# Patient Record
Sex: Male | Born: 1960
Health system: Southern US, Community
[De-identification: ages and names within clinical notes are randomized; demographics above are authoritative.]

## PROBLEM LIST (undated history)

## (undated) DIAGNOSIS — K219 Gastro-esophageal reflux disease without esophagitis: Secondary | ICD-10-CM

## (undated) DIAGNOSIS — H6502 Acute serous otitis media, left ear: Secondary | ICD-10-CM

## (undated) DIAGNOSIS — K222 Esophageal obstruction: Secondary | ICD-10-CM

## (undated) DIAGNOSIS — L409 Psoriasis, unspecified: Secondary | ICD-10-CM

## (undated) DIAGNOSIS — L408 Other psoriasis: Secondary | ICD-10-CM

## (undated) DIAGNOSIS — H6990 Unspecified Eustachian tube disorder, unspecified ear: Secondary | ICD-10-CM

## (undated) DIAGNOSIS — I10 Essential (primary) hypertension: Secondary | ICD-10-CM

## (undated) HISTORY — DX: Essential (primary) hypertension: I10

## (undated) HISTORY — DX: Psoriasis, unspecified: L40.9

## (undated) HISTORY — DX: Unspecified eustachian tube disorder, unspecified ear: H69.90

## (undated) HISTORY — DX: Other psoriasis: L40.8

## (undated) HISTORY — DX: Acute serous otitis media, left ear: H65.02

## (undated) HISTORY — DX: Gastro-esophageal reflux disease without esophagitis: K21.9

## (undated) HISTORY — PX: OTHER SURGICAL HISTORY: SHX169

## (undated) HISTORY — DX: Esophageal obstruction: K22.2

---

## 1999-05-07 ENCOUNTER — Emergency Department (HOSPITAL_COMMUNITY): Admission: EM | Admit: 1999-05-07 | Discharge: 1999-05-07 | Payer: Self-pay | Admitting: Emergency Medicine

## 1999-05-07 ENCOUNTER — Encounter: Payer: Self-pay | Admitting: Emergency Medicine

## 2001-06-27 DIAGNOSIS — K222 Esophageal obstruction: Secondary | ICD-10-CM

## 2001-06-27 HISTORY — DX: Esophageal obstruction: K22.2

## 2001-07-04 ENCOUNTER — Encounter: Payer: Self-pay | Admitting: Gastroenterology

## 2005-12-14 ENCOUNTER — Ambulatory Visit: Payer: Self-pay | Admitting: Internal Medicine

## 2005-12-20 ENCOUNTER — Ambulatory Visit: Payer: Self-pay | Admitting: Internal Medicine

## 2006-01-19 ENCOUNTER — Ambulatory Visit: Payer: Self-pay | Admitting: Internal Medicine

## 2006-02-21 ENCOUNTER — Ambulatory Visit: Payer: Self-pay | Admitting: Internal Medicine

## 2006-03-20 ENCOUNTER — Ambulatory Visit: Payer: Self-pay | Admitting: Internal Medicine

## 2006-07-19 ENCOUNTER — Ambulatory Visit: Payer: Self-pay | Admitting: Internal Medicine

## 2006-10-16 DIAGNOSIS — L408 Other psoriasis: Secondary | ICD-10-CM

## 2006-10-16 DIAGNOSIS — I10 Essential (primary) hypertension: Secondary | ICD-10-CM | POA: Insufficient documentation

## 2006-10-16 DIAGNOSIS — M109 Gout, unspecified: Secondary | ICD-10-CM | POA: Insufficient documentation

## 2006-10-16 HISTORY — DX: Other psoriasis: L40.8

## 2006-10-18 ENCOUNTER — Encounter: Payer: Self-pay | Admitting: Internal Medicine

## 2006-10-18 ENCOUNTER — Ambulatory Visit: Payer: Self-pay | Admitting: Internal Medicine

## 2006-10-18 DIAGNOSIS — K219 Gastro-esophageal reflux disease without esophagitis: Secondary | ICD-10-CM | POA: Insufficient documentation

## 2006-10-18 LAB — CONVERTED CEMR LAB
ALT: 32 units/L (ref 0–40)
AST: 23 units/L (ref 0–37)
BUN: 14 mg/dL (ref 6–23)
CO2: 28 meq/L (ref 19–32)
Calcium: 9 mg/dL (ref 8.4–10.5)
Chloride: 104 meq/L (ref 96–112)
Creatinine, Ser: 0.9 mg/dL (ref 0.4–1.5)
GFR calc Af Amer: 118 mL/min
GFR calc non Af Amer: 97 mL/min
Glucose, Bld: 111 mg/dL — ABNORMAL HIGH (ref 70–99)
Potassium: 4.2 meq/L (ref 3.5–5.1)
Sodium: 140 meq/L (ref 135–145)
Uric Acid, Serum: 7 mg/dL (ref 2.4–7.0)

## 2007-03-07 ENCOUNTER — Ambulatory Visit: Payer: Self-pay | Admitting: Internal Medicine

## 2007-06-11 ENCOUNTER — Emergency Department (HOSPITAL_COMMUNITY): Admission: EM | Admit: 2007-06-11 | Discharge: 2007-06-11 | Payer: Self-pay | Admitting: Family Medicine

## 2007-07-26 ENCOUNTER — Encounter: Payer: Self-pay | Admitting: Internal Medicine

## 2007-08-23 ENCOUNTER — Telehealth: Payer: Self-pay | Admitting: Internal Medicine

## 2007-09-05 ENCOUNTER — Ambulatory Visit: Payer: Self-pay | Admitting: Internal Medicine

## 2007-09-19 ENCOUNTER — Ambulatory Visit: Payer: Self-pay | Admitting: Internal Medicine

## 2007-09-24 LAB — CONVERTED CEMR LAB
ALT: 36 units/L (ref 0–53)
AST: 22 units/L (ref 0–37)
Albumin: 4 g/dL (ref 3.5–5.2)
Alkaline Phosphatase: 41 units/L (ref 39–117)
BUN: 12 mg/dL (ref 6–23)
Bilirubin, Direct: 0.1 mg/dL (ref 0.0–0.3)
CO2: 29 meq/L (ref 19–32)
Calcium: 9.1 mg/dL (ref 8.4–10.5)
Chloride: 105 meq/L (ref 96–112)
Cholesterol: 215 mg/dL (ref 0–200)
Creatinine, Ser: 0.9 mg/dL (ref 0.4–1.5)
Direct LDL: 127.7 mg/dL
GFR calc Af Amer: 117 mL/min
GFR calc non Af Amer: 97 mL/min
Glucose, Bld: 112 mg/dL — ABNORMAL HIGH (ref 70–99)
HDL: 45.1 mg/dL (ref >39.0)
Potassium: 4.3 meq/L (ref 3.5–5.1)
Sodium: 138 meq/L (ref 135–145)
Total Bilirubin: 0.6 mg/dL (ref 0.3–1.2)
Total CHOL/HDL Ratio: 4.8
Total Protein: 6.6 g/dL (ref 6.0–8.3)
Triglycerides: 158 mg/dL — ABNORMAL HIGH (ref 0–149)
Uric Acid, Serum: 6.9 mg/dL (ref 2.4–7.0)
VLDL: 32 mg/dL (ref 0–40)

## 2008-01-02 ENCOUNTER — Ambulatory Visit: Payer: Self-pay | Admitting: Internal Medicine

## 2008-01-03 ENCOUNTER — Encounter (INDEPENDENT_AMBULATORY_CARE_PROVIDER_SITE_OTHER): Payer: Self-pay | Admitting: *Deleted

## 2008-01-22 ENCOUNTER — Telehealth (INDEPENDENT_AMBULATORY_CARE_PROVIDER_SITE_OTHER): Payer: Self-pay | Admitting: *Deleted

## 2008-01-22 ENCOUNTER — Encounter: Payer: Self-pay | Admitting: Internal Medicine

## 2008-01-25 DIAGNOSIS — K222 Esophageal obstruction: Secondary | ICD-10-CM | POA: Insufficient documentation

## 2008-01-28 ENCOUNTER — Ambulatory Visit: Payer: Self-pay | Admitting: Gastroenterology

## 2008-05-19 ENCOUNTER — Telehealth (INDEPENDENT_AMBULATORY_CARE_PROVIDER_SITE_OTHER): Payer: Self-pay | Admitting: *Deleted

## 2008-05-19 ENCOUNTER — Telehealth: Payer: Self-pay | Admitting: Internal Medicine

## 2008-10-17 ENCOUNTER — Telehealth (INDEPENDENT_AMBULATORY_CARE_PROVIDER_SITE_OTHER): Payer: Self-pay | Admitting: *Deleted

## 2008-10-24 ENCOUNTER — Encounter: Payer: Self-pay | Admitting: Internal Medicine

## 2008-10-24 ENCOUNTER — Inpatient Hospital Stay (HOSPITAL_COMMUNITY): Admission: EM | Admit: 2008-10-24 | Discharge: 2008-10-25 | Payer: Self-pay | Admitting: Emergency Medicine

## 2008-10-24 ENCOUNTER — Ambulatory Visit: Payer: Self-pay | Admitting: Internal Medicine

## 2008-10-24 DIAGNOSIS — R079 Chest pain, unspecified: Secondary | ICD-10-CM | POA: Insufficient documentation

## 2008-10-27 ENCOUNTER — Encounter: Payer: Self-pay | Admitting: Internal Medicine

## 2008-11-06 ENCOUNTER — Telehealth (INDEPENDENT_AMBULATORY_CARE_PROVIDER_SITE_OTHER): Payer: Self-pay | Admitting: *Deleted

## 2008-11-12 ENCOUNTER — Ambulatory Visit: Payer: Self-pay | Admitting: Gastroenterology

## 2009-01-19 ENCOUNTER — Telehealth (INDEPENDENT_AMBULATORY_CARE_PROVIDER_SITE_OTHER): Payer: Self-pay | Admitting: *Deleted

## 2009-01-28 ENCOUNTER — Telehealth (INDEPENDENT_AMBULATORY_CARE_PROVIDER_SITE_OTHER): Payer: Self-pay | Admitting: *Deleted

## 2009-02-04 ENCOUNTER — Telehealth: Payer: Self-pay | Admitting: Internal Medicine

## 2010-02-24 ENCOUNTER — Ambulatory Visit: Payer: Self-pay | Admitting: Internal Medicine

## 2010-03-04 ENCOUNTER — Telehealth: Payer: Self-pay | Admitting: Internal Medicine

## 2010-03-04 LAB — CONVERTED CEMR LAB
ALT: 29 units/L (ref 0–53)
AST: 21 units/L (ref 0–37)
Albumin: 4 g/dL (ref 3.5–5.2)
Alkaline Phosphatase: 51 units/L (ref 39–117)
BUN: 13 mg/dL (ref 6–23)
Basophils Absolute: 0.1 10*3/uL (ref 0.0–0.1)
Basophils Relative: 0.8 % (ref 0.0–3.0)
Bilirubin, Direct: 0.1 mg/dL (ref 0.0–0.3)
CO2: 28 meq/L (ref 19–32)
Calcium: 9.2 mg/dL (ref 8.4–10.5)
Chloride: 104 meq/L (ref 96–112)
Cholesterol: 233 mg/dL — ABNORMAL HIGH (ref 0–200)
Creatinine, Ser: 0.8 mg/dL (ref 0.4–1.5)
Direct LDL: 136 mg/dL
Eosinophils Absolute: 0.2 10*3/uL (ref 0.0–0.7)
Eosinophils Relative: 2.6 % (ref 0.0–5.0)
GFR calc non Af Amer: 114.71 mL/min (ref >60)
Glucose, Bld: 121 mg/dL — ABNORMAL HIGH (ref 70–99)
HCT: 38.4 % — ABNORMAL LOW (ref 39.0–52.0)
HDL: 50 mg/dL (ref >39.00)
Hemoglobin: 13.4 g/dL (ref 13.0–17.0)
Lymphocytes Relative: 26.4 % (ref 12.0–46.0)
Lymphs Abs: 1.7 10*3/uL (ref 0.7–4.0)
MCHC: 34.9 g/dL (ref 30.0–36.0)
MCV: 91.8 fL (ref 78.0–100.0)
Monocytes Absolute: 0.5 10*3/uL (ref 0.1–1.0)
Monocytes Relative: 7 % (ref 3.0–12.0)
Neutro Abs: 4.2 10*3/uL (ref 1.4–7.7)
Neutrophils Relative %: 63.2 % (ref 43.0–77.0)
Platelets: 238 10*3/uL (ref 150.0–400.0)
Potassium: 4.7 meq/L (ref 3.5–5.1)
RBC: 4.18 M/uL — ABNORMAL LOW (ref 4.22–5.81)
RDW: 12.5 % (ref 11.5–14.6)
Sodium: 141 meq/L (ref 135–145)
TSH: 1.29 microintl units/mL (ref 0.35–5.50)
Total Bilirubin: 0.3 mg/dL (ref 0.3–1.2)
Total CHOL/HDL Ratio: 5
Total Protein: 6.9 g/dL (ref 6.0–8.3)
Triglycerides: 271 mg/dL — ABNORMAL HIGH (ref 0.0–149.0)
Uric Acid, Serum: 8.2 mg/dL — ABNORMAL HIGH (ref 4.0–7.8)
VLDL: 54.2 mg/dL — ABNORMAL HIGH (ref 0.0–40.0)
WBC: 6.6 10*3/uL (ref 4.5–10.5)

## 2010-03-12 ENCOUNTER — Encounter (INDEPENDENT_AMBULATORY_CARE_PROVIDER_SITE_OTHER): Payer: Self-pay | Admitting: *Deleted

## 2010-04-27 ENCOUNTER — Telehealth (INDEPENDENT_AMBULATORY_CARE_PROVIDER_SITE_OTHER): Payer: Self-pay | Admitting: *Deleted

## 2010-05-05 IMAGING — CT CT ANGIO CHEST
2 of 6 series · 19 of 36 positions shown · IV contrast (APPLIED)
Comparison: None.

CLINICAL DATA: Chest pain.

CT ANGIOGRAPHY CHEST WITH CONTRAST
TECHNIQUE: Multidetector CT imaging of the chest was performed
using the standard protocol during bolus administration of
intravenous contrast. Multiplanar CT image reconstructions
including MIPs were obtained to evaluate the vascular anatomy.
Contrast: 100 ml Zmnipaque-KHH

[Series 8: pulm embolism 1.0 b25f thins · axial · 0.70mm/px · z∈[-306,-43]mm · 18 of 293 slices shown]
[im 15/293  lung]
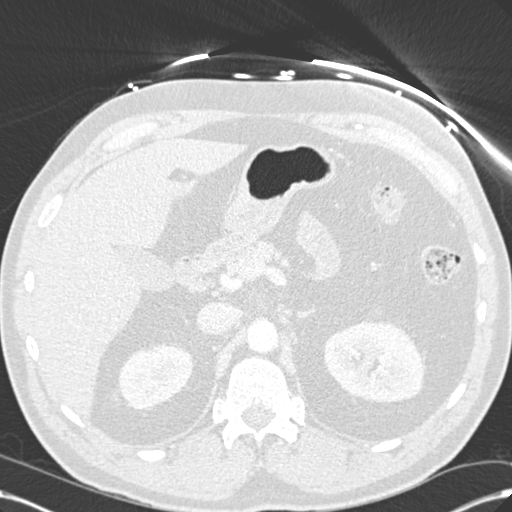
[im 30/293  mediastinal]
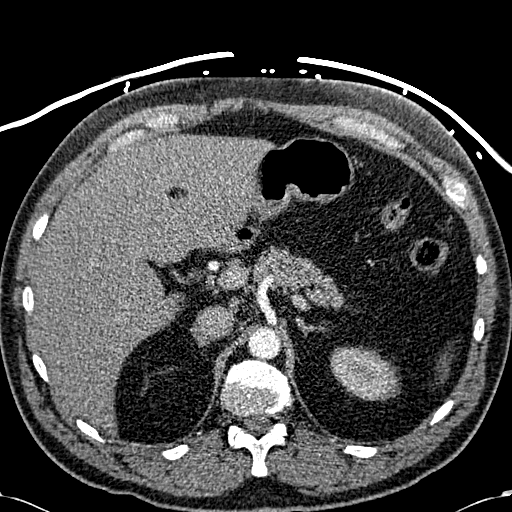
[im 44/293  lung]
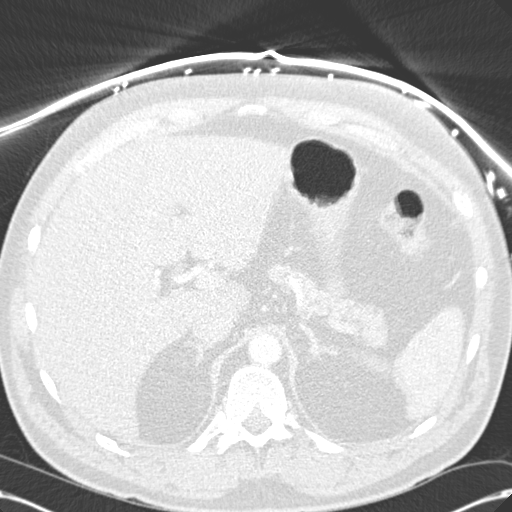
[im 59/293  mediastinal]
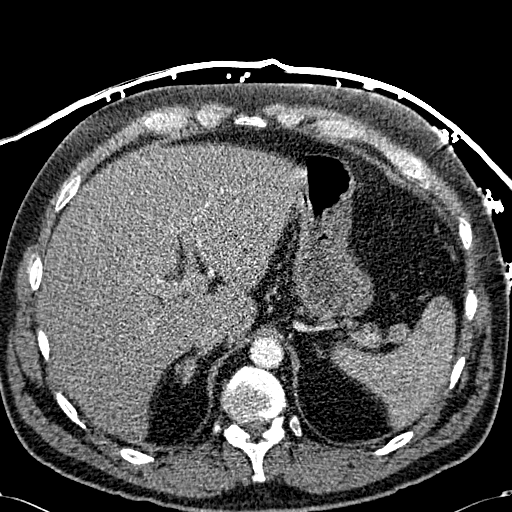
[im 74/293  lung]
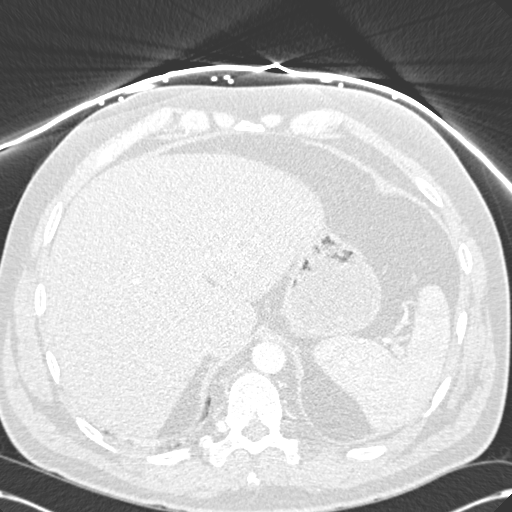
[im 88/293  mediastinal]
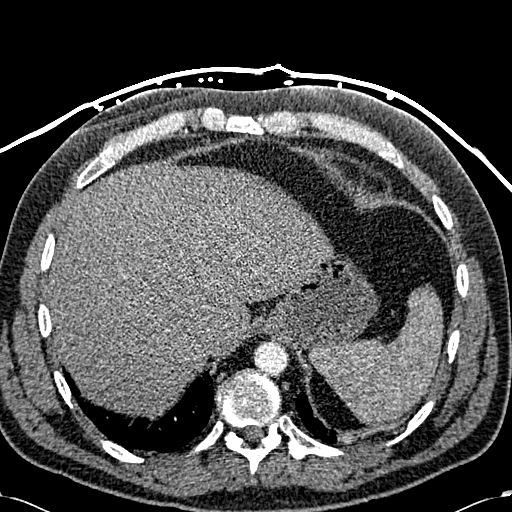
[im 103/293  lung]
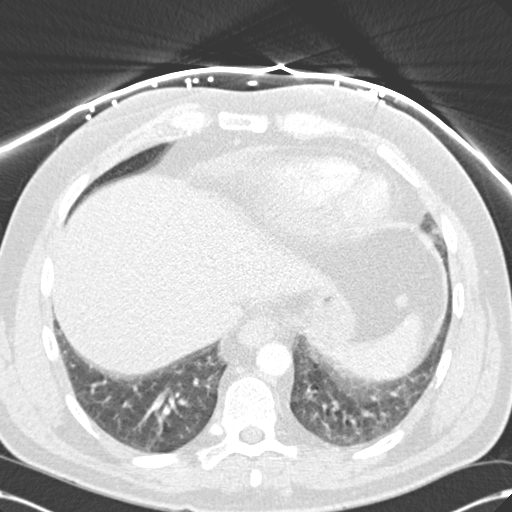
[im 117/293  mediastinal]
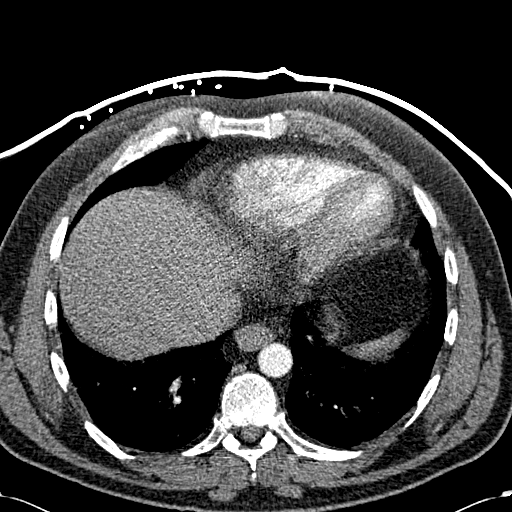
[im 132/293  lung]
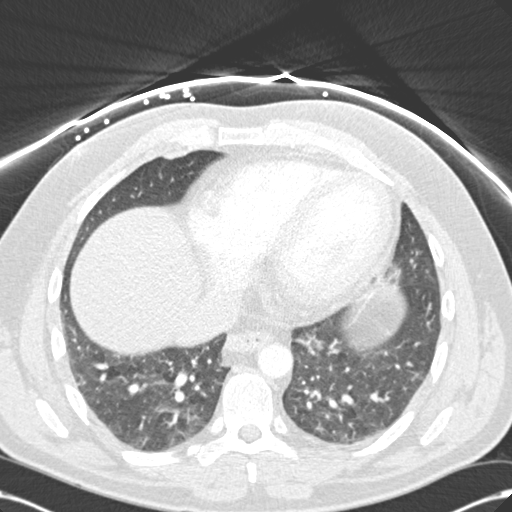
[im 161/293  mediastinal]
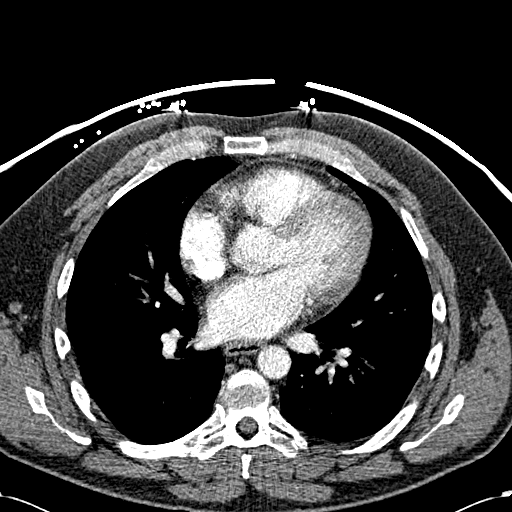
[im 176/293  lung]
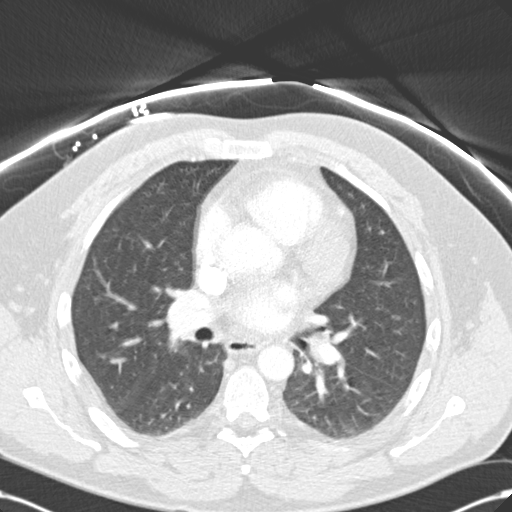
[im 190/293  mediastinal]
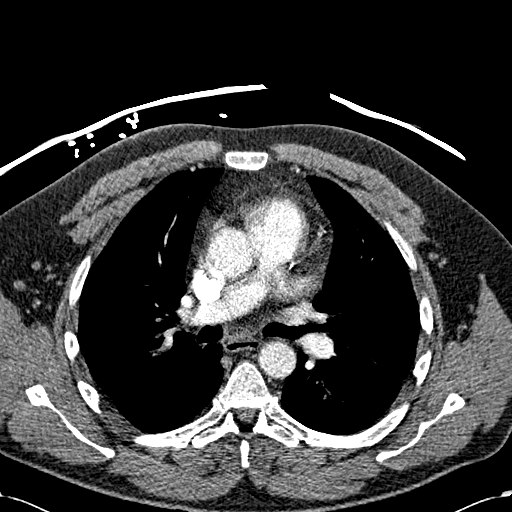
[im 205/293  lung]
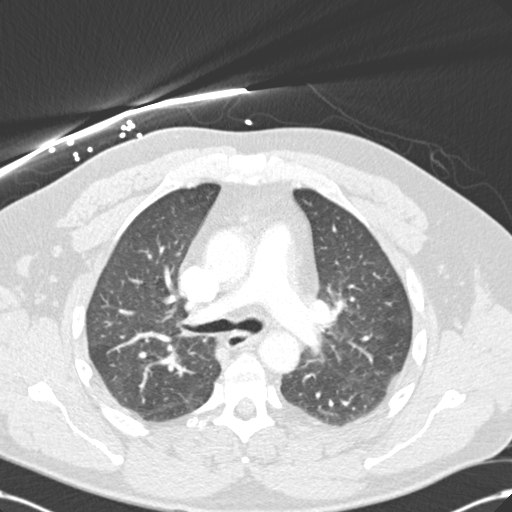
[im 220/293  mediastinal]
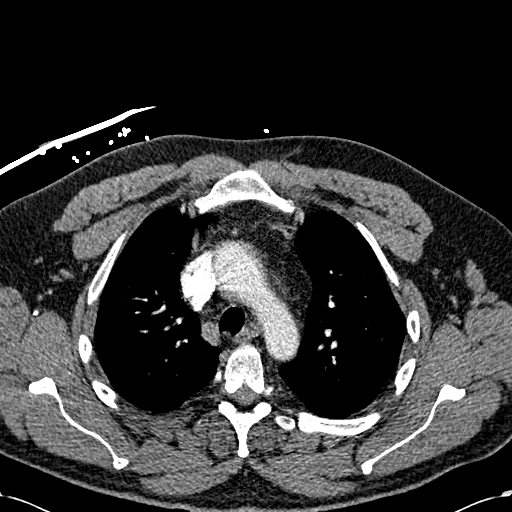
[im 234/293  lung]
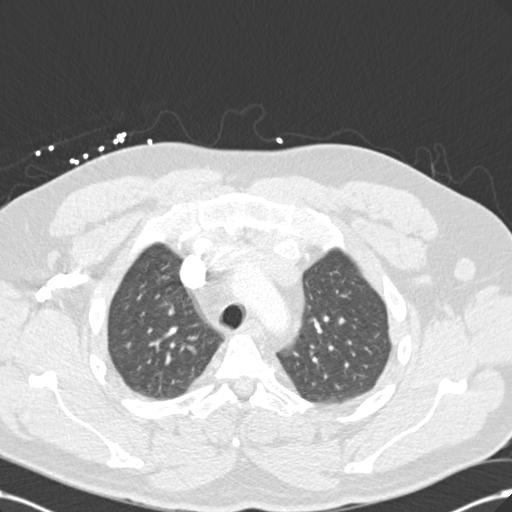
[im 249/293  mediastinal]
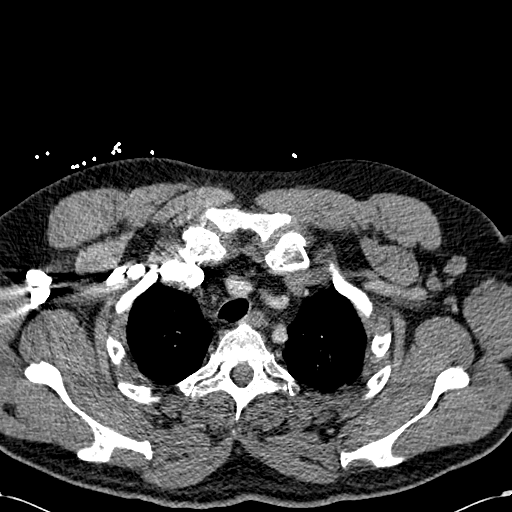
[im 263/293  lung]
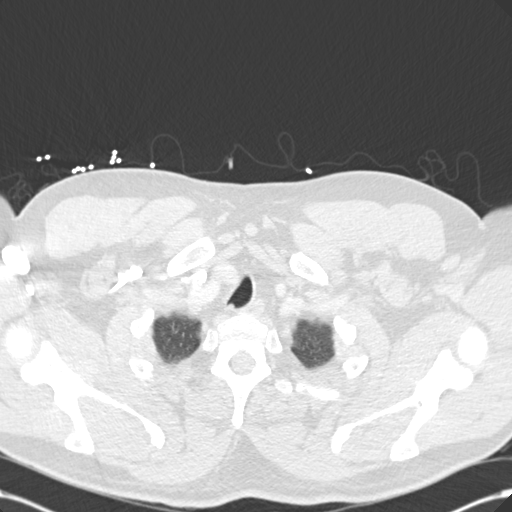
[im 278/293  mediastinal]
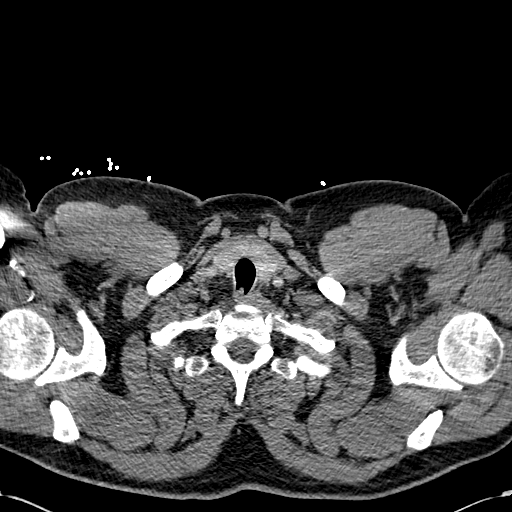

[Series 9: pulm embolism 3.0 spo cor thins · coronal · 0.70mm/px · 1 of 80 slices shown]
[im 40/80  mediastinal]
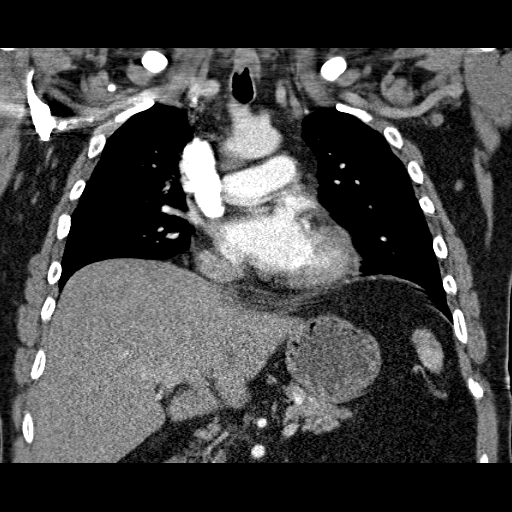

[19 of 36 positions shown; findings below may reference images not displayed]

FINDINGS: There is no filling defect within the opacified
pulmonary arteries to suggest the presence of an acute pulmonary
embolus.  There is no thoracic aortic aneurysm.  No dissection flap
is seen within the thoracic aorta.

No lymphadenopathy in the chest.  Heart is at upper limits of
normal for size.  There is no pericardial or pleural effusion.
Lung windows show no focal airspace consolidation.  There is no
parenchymal nodule or mass.

Bone windows reveal no worrisome lytic or sclerotic osseous
lesions.

Review of the MIP images confirms the above findings.
IMPRESSION: No CT evidence for acute pulmonary embolus.

No CT findings to explain this patient's history of chest pain.

## 2010-06-02 ENCOUNTER — Telehealth (INDEPENDENT_AMBULATORY_CARE_PROVIDER_SITE_OTHER): Payer: Self-pay | Admitting: *Deleted

## 2010-06-09 ENCOUNTER — Ambulatory Visit: Payer: Self-pay | Admitting: Internal Medicine

## 2010-06-11 LAB — CONVERTED CEMR LAB
Cholesterol: 226 mg/dL — ABNORMAL HIGH (ref 0–200)
Total CHOL/HDL Ratio: 5
Triglycerides: 194 mg/dL — ABNORMAL HIGH (ref 0.0–149.0)

## 2010-07-20 ENCOUNTER — Telehealth: Payer: Self-pay | Admitting: Internal Medicine

## 2010-07-27 NOTE — Progress Notes (Signed)
Summary: refill  Phone Note Refill Request Message from:  Fax from Pharmacy on April 27, 2010 9:05 AM  Refills Requested: Medication #1:  METOPROLOL TARTRATE 100 MG TABS one by mouth daily [facility] [address] - fax [phone]  Initial call taken by: Okey Regal Spring,  April 27, 2010 9:05 AM    Prescriptions: METOPROLOL TARTRATE 100 MG TABS (METOPROLOL TARTRATE) one by mouth daily  #90 x 1   Entered by:   Army Fossa CMA   Authorized by:   Nolon Rod. Paz MD   Signed by:   Army Fossa CMA on 04/27/2010   Method used:   [id number]       [address]       [place], [place]  [address]       Ph: [phone] or [phone]       Fax: [phone]   RxID:   [id number]

## 2010-07-27 NOTE — Progress Notes (Signed)
Summary: lab results (lmom 9/8, 9/9)  Phone Note Outgoing Call   Summary of Call: advised patient -uric acid ("gout test") slightly high but he has no problems, continue with the same dose of allopurinol -triglycerides and blood sugar are elevated: recommended a low fat, low carbohydrate diet. Send some information about diet. -needs to recheck  fasting labs in 4 weeks: FLP , blood sugar , A1c--- DX hypertriglyceridemia, hyperglycemia Allissa Albright E. Neil Errickson MD  March 04, 2010 8:24 AM   Follow-up for Phone Call        left message for pt to call back. Army Fossa CMA  March 04, 2010 11:14 AM  [email or web address] Barban  March 05, 2010 11:33 AM  If unable to Communicate next week, please send a letter with all the details Waller Marcussen E. Yevette Knust MD  March 05, 2010 1:41 PM     Additional Follow-up for Phone Call Additional follow up Details #1::        Will mail pt information and letter. Army Fossa CMA  March 12, 2010 10:14 AM

## 2010-07-27 NOTE — Letter (Signed)
Summary: Unable To Reach-Consult Scheduled  [facility]  [address] [place], [place] [address]   Phone: [phone]  Fax: [phone]    03/12/2010 MRN: [id number]    Dear Mr. PRATHER,   We have been unable to reach you by phone.  Please contact our office with an updated phone number.       Thank you,  Army Fossa CMA  March 12, 2010 10:15 AM

## 2010-07-27 NOTE — Assessment & Plan Note (Signed)
Summary: CPX/FASTING//KN/ rescd/cbs   Vital Signs:  Patient profile:   50 year old male Height:      73 inches Weight:      240.13 pounds BMI:     31.80 Pulse rate:   71 / minute Pulse rhythm:   regular BP sitting:   136 / 80  (left arm) Cuff size:   regular  Vitals Entered By: Army Fossa CMA (February 24, 2010 9:20 AM) CC: CPX: fasting   History of Present Illness: CPX last office visit 2 years ago Doing well Hypertension-one out of the hospital 2 months ago, taking metooprolol only once a day. ambulatory BPs within normal gout -taking only one allopurinol , no flareups GERD-discontinue Nexium, on Prilosec p.r.n. Symptoms well controlled, no dysphagia or odynophagia patient has changed his lifestyle, eating healthier, avoiding foods that cause GERD.  Preventive Screening-Counseling & Management  Alcohol-Tobacco     Alcohol drinks/day: <1  Caffeine-Diet-Exercise     Does Patient Exercise: yes  Allergies (verified): No Known Drug Allergies  Past History:  Past Medical History: Reviewed history from 10/24/2008 and no changes required. GERD Gout Hypertension Psoriasis Esophageal Stricture-2003 s/p dilation  Past Surgical History: s/p nasal surgury after facial trauma  Family History: Reviewed history from 09/05/2007 and no changes required. Diabetes--F M Hypertension--F M Lung cancer--GM Melanoma--GF colon ca--no prostate ca--no MI--no  Social History: Reviewed history from 11/12/2008 and no changes required. Married one child work - self employed - Dealer business Never Smoked Alcohol use-yes - 3 days a week  diet-- improved  exercise -- doing some walking  Daily Caffeine Use-2 Illicit Drug Use - no Does Patient Exercise:  yes  Review of Systems General:  Denies fatigue, fever, and weight loss. CV:  Denies chest pain or discomfort, palpitations, and swelling of feet. Resp:  Denies cough and shortness of breath. GI:  Denies bloody  stools, diarrhea, nausea, and vomiting. GU:  Denies dysuria and hematuria. Psych:  Denies anxiety and depression.  Physical Exam  General:  alert and well-developed.   Neck:  no masses, no thyromegaly, and normal carotid upstroke.   Lungs:  normal respiratory effort, no intercostal retractions, no accessory muscle use, and normal breath sounds.   Heart:  normal rate, regular rhythm, no murmur, and no gallop.   Abdomen:  soft, non-tender, normal bowel sounds, no distention, no masses, no guarding, and no rigidity.   Extremities:  no lower extremity edema Neurologic:  alert & oriented X3, strength normal in all extremities, and DTRs symmetrical and normal.   Psych:  Cognition and judgment appear intact. Alert and cooperative with normal attention span and concentration.  not anxious appearing and not depressed appearing.     Impression & Recommendations:  Problem # 1:  HEALTH SCREENING (ICD-V70.0) TD 07 doing great, diet has improved substantially, has decreased his alcohol intake encourage more exercise   Orders: Venipuncture (16109) TLB-BMP (Basic Metabolic Panel-BMET) (80048-METABOL) TLB-CBC Platelet - w/Differential (85025-CBCD) TLB-Lipid Panel (80061-LIPID) TLB-Hepatic/Liver Function Pnl (80076-HEPATIC) TLB-TSH (Thyroid Stimulating Hormone) (84443-TSH) Specimen Handling (60454)  Problem # 2:  GOUT (ICD-274.9) well control with a low dose of allopurinol, likely because he has improved his diet. No change  The following medications were removed from the medication list:    Colchicine 0.6 Mg Tabs (Colchicine) .[name]... 1 by mouth q 4 hours prn His updated medication list for this problem includes:    Allopurinol 100 Mg Tabs (Allopurinol) .[name]... 1 tablet daily    Orders: TLB-Uric Acid, Blood (84550-URIC)  Specimen Handling (19147)  Problem # 3:  HYPERTENSION (ICD-401.9) his BP is well controlled despite not taking benazepril  for 2 months he is only taking one metoprolol at  night with good ambulatory BPs, when she was taking it b.i.d. he felt sluggish We'll continue with present care, advised him to monitor his BP to be sure he is stable with single dose of metoprolol   The following medications were removed from the medication list:    Benazepril Hcl 20 Mg Tabs (Benazepril hcl) .[name]... Take one tablet once daily. His updated medication list for this problem includes:    Metoprolol Tartrate 100 Mg Tabs (Metoprolol tartrate) ..... One by mouth daily  BP today: 136/80 Prior BP: 132/88 (11/12/2008)  Labs Reviewed: K+: 4.3 (09/19/2007) Creat: : 0.9 (09/19/2007)   Chol: 215 (09/19/2007)   HDL: 45.1 (09/19/2007)   LDL: DEL (09/19/2007)   TG: 158 (09/19/2007)  Problem # 4:  GERD (ICD-530.81) Will control with as needed Prilosec His updated medication list for this problem includes:    Prilosec Otc 20 Mg Tbec (Omeprazole magnesium) .[name]... 1 as needed  Complete Medication List: 1)  Allopurinol 100 Mg Tabs (Allopurinol) .[name].. 1 tablet daily 2)  Metoprolol Tartrate 100 Mg Tabs (Metoprolol tartrate) .... One by mouth daily 3)  Prilosec Otc 20 Mg Tbec (Omeprazole magnesium) .[name].. 1 as needed  Other Orders: Admin 1st Vaccine (82956) Flu Vaccine 78yrs + (21308)  Patient Instructions: 1)  Check your blood pressure 2 or 3 times a week. If it is more than 140/85 consistently,please let us know  2)  Please schedule a follow-up appointment in 6 months .  Prescriptions: METOPROLOL TARTRATE 100 MG TABS (METOPROLOL TARTRATE) one by mouth daily  #90 x 2   Entered and Authorized by:   Nolon Rod. Avram Danielson MD   Signed by:   Nolon Rod. Hashem Goynes MD on 02/24/2010   Method used:   [facility]        [id number]       [address]       [place], [place]  [address]       Ph: [phone] or [phone]       Fax: [phone]   RxID:   [id number] ALLOPURINOL 100 MG TABS (ALLOPURINOL) 1 tablet daily  #90 x 2   Entered and Authorized by:   Nolon Rod. Graysen Woodyard MD   Signed by:   Nolon Rod. Mishayla Sliwinski MD on 02/24/2010   Method used:   [facility]        [facility]       [address]       [place], [place]  [address]       Ph: [phone] or [phone]       Fax: [phone]   RxID:   [id number] NEXIUM 40 MG  CPDR (ESOMEPRAZOLE MAGNESIUM) 1 by mouth once daily  #90 x 3   Entered by:   Army Fossa CMA   Authorized by:   Nolon Rod. Ryonna Cimini MD   Signed by:   Army Fossa CMA on 02/24/2010   Method used:   [facility]        [facility]       [address]       [place], [place]  [address]  Ph: [phone] or [phone]       Fax: [phone]   RxID:   [id number] METOPROLOL TARTRATE 100 MG TABS (METOPROLOL TARTRATE) bid  #60 x 1   Entered by:   Army Fossa CMA   Authorized by:   Nolon Rod. Kamica Florance MD   Signed by:   Army Fossa CMA on 02/24/2010   Method used:   [facility]        [facility]       [address]       [place], [place]  [address]       Ph: [phone] or [phone]       Fax: [phone]   RxID:   [id number] BENAZEPRIL HCL 20 MG TABS (BENAZEPRIL HCL) Take one tablet once daily.  #90 x 1   Entered by:   Army Fossa CMA   Authorized by:   Nolon Rod. Shiryl Ruddy MD   Signed by:   Army Fossa CMA on 02/24/2010   Method used:   [facility]        [facility]       [address]       [place], [place]  [address]       Ph: [phone] or [phone]       Fax: [phone]   RxID:   [id number] ALLOPURINOL 100 MG TABS (ALLOPURINOL) 1 tablet by mouth twice a day  #180 x 1   Entered by:   Army Fossa CMA   Authorized by:   Nolon Rod. Apphia Cropley MD   Signed by:   Army Fossa CMA on 02/24/2010   Method used:   [facility]        [facility]       [address]       [place], [place]  [address]       Ph: [phone] or [phone]       Fax: [phone]   RxID:   [id number]    Risk Factors:     Drinks per day:  <1 Exercise:  yes Flu Vaccine Consent Questions     Do you have a history of severe allergic reactions to this vaccine? no    Any prior history of allergic reactions to egg and/or gelatin? no    Do you have a sensitivity to the preservative Thimersol? no    Do you have a past history of Guillan-Barre Syndrome? no    Do you currently have an acute febrile illness? no    Have you ever had a severe reaction to latex? no    Vaccine information given and explained to patient? yes    Are you currently pregnant? no    Lot Number:[id number]   Exp Date:12/25/2010   Site Given  Left Deltoid IMks per day:  <1 Exercise:  yes    .lbflu

## 2010-07-29 NOTE — Progress Notes (Signed)
Summary: needs labs, no further refills--need to verify lab orders  Phone Note Outgoing Call   Summary of Call: was supposed to came back for labs please call one more time or send a letter. No further refills without labs or office visit Jose E. Paz MD  June 02, 2010 1:00 PM    Follow-up for Phone Call        I was having problems with EMR and accidently erased the 2 labs that Dr Drue Novel had entered for this patient--Is the info listed below the correct labs??  He is scheduled to come in on 12/14 for fasting labs.  FLP , blood sugar , A1c--- DX hypertriglyceridemia, hyperglycemia Follow-up by: Jerolyn Shin,  June 04, 2010 12:06 PM  Additional Follow-up for Phone Call Additional follow up Details #1::        I sent you a flag. Lucious Groves CMA  June 04, 2010 1:09 PM     Additional Follow-up for Phone Call Additional follow up Details #2::    As I recall, your flag asked me to combine the two open notes--which I did  but I still don't have a clarification whether labs that were accidentally erased have been entered correctly.  Thanks.Marland KitchenMarland KitchenJerolyn Shin  June 08, 2010 9:09 AM  Per Tresa Endo, referred me to 03/04/2010 labs phone note Follow-up by: Jerolyn Shin,  June 08, 2010 9:16 AM

## 2010-07-29 NOTE — Progress Notes (Signed)
Summary: Refill Request  Phone Note Refill Request Call back at [phone] Message from:  Pharmacy on July 20, 2010 10:26 AM  Refills Requested: Medication #1:  METFORMIN HCL 500 MG TABS one by mouth twice a day for 2 weeks   Dosage confirmed as above?Dosage Confirmed   Supply Requested: 120   Last Refilled: 06/11/2010  Medication #2:  Benazepril HCL 20mg  tab, take one tablet once daily   Supply Requested: 90   [facility] [address]   Next Appointment Scheduled: 3.1.12 Initial call taken by: Harold Barban,  July 20, 2010 10:27 AM  Follow-up for Phone Call        Left message for pt to call back-- need to know if pt is taking Benazepril? Follow-up by: Army Fossa CMA,  July 20, 2010 10:45 AM  Additional Follow-up for Phone Call Additional follow up Details #1::        I spoke with the patient and he confirmed that he is taking Benazepril. Additional Follow-up by: Lucious Groves CMA,  July 20, 2010 11:55 AM    New/Updated Medications: METOPROLOL TARTRATE 100 MG TABS (METOPROLOL TARTRATE) one by mouth daily. NEEDS OFFICE VISIT. BENAZEPRIL HCL 20 MG TABS (BENAZEPRIL HCL) 1 by mouth once daily Prescriptions: BENAZEPRIL HCL 20 MG TABS (BENAZEPRIL HCL) 1 by mouth once daily  #30 x 2   Entered by:   Lucious Groves CMA   Authorized by:   Nolon Rod. Adrain Nesbit MD   Signed by:   Lucious Groves CMA on 07/20/2010   Method used:   [facility] to        [facility]  [address]       [address]       [place], [place]  [address]       Ph: [phone] or [phone]       Fax: [phone]   RxID:   [id number] METFORMIN HCL 500 MG TABS (METFORMIN HCL) one by mouth twice a day for 2 weeks, then 2 by mouth twice a day.  #180 x 0   Entered by:   Army Fossa CMA   Authorized by:   Nolon Rod. Madelin Weseman MD   Signed by:   Army Fossa CMA on 07/20/2010   Method used:   [facility]ically to        [facility]  [address]  [address]       [place], [place]  [address]       Ph: [phone] or [phone]       Fax: [phone]   RxID:   [id number]

## 2010-08-26 ENCOUNTER — Encounter: Payer: Self-pay | Admitting: Internal Medicine

## 2010-08-26 ENCOUNTER — Ambulatory Visit (INDEPENDENT_AMBULATORY_CARE_PROVIDER_SITE_OTHER): Payer: BC Managed Care – PPO | Admitting: Internal Medicine

## 2010-08-26 ENCOUNTER — Other Ambulatory Visit: Payer: Self-pay | Admitting: Internal Medicine

## 2010-08-26 DIAGNOSIS — E119 Type 2 diabetes mellitus without complications: Secondary | ICD-10-CM | POA: Insufficient documentation

## 2010-08-26 LAB — AST: AST: 23 U/L (ref 0–37)

## 2010-08-26 LAB — LIPID PANEL
Cholesterol: 199 mg/dL (ref 0–200)
HDL: 47.4 mg/dL (ref >39.00)
Triglycerides: 167 mg/dL — ABNORMAL HIGH (ref 0.0–149.0)

## 2010-08-26 LAB — ALT: ALT: 34 U/L (ref 0–53)

## 2010-08-31 ENCOUNTER — Telehealth (INDEPENDENT_AMBULATORY_CARE_PROVIDER_SITE_OTHER): Payer: Self-pay | Admitting: *Deleted

## 2010-09-02 NOTE — Assessment & Plan Note (Signed)
Summary: 6 MONTH/CBS   Vital Signs:  Patient profile:   50 year old male Weight:      227.50 pounds Pulse rate:   79 / minute Pulse rhythm:   regular BP sitting:   130 / 84  (left arm) Cuff size:   regular  Vitals Entered By: Army Fossa CMA (August 26, 2010 8:37 AM) CC: 6 month f/u- fasting  Comments no complaints  [facility] [address]    History of Present Illness:  recently diagnosed with diabetes with an A1c of 8.0 He was started on metformin, currently taking 1000 mg twice a day. Has not check his CBGs except for one or 2 times, the last time was 2 weeks ago and it was 113   ROS  occasional diarrhea without nausea or vomiting Diet has improved some, he has done some self education  has not changed his exercise, he remains very active at work  Current Medications (verified): 1)  Allopurinol 100 Mg Tabs (Allopurinol) .[name].. 1 Tablet Daily 2)  Metoprolol Tartrate 100 Mg Tabs (Metoprolol Tartrate) .... One By Mouth Daily. Needs Office Visit. 3)  Prilosec Otc 20 Mg Tbec (Omeprazole Magnesium) .[name].. 1 As Needed 4)  Metformin Hcl 500 Mg Tabs (Metformin Hcl) .... One By Mouth Twice A Day For 2 Weeks, Then 2 By Mouth Twice A Day. 5)  Benazepril Hcl 20 Mg Tabs (Benazepril Hcl) .[name].. 1 By Mouth Once Daily  Allergies (verified): No Known Drug Allergies  Past History:  Past Medical History: Reviewed history from 10/24/2008 and no changes required. GERD Gout Hypertension Psoriasis Esophageal Stricture-2003 s/p dilation  Past Surgical History: Reviewed history from 02/24/2010 and no changes required. s/p nasal surgury after facial trauma  Social History: Reviewed history from 02/24/2010 and no changes required. Married one child work - self employed - Dealer business Never Smoked Alcohol use-yes - 3 days a week  diet-- improved  exercise -- doing some walking  Daily Caffeine Use-2 Illicit Drug Use - no  Physical Exam  General:  alert and well-developed.   has  lost 13 pounds Lungs:  normal respiratory effort, no intercostal retractions, no accessory muscle use, and normal breath sounds.   Heart:  normal rate, regular rhythm, no murmur, and no gallop.     Impression & Recommendations:  Problem # 1:  DM (ICD-250.00)  I discussed with  him this new diagnosis and the implications   we will do a nutritionist referal  recommend to read  DM for Dummies  diet-exercise discussed  adjust meds w/  reults, consider d/c or decrease dose of metformin  also recommend that ophthalmologist eval  His updated medication list for this problem includes:    Metformin Hcl 500 Mg Tabs (Metformin hcl) ..... One by mouth twice a day for 2 weeks, then 2 by mouth twice a day.    Benazepril Hcl 20 Mg Tabs (Benazepril hcl) .[name]... 1 by mouth once daily  Orders: Venipuncture (11914) TLB-A1C / Hgb A1C (Glycohemoglobin) (83036-A1C) TLB-ALT (SGPT) (84460-ALT) TLB-AST (SGOT) (84450-SGOT) TLB-Lipid Panel (80061-LIPID) Specimen Handling (78295) Nutrition Referral (Nutrition)  Complete Medication List: 1)  Allopurinol 100 Mg Tabs (Allopurinol) .[name].. 1 tablet daily 2)  Metoprolol Tartrate 100 Mg Tabs (Metoprolol tartrate) .... One by mouth daily. needs office visit. 3)  Prilosec Otc 20 Mg Tbec (Omeprazole magnesium) .[name].. 1 as needed 4)  Metformin Hcl 500 Mg Tabs (Metformin hcl) .... One by mouth twice a day for 2 weeks, then 2 by mouth twice a  day. 5)  Benazepril Hcl 20 Mg Tabs (Benazepril hcl) .[name].. 1 by mouth once daily  Patient Instructions: 1)  Please schedule a follow-up appointment in 3 months .    Orders Added: 1)  Venipuncture [36415] 2)  TLB-A1C / Hgb A1C (Glycohemoglobin) [83036-A1C] 3)  TLB-ALT (SGPT) [84460-ALT] 4)  TLB-AST (SGOT) [84450-SGOT] 5)  TLB-Lipid Panel [80061-LIPID] 6)  Specimen Handling [99000] 7)  Nutrition Referral [Nutrition] 8)  Est. Patient Level III [42595]

## 2010-09-07 NOTE — Progress Notes (Signed)
Summary: refill  Phone Note Refill Request   Refills Requested: Medication #1:  METFORMIN HCL 500 MG TABS one by mouth twice a day for 2 weeks [facility] [address] [facility] fax [phone]  Initial call taken by: Okey Regal Spring,  August 31, 2010 3:17 PM    Prescriptions: METFORMIN HCL 500 MG TABS (METFORMIN HCL) one by mouth twice a day for 2 weeks, then 2 by mouth twice a day.  #180 x 2   Entered by:   Army Fossa CMA   Authorized by:   Nolon Rod. Paz MD   Signed by:   Army Fossa CMA on 08/31/2010   Method used:   [facility] to        [facility]  [address] [facility]       [address]       [place], [place]  [address]       Ph: [phone] or [phone]       Fax: [phone]   RxID:   [id number]

## 2010-10-05 LAB — CBC
HCT: 37.1 % — ABNORMAL LOW (ref 39.0–52.0)
MCV: 90.7 fL (ref 78.0–100.0)
Platelets: 213 10*3/uL (ref 150–400)
RDW: 12.6 % (ref 11.5–15.5)
WBC: 9.8 10*3/uL (ref 4.0–10.5)

## 2010-10-05 LAB — CK TOTAL AND CKMB (NOT AT ARMC): CK, MB: 0.9 ng/mL (ref 0.3–4.0)

## 2010-10-05 LAB — BASIC METABOLIC PANEL
BUN: 9 mg/dL (ref 6–23)
CO2: 26 mEq/L (ref 19–32)
Chloride: 103 mEq/L (ref 96–112)
Glucose, Bld: 127 mg/dL — ABNORMAL HIGH (ref 70–99)
Potassium: 3.9 mEq/L (ref 3.5–5.1)

## 2010-10-06 LAB — TROPONIN I: Troponin I: 0.02 ng/mL (ref 0.00–0.06)

## 2010-10-06 LAB — CBC
MCHC: 35.6 g/dL (ref 30.0–36.0)
MCV: 90.7 fL (ref 78.0–100.0)
Platelets: 240 10*3/uL (ref 150–400)
RDW: 12.6 % (ref 11.5–15.5)
WBC: 13.6 10*3/uL — ABNORMAL HIGH (ref 4.0–10.5)

## 2010-10-06 LAB — URINALYSIS, ROUTINE W REFLEX MICROSCOPIC
Bilirubin Urine: NEGATIVE
Nitrite: NEGATIVE
Specific Gravity, Urine: 1.009 (ref 1.005–1.030)
pH: 6 (ref 5.0–8.0)

## 2010-10-06 LAB — BASIC METABOLIC PANEL
BUN: 12 mg/dL (ref 6–23)
CO2: 27 mEq/L (ref 19–32)
Chloride: 103 mEq/L (ref 96–112)
Creatinine, Ser: 0.82 mg/dL (ref 0.4–1.5)
Glucose, Bld: 108 mg/dL — ABNORMAL HIGH (ref 70–99)

## 2010-10-06 LAB — RAPID URINE DRUG SCREEN, HOSP PERFORMED
Cocaine: NOT DETECTED
Opiates: NOT DETECTED
Tetrahydrocannabinol: NOT DETECTED

## 2010-10-06 LAB — CK TOTAL AND CKMB (NOT AT ARMC)
CK, MB: 1.4 ng/mL (ref 0.3–4.0)
Relative Index: INVALID (ref 0.0–2.5)
Total CK: 125 U/L (ref 7–232)

## 2010-10-06 LAB — POCT CARDIAC MARKERS
CKMB, poc: <1 ng/mL — ABNORMAL LOW (ref 1.0–8.0)
Troponin i, poc: <0.05 ng/mL (ref 0.00–0.09)

## 2010-10-06 LAB — DIFFERENTIAL
Basophils Relative: 0 % (ref 0–1)
Eosinophils Absolute: 0.1 10*3/uL (ref 0.0–0.7)
Eosinophils Relative: 1 % (ref 0–5)
Monocytes Absolute: 0.8 10*3/uL (ref 0.1–1.0)
Monocytes Relative: 6 % (ref 3–12)

## 2010-10-19 ENCOUNTER — Other Ambulatory Visit: Payer: Self-pay | Admitting: *Deleted

## 2010-10-19 MED ORDER — BENAZEPRIL HCL 20 MG PO TABS: 20.0000 mg | ORAL_TABLET | Freq: Every day | ORAL | Status: DC

## 2010-11-09 NOTE — Discharge Summary (Signed)
NAMEMarland Bush  JAYTEN, GABBARD NO.:  [id number]   MEDICAL RECORD NO.:  [id number]          PATIENT TYPE:  INP   LOCATION:  3732                         FACILITY:  [facility]   PHYSICIAN:  Corwin Levins, MD      DATE OF BIRTH:  04-22-1961   DATE OF ADMISSION:  10/24/2008  DATE OF DISCHARGE:  10/25/2008                               DISCHARGE SUMMARY   DISCHARGE DIAGNOSES:  1. Chest pain myocardial infarction ruled out, question      musculoskeletal versus gastrointestinal.  2. Gastroesophageal reflux disease.  3. History of esophageal stricture.  4. Hypertension.  5. Gout.  6. Psoriasis.   PROCEDURES:  1. CT angiography of the chest without pulmonary embolus, otherwise no      acute disease.  2. Chest x-ray, no acute disease.  3. ECG, sinus rhythm, no acute changes.  4. Telemetry, sinus rhythm.   CONSULTATIONS:  None.   HISTORY AND PHYSICAL:  See documented on the chart by myself on date of  admission.   HOSPITAL COURSE:  Barry Bush is a very nice 50 year old white male  who had presented with unusual chest pain, pleuritic, and tachypneic,  although he had significant symptoms really to gastrointestinal as well.  He was admitted for rule out MI which proved negative with serial  cardiac enzymes.  His telemetry remained sinus rhythm.  ECG without  acute change.  Chest x-ray and CT angiography done as above without  acute change as well.  He was much improved overnight with p.r.n.  Toradol to which he seemed to respond quite nicely.  There were further  labs including glucose of 127 on the day of discharge.  He is  ambulatory, eating well, afebrile, no other acute problems noted.  He  felt again maximum benefit from this hospitalization and is to be  discharged home.  It is presumed that his chest discomfort is most  likely musculoskeletal plus or minus gastrointestinal or both, and he is  to be discharged home with p.r.n. pain medications, increase to Nexium  to  b.i.d. and add Carafate at least temporally as trial.   DISPOSITION:  Discharged to home in good condition.  No activity with  dietary restrictions.  He is to follow up with Dr. Drue Novel in 1 to 2 weeks.  Some consideration might be made for cardiac stress testing.  The  patient himself is also to call on Monday on May 3, to reschedule  Gastroenterology appointment, which he had cancelled previously when you  called he might not need it.   DISCHARGE MEDICATIONS:  1. Darvocet 1 q.6 h. p.r.n.  2. Nexium 100 mg p.o. b.i.d.  3. Carafate 1 g q.i.d.  4. Allopurinol p.o. b.i.d.  5. Colchicine 0.6 mg q.4 h. p.r.n.  6. Benazepril 20 mg 1 p.o. daily.  7. Toprol-XL 200 mg 1 p.o. daily.      Corwin Levins, MD  Electronically Signed     JWJ/MEDQ  D:  10/25/2008  T:  10/26/2008  Job:  [id number]

## 2010-11-25 ENCOUNTER — Encounter: Payer: Self-pay | Admitting: Internal Medicine

## 2010-11-26 ENCOUNTER — Encounter: Payer: Self-pay | Admitting: Internal Medicine

## 2010-11-26 ENCOUNTER — Ambulatory Visit (INDEPENDENT_AMBULATORY_CARE_PROVIDER_SITE_OTHER): Payer: BC Managed Care – PPO | Admitting: Internal Medicine

## 2010-11-26 DIAGNOSIS — E119 Type 2 diabetes mellitus without complications: Secondary | ICD-10-CM

## 2010-11-26 NOTE — Assessment & Plan Note (Signed)
Doing well Glucometer provided See instructions

## 2010-11-26 NOTE — Progress Notes (Signed)
  Subjective:    Patient ID: Barry Bush, male    DOB: Nov 14, 1960, 50 y.o.   MRN: [id number]  HPI Diabetes checkup, doing well.   Past Medical History  Diagnosis Date  . GERD (gastroesophageal reflux disease)   . Gout   . Hypertension   . Psoriasis   . Esophageal stricture 2003    s/p dilation  . Diabetes mellitus 08-2010   Past Surgical History  Procedure Date  . Nose surgery   . Facial trauma       Review of Systems Diet has improved, he has been more active. Does not have a glucometer. As learned more about diabetes and has no questions at this point.    Objective:   Physical Exam  Constitutional: He is oriented to person, place, and time. He appears well-developed and well-nourished. No distress.  HENT:  Head: Normocephalic and atraumatic.  Cardiovascular: Normal rate, regular rhythm and normal heart sounds.   No murmur heard. Pulmonary/Chest: Effort normal and breath sounds normal. No respiratory distress. He has no wheezes. He has no rales.  Musculoskeletal: He exhibits no edema.  Neurological: He is alert and oriented to person, place, and time.  Skin: He is not diaphoretic.  Psychiatric: He has a normal mood and affect. His behavior is normal. Judgment and thought content normal.          Assessment & Plan:

## 2010-11-26 NOTE — Patient Instructions (Signed)
Check your sugars once a day at different times

## 2010-11-29 ENCOUNTER — Telehealth: Payer: Self-pay | Admitting: *Deleted

## 2010-11-29 NOTE — Telephone Encounter (Signed)
Message left for patient to return my call.  

## 2010-11-29 NOTE — Telephone Encounter (Signed)
Message copied by Leanne Lovely on Mon Nov 29, 2010  1:44 PM ------      Message from: Willow Ora E      Created: Mon Nov 29, 2010  1:43 PM       Advise patient, hemoglobin A1c is stable at 6.6

## 2010-11-30 NOTE — Telephone Encounter (Signed)
Pt is aware.  

## 2010-12-07 ENCOUNTER — Other Ambulatory Visit: Payer: Self-pay | Admitting: Internal Medicine

## 2011-01-18 ENCOUNTER — Other Ambulatory Visit: Payer: Self-pay | Admitting: Internal Medicine

## 2011-01-18 MED ORDER — BENAZEPRIL HCL 20 MG PO TABS: 20.0000 mg | ORAL_TABLET | Freq: Every day | ORAL | Status: DC

## 2011-01-18 MED ORDER — METFORMIN HCL 500 MG PO TABS: 1000.0000 mg | ORAL_TABLET | Freq: Two times a day (BID) | ORAL | Status: DC

## 2011-01-18 NOTE — Telephone Encounter (Signed)
Sent in

## 2011-03-28 ENCOUNTER — Encounter: Payer: Self-pay | Admitting: Internal Medicine

## 2011-03-28 ENCOUNTER — Ambulatory Visit (INDEPENDENT_AMBULATORY_CARE_PROVIDER_SITE_OTHER): Payer: BC Managed Care – PPO | Admitting: Internal Medicine

## 2011-03-28 DIAGNOSIS — M109 Gout, unspecified: Secondary | ICD-10-CM

## 2011-03-28 DIAGNOSIS — E119 Type 2 diabetes mellitus without complications: Secondary | ICD-10-CM

## 2011-03-28 DIAGNOSIS — Z23 Encounter for immunization: Secondary | ICD-10-CM

## 2011-03-28 DIAGNOSIS — I10 Essential (primary) hypertension: Secondary | ICD-10-CM

## 2011-03-28 LAB — BASIC METABOLIC PANEL
CO2: 27 mEq/L (ref 19–32)
Calcium: 9.2 mg/dL (ref 8.4–10.5)
Chloride: 101 mEq/L (ref 96–112)
Creatinine, Ser: 0.8 mg/dL (ref 0.4–1.5)
Glucose, Bld: 114 mg/dL — ABNORMAL HIGH (ref 70–99)

## 2011-03-28 LAB — LIPID PANEL
Cholesterol: 218 mg/dL — ABNORMAL HIGH (ref 0–200)
HDL: 51.7 mg/dL (ref >39.00)
Total CHOL/HDL Ratio: 4
VLDL: 33.4 mg/dL (ref 0.0–40.0)

## 2011-03-28 NOTE — Progress Notes (Signed)
  Subjective:    Patient ID: Barry Bush, male    DOB: 1960/12/05, 50 y.o.   MRN: [id number]  HPI ROV Diabetes--  appropriate questions asked about this dx,  meds -labs reviewed , see documentation in the assessment an plan  Gout-- appropriate questions asked ,  meds -labs reviewed , see documentation in the assessment an plan  Hypertension--  appropriate questions asked about this dx,  meds -labs reviewed , see documentation in the assessment an plan   Past Medical History  Diagnosis Date  . GERD (gastroesophageal reflux disease)   . Gout   . Hypertension   . Psoriasis   . Esophageal stricture 2003    s/p dilation  . Diabetes mellitus 08-2010    Past Surgical History  Procedure Date  . Nose surgery   . Facial trauma      Review of Systems Not nausea or vomiting, rarely has diarrhea without blood in the stools. No myalgias No chest pain or shortness of breath.    Objective:   Physical Exam  Constitutional: He is oriented to person, place, and time. He appears well-developed and well-nourished. No distress.  HENT:  Head: Normocephalic and atraumatic.  Cardiovascular: Normal rate and regular rhythm.   No murmur heard. Pulmonary/Chest: Effort normal and breath sounds normal. No respiratory distress. He has no wheezes. He has no rales.  Musculoskeletal:       DIABETIC FEET EXAM: No lower extremity edema Normal pedal pulses bilaterally Skin and nails are normal without calluses Pinprick examination of the feet normal.   Neurological: He is alert and oriented to person, place, and time.  Skin: He is not diaphoretic.  Psychiatric: He has a normal mood and affect. His behavior is normal. Judgment and thought content normal.          Assessment & Plan:

## 2011-03-28 NOTE — Assessment & Plan Note (Signed)
Dx 3-12 Eye checked ~ 4-12 Feet  exam normal Good medication compliance, ambulatory blood sugars are 113 when checked. Labs

## 2011-03-28 NOTE — Assessment & Plan Note (Signed)
Good medication compliance, no recent attacks. Labs

## 2011-03-28 NOTE — Assessment & Plan Note (Signed)
Good medication compliance and no recent ambulatory blood pressures. BP today normal. Labs, no change

## 2011-03-31 MED ORDER — ATORVASTATIN CALCIUM 10 MG PO TABS: 10.0000 mg | ORAL_TABLET | Freq: Every day | ORAL | Status: DC

## 2011-03-31 NOTE — Progress Notes (Signed)
Addended by: Court Joy on: 03/31/2011 11:05 AM   Modules accepted: Orders

## 2011-04-01 LAB — CBC
MCHC: 34.8
Platelets: 323
RBC: 4.39

## 2011-04-01 LAB — DIFFERENTIAL
Basophils Relative: 1
Eosinophils Absolute: 0.2
Eosinophils Relative: 1
Monocytes Absolute: 1.5 — ABNORMAL HIGH
Monocytes Relative: 13 — ABNORMAL HIGH
Neutro Abs: 8.2 — ABNORMAL HIGH

## 2011-04-12 ENCOUNTER — Other Ambulatory Visit: Payer: Self-pay | Admitting: Internal Medicine

## 2011-04-12 MED ORDER — BENAZEPRIL HCL 20 MG PO TABS: 20.0000 mg | ORAL_TABLET | Freq: Every day | ORAL | Status: DC

## 2011-04-12 NOTE — Telephone Encounter (Signed)
Done

## 2011-04-18 ENCOUNTER — Other Ambulatory Visit: Payer: Self-pay | Admitting: Internal Medicine

## 2011-04-18 MED ORDER — METFORMIN HCL 500 MG PO TABS: 1000.0000 mg | ORAL_TABLET | Freq: Two times a day (BID) | ORAL | Status: DC

## 2011-04-18 NOTE — Telephone Encounter (Signed)
Done

## 2011-04-20 ENCOUNTER — Other Ambulatory Visit: Payer: Self-pay | Admitting: Internal Medicine

## 2011-04-22 MED ORDER — BENAZEPRIL HCL 20 MG PO TABS: 20.0000 mg | ORAL_TABLET | Freq: Every day | ORAL | Status: DC

## 2011-04-22 NOTE — Telephone Encounter (Signed)
done

## 2011-04-26 ENCOUNTER — Other Ambulatory Visit: Payer: Self-pay | Admitting: Internal Medicine

## 2011-04-26 MED ORDER — METFORMIN HCL 500 MG PO TABS: 1000.0000 mg | ORAL_TABLET | Freq: Two times a day (BID) | ORAL | Status: DC

## 2011-04-26 NOTE — Telephone Encounter (Signed)
Done

## 2011-05-26 ENCOUNTER — Other Ambulatory Visit: Payer: Self-pay

## 2011-05-26 MED ORDER — METFORMIN HCL 500 MG PO TABS: 1000.0000 mg | ORAL_TABLET | Freq: Two times a day (BID) | ORAL | Status: DC

## 2011-05-26 NOTE — Telephone Encounter (Signed)
Pt's wife called. Wrong quantity sent to pharmacy for metformin.   Rx corrected and sent to pharmacy

## 2011-06-23 ENCOUNTER — Other Ambulatory Visit: Payer: Self-pay | Admitting: Internal Medicine

## 2011-07-25 ENCOUNTER — Other Ambulatory Visit: Payer: Self-pay | Admitting: Internal Medicine

## 2011-07-26 MED ORDER — BENAZEPRIL HCL 20 MG PO TABS: 20.0000 mg | ORAL_TABLET | Freq: Every day | ORAL | Status: DC

## 2011-07-26 NOTE — Telephone Encounter (Signed)
Refill done.  

## 2011-07-29 ENCOUNTER — Encounter: Payer: BC Managed Care – PPO | Admitting: Internal Medicine

## 2011-08-03 ENCOUNTER — Encounter: Payer: Self-pay | Admitting: Internal Medicine

## 2011-08-03 ENCOUNTER — Ambulatory Visit (INDEPENDENT_AMBULATORY_CARE_PROVIDER_SITE_OTHER): Payer: BC Managed Care – PPO | Admitting: Internal Medicine

## 2011-08-03 DIAGNOSIS — I10 Essential (primary) hypertension: Secondary | ICD-10-CM

## 2011-08-03 DIAGNOSIS — E119 Type 2 diabetes mellitus without complications: Secondary | ICD-10-CM

## 2011-08-03 DIAGNOSIS — E785 Hyperlipidemia, unspecified: Secondary | ICD-10-CM

## 2011-08-03 DIAGNOSIS — Z0000 Encounter for general adult medical examination without abnormal findings: Secondary | ICD-10-CM

## 2011-08-03 LAB — BASIC METABOLIC PANEL
BUN: 18 mg/dL (ref 6–23)
Chloride: 104 mEq/L (ref 96–112)
Potassium: 4.3 mEq/L (ref 3.5–5.1)

## 2011-08-03 LAB — TSH: TSH: 1.26 u[IU]/mL (ref 0.35–5.50)

## 2011-08-03 LAB — CBC WITH DIFFERENTIAL/PLATELET
Basophils Relative: 0.5 % (ref 0.0–3.0)
Eosinophils Relative: 3.6 % (ref 0.0–5.0)
HCT: 38.3 % — ABNORMAL LOW (ref 39.0–52.0)
Lymphs Abs: 1.7 10*3/uL (ref 0.7–4.0)
MCV: 91.9 fl (ref 78.0–100.0)
Monocytes Absolute: 0.6 10*3/uL (ref 0.1–1.0)
Platelets: 235 10*3/uL (ref 150.0–400.0)
RBC: 4.17 Mil/uL — ABNORMAL LOW (ref 4.22–5.81)
WBC: 7.1 10*3/uL (ref 4.5–10.5)

## 2011-08-03 LAB — LIPID PANEL
LDL Cholesterol: 85 mg/dL (ref 0–99)
Total CHOL/HDL Ratio: 3

## 2011-08-03 LAB — ALT: ALT: 37 U/L (ref 0–53)

## 2011-08-03 NOTE — Assessment & Plan Note (Signed)
Started  lipitor base on last FLP, labs

## 2011-08-03 NOTE — Progress Notes (Signed)
  Subjective:    Patient ID: Barry Bush, male    DOB: 1960-09-21, 51 y.o.   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 GERD symptoms well controlled with medicines. He checks his blood sugars rarely, last time it was 144. He had a flu shot already Denies chest pain, shortness of breath. No nausea, vomiting, diarrhea. No blood in the stools. Occasionally has a loose bowel movement but no constipation. Denies any anxiety or depression. No difficulty urinating or gross hematuria.     Objective:   Physical Exam  Constitutional: He is oriented to person, place, and time. He appears well-developed and well-nourished. No distress.  HENT:  Head: Normocephalic and atraumatic.  Neck: No thyromegaly present.  Cardiovascular: Normal rate and regular rhythm.   No murmur heard. Pulmonary/Chest: Effort normal and breath sounds normal. No respiratory distress. He has no wheezes. He has no rales.  Abdominal: Soft. Bowel sounds are normal. He exhibits no distension. There is no tenderness. There is no rebound and no guarding.  Musculoskeletal: He exhibits no edema.  Neurological: He is alert and oriented to person, place, and time.  Skin: Skin is warm and dry. He is not diaphoretic.  Psychiatric: He has a normal mood and affect. His behavior is normal. Judgment and thought content  normal.      Assessment & Plan:

## 2011-08-03 NOTE — Assessment & Plan Note (Signed)
No change, see instructions 

## 2011-08-03 NOTE — Patient Instructions (Signed)
Check the  blood pressure 2 or 3 times a week, be sure it is less than 140/85. If it is consistently higher, let me know  

## 2011-08-03 NOTE — Assessment & Plan Note (Signed)
Td 07 Never had a cscope, no FH Labs and EKG diet-exercise discussed

## 2011-08-08 ENCOUNTER — Encounter: Payer: Self-pay | Admitting: *Deleted

## 2011-08-21 ENCOUNTER — Other Ambulatory Visit: Payer: Self-pay | Admitting: Internal Medicine

## 2011-08-22 NOTE — Telephone Encounter (Signed)
Refill done.  

## 2011-09-02 ENCOUNTER — Other Ambulatory Visit: Payer: Self-pay | Admitting: Internal Medicine

## 2011-09-02 NOTE — Telephone Encounter (Signed)
Prescription sent to pharmacy.

## 2011-11-14 ENCOUNTER — Other Ambulatory Visit: Payer: Self-pay | Admitting: Internal Medicine

## 2011-11-14 NOTE — Telephone Encounter (Signed)
Refill done.  

## 2011-11-17 ENCOUNTER — Telehealth: Payer: Self-pay | Admitting: Internal Medicine

## 2011-11-17 MED ORDER — METFORMIN HCL 500 MG PO TABS: 1000.0000 mg | ORAL_TABLET | Freq: Two times a day (BID) | ORAL | Status: DC

## 2011-11-17 NOTE — Telephone Encounter (Signed)
Refill: Metformin HCL 500mg  tablet. 90 day supply

## 2011-11-17 NOTE — Telephone Encounter (Signed)
Refilled metformin.  Atorvastatin does not need refills at this time. Confirmed with pharmacy.

## 2011-11-17 NOTE — Telephone Encounter (Signed)
Atorvastatin 10mg  tablet. 90 day supply.

## 2011-12-28 ENCOUNTER — Telehealth: Payer: Self-pay | Admitting: Internal Medicine

## 2011-12-28 MED ORDER — BENAZEPRIL HCL 20 MG PO TABS: 20.0000 mg | ORAL_TABLET | Freq: Every day | ORAL | Status: DC

## 2011-12-28 NOTE — Telephone Encounter (Signed)
Refill: Benazepril hcl 20 mg tablet. Take 1 tablet by mouth daily. Qty 90. *Pt switched to [facility]*

## 2011-12-28 NOTE — Telephone Encounter (Signed)
Refill done.  

## 2012-01-02 ENCOUNTER — Ambulatory Visit: Payer: BC Managed Care – PPO | Admitting: Internal Medicine

## 2012-01-07 ENCOUNTER — Other Ambulatory Visit: Payer: Self-pay | Admitting: Internal Medicine

## 2012-01-19 ENCOUNTER — Telehealth: Payer: Self-pay | Admitting: Internal Medicine

## 2012-01-19 NOTE — Telephone Encounter (Signed)
Refill: Benazepril hcl 20mg  tablet. Take 1 tablet by mouth daily. Qty 90. Last fill 10-20-11

## 2012-01-19 NOTE — Telephone Encounter (Signed)
Spoke with pharmacy & pt still has refills left on file.  

## 2012-02-23 ENCOUNTER — Encounter: Payer: Self-pay | Admitting: Internal Medicine

## 2012-02-23 ENCOUNTER — Ambulatory Visit (INDEPENDENT_AMBULATORY_CARE_PROVIDER_SITE_OTHER): Payer: BC Managed Care – PPO | Admitting: Internal Medicine

## 2012-02-23 VITALS — BP 126/84 | HR 68 | Temp 98.0°F | Wt 224.0 lb

## 2012-02-23 DIAGNOSIS — I10 Essential (primary) hypertension: Secondary | ICD-10-CM

## 2012-02-23 DIAGNOSIS — E785 Hyperlipidemia, unspecified: Secondary | ICD-10-CM

## 2012-02-23 DIAGNOSIS — M109 Gout, unspecified: Secondary | ICD-10-CM

## 2012-02-23 DIAGNOSIS — E119 Type 2 diabetes mellitus without complications: Secondary | ICD-10-CM

## 2012-02-23 LAB — BASIC METABOLIC PANEL
Calcium: 9 mg/dL (ref 8.4–10.5)
Creatinine, Ser: 0.9 mg/dL (ref 0.4–1.5)

## 2012-02-23 LAB — URIC ACID: Uric Acid, Serum: 7.3 mg/dL (ref 4.0–7.8)

## 2012-02-23 LAB — AST: AST: 22 U/L (ref 0–37)

## 2012-02-23 LAB — HEMOGLOBIN A1C: Hgb A1c MFr Bld: 6.2 % (ref 4.6–6.5)

## 2012-02-23 LAB — ALT: ALT: 29 U/L (ref 0–53)

## 2012-02-23 NOTE — Assessment & Plan Note (Signed)
Good medication compliance with metformin and ACE inhibitors Reports a negative eye check few weeks ago Feet exam today normal labs

## 2012-02-23 NOTE — Patient Instructions (Addendum)
Next visit by February 2014 , fasting for a physical exam

## 2012-02-23 NOTE — Progress Notes (Signed)
  Subjective:    Patient ID: Barry Bush, male    DOB: 1961/04/27, 51 y.o.   MRN: [id number]  HPI Routine office visit Good medication compliance with diabetes and BP medications. CBGs around 118. Normal amb BPs. Self decreased allopurinol from 2 tablets a day to one tablet daily. 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 Reports a normal eye exam few weeks ago.    Objective:   Physical Exam  Alert oriented x3, no apparent distress, well-nourished. DIABETIC FEET EXAM: No lower extremity edema Normal pedal pulses bilaterally Skin and nails are normal without calluses Pinprick examination of the feet normal.    Assessment & Plan:

## 2012-02-23 NOTE — Assessment & Plan Note (Signed)
Well-controlled, no change, check a BMP 

## 2012-02-23 NOTE — Assessment & Plan Note (Signed)
Excellent response to cholesterol medication, continue with Lipitor. No change

## 2012-02-23 NOTE — Assessment & Plan Note (Signed)
No recent episodes, has decrease allopurinol to 1 tablet daily. Check a uric acid

## 2012-02-28 ENCOUNTER — Encounter: Payer: Self-pay | Admitting: *Deleted

## 2012-04-09 ENCOUNTER — Other Ambulatory Visit: Payer: Self-pay | Admitting: Internal Medicine

## 2012-04-09 MED ORDER — METOPROLOL TARTRATE 100 MG PO TABS: 100.0000 mg | ORAL_TABLET | Freq: Two times a day (BID) | ORAL | Status: DC

## 2012-04-09 NOTE — Telephone Encounter (Signed)
refill Metoprolol TART 100MG  Tab #90 last fill 7.18.13  Take one tablet by mouth daily last ov 8.29.13 75-month f/u

## 2012-04-09 NOTE — Telephone Encounter (Signed)
Refill done.  

## 2012-04-16 ENCOUNTER — Other Ambulatory Visit: Payer: Self-pay

## 2012-04-16 MED ORDER — METOPROLOL TARTRATE 100 MG PO TABS: 100.0000 mg | ORAL_TABLET | Freq: Every day | ORAL | Status: DC

## 2012-04-23 ENCOUNTER — Telehealth: Payer: Self-pay | Admitting: Internal Medicine

## 2012-04-23 MED ORDER — METFORMIN HCL 500 MG PO TABS: 1000.0000 mg | ORAL_TABLET | Freq: Two times a day (BID) | ORAL | Status: DC

## 2012-04-23 NOTE — Telephone Encounter (Signed)
Refill: Metformin hcl 500 mg tablet. Take 2 tablets by mouth twice a day with meals. Qty 120. Last fill 03-26-12

## 2012-04-23 NOTE — Telephone Encounter (Signed)
Refill done.  

## 2012-05-15 ENCOUNTER — Other Ambulatory Visit: Payer: Self-pay | Admitting: Internal Medicine

## 2012-05-15 NOTE — Telephone Encounter (Signed)
Refill done.  

## 2012-07-16 ENCOUNTER — Other Ambulatory Visit: Payer: Self-pay | Admitting: Internal Medicine

## 2012-07-16 MED ORDER — BENAZEPRIL HCL 20 MG PO TABS: 20.0000 mg | ORAL_TABLET | Freq: Every day | ORAL | Status: DC

## 2012-07-16 NOTE — Telephone Encounter (Signed)
Refill done.  

## 2012-07-16 NOTE — Telephone Encounter (Signed)
refill Benazepril HCl (Tab) 20 MG Take 1 tablet (20 mg total) by mouth daily. #90 wt/1-refill last fill 10.24.13

## 2012-08-07 ENCOUNTER — Encounter: Payer: Self-pay | Admitting: Internal Medicine

## 2012-08-07 ENCOUNTER — Other Ambulatory Visit: Payer: Self-pay | Admitting: Internal Medicine

## 2012-08-07 ENCOUNTER — Ambulatory Visit (INDEPENDENT_AMBULATORY_CARE_PROVIDER_SITE_OTHER): Payer: BC Managed Care – PPO | Admitting: Internal Medicine

## 2012-08-07 VITALS — BP 122/84 | HR 75 | Temp 98.1°F | Ht 73.0 in | Wt 234.0 lb

## 2012-08-07 DIAGNOSIS — Z0000 Encounter for general adult medical examination without abnormal findings: Secondary | ICD-10-CM

## 2012-08-07 DIAGNOSIS — E119 Type 2 diabetes mellitus without complications: Secondary | ICD-10-CM

## 2012-08-07 DIAGNOSIS — M109 Gout, unspecified: Secondary | ICD-10-CM

## 2012-08-07 LAB — CBC WITH DIFFERENTIAL/PLATELET
Basophils Absolute: 0 10*3/uL (ref 0.0–0.1)
Eosinophils Absolute: 0.2 10*3/uL (ref 0.0–0.7)
Hemoglobin: 13 g/dL (ref 13.0–17.0)
Lymphocytes Relative: 23.4 % (ref 12.0–46.0)
MCHC: 34.2 g/dL (ref 30.0–36.0)
MCV: 90 fl (ref 78.0–100.0)
Monocytes Absolute: 0.5 10*3/uL (ref 0.1–1.0)
Neutro Abs: 4.6 10*3/uL (ref 1.4–7.7)
RDW: 12.4 % (ref 11.5–14.6)

## 2012-08-07 LAB — COMPREHENSIVE METABOLIC PANEL
ALT: 30 U/L (ref 0–53)
AST: 22 U/L (ref 0–37)
Creatinine, Ser: 0.8 mg/dL (ref 0.4–1.5)
GFR: 113.55 mL/min (ref >60.00)
Total Bilirubin: 0.5 mg/dL (ref 0.3–1.2)

## 2012-08-07 LAB — LIPID PANEL
Cholesterol: 147 mg/dL (ref 0–200)
HDL: 42.7 mg/dL (ref >39.00)
LDL Cholesterol: 87 mg/dL (ref 0–99)
Total CHOL/HDL Ratio: 3
Triglycerides: 89 mg/dL (ref 0.0–149.0)

## 2012-08-07 NOTE — Telephone Encounter (Signed)
Refill done.  

## 2012-08-07 NOTE — Assessment & Plan Note (Addendum)
Td 07 ---Never had a cscope, no FH, discussed  colonoscopy versus I FOB --->  Provided iFOB ---Multiple cardiovascular risk factors,+ FH; he is asymptomatic, EKGs before  have been normal. Risk discussed with patient, in addition to keep his chronic medical problem is well-controlled I recommend gradual return to exercise and a healthy diet. --- start ASA ---Labs

## 2012-08-07 NOTE — Progress Notes (Signed)
  Subjective:    Patient ID: Barry Bush, male    DOB: 1960-09-05, 52 y.o.   MRN: [id number]  HPI Physical exam  Past Medical History  Diagnosis Date  . GERD (gastroesophageal reflux disease)   . Gout   . Hypertension   . Psoriasis   . Esophageal stricture 2003    s/p dilation  . Diabetes mellitus 08-2010   Past Surgical History  Procedure Laterality Date  . Facial trauma     History   Social History  . Marital Status: Married    Spouse Name: N/A    Number of Children: 1  . Years of Education: N/A   Occupational History  . 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     Social History Main Topics  . Smoking status: Never Smoker   . Smokeless tobacco: Never Used  . Alcohol Use: Yes     Comment: 3 days a week  . Drug Use: No  . Sexually Active: Not on file   Other Topics Concern  . Not on file   Social History Narrative   Diet: fair----   Exercise:  Active at work   Family History  Problem Relation Age of Onset  . Diabetes Father   . Diabetes Mother   . Lung cancer      GM  . Melanoma      GF  . Colon cancer Neg Hx   . Prostate cancer Neg Hx   . Heart attack Father     2 stents 2012, onset at age 43    Review of Systems Medication list reviewed, good medication compliance, ambulatory BPs and sugars checked infrequently but usually within normal, blood sugars  less than 118. Denies chest pain, shortness of breath No nausea, vomiting, diarrhea or blood in the stools. No dysuria gross hematuria. Some decrease in sex drive her last few months, no erectile dysfunction    Objective:   Physical Exam  General -- alert, well-developed, BMI 30.   Neck --no thyromegaly , normal carotid pulse Lungs -- normal respiratory effort, no intercostal retractions, no accessory muscle use, and normal breath sounds.   Heart-- normal rate, regular rhythm, no murmur, and no gallop.   Abdomen--soft, non-tender, no distention, no masses, no HSM, no guarding, and no  rigidity.   Extremities-- no pretibial edema bilaterally Rectal-- No external abnormalities noted. Normal sphincter tone. No rectal masses or tenderness. Brown stool, Hemoccult negative Prostate:  Prostate gland firm and smooth, no enlargement, nodularity, tenderness, mass, asymmetry or induration. Neurologic-- alert & oriented X3 and strength normal in all extremities. Psych-- Cognition and judgment appear intact. Alert and cooperative with normal attention span and concentration.  not anxious appearing and not depressed appearing.       Assessment & Plan:

## 2012-08-07 NOTE — Patient Instructions (Addendum)
Exercise goal: 30 minutes every day, start a routine exercise gradually. Please come back in 4 months for a routine checkup. See the eye doctor yearly for a diabetes eye check

## 2012-08-09 ENCOUNTER — Other Ambulatory Visit: Payer: Self-pay | Admitting: Internal Medicine

## 2012-08-13 ENCOUNTER — Encounter: Payer: Self-pay | Admitting: *Deleted

## 2012-08-14 ENCOUNTER — Telehealth: Payer: Self-pay | Admitting: *Deleted

## 2012-08-14 MED ORDER — METFORMIN HCL 500 MG PO TABS: 1000.0000 mg | ORAL_TABLET | Freq: Two times a day (BID) | ORAL | Status: DC

## 2012-08-14 NOTE — Telephone Encounter (Signed)
Refill done.  

## 2012-09-04 ENCOUNTER — Other Ambulatory Visit: Payer: Self-pay | Admitting: Internal Medicine

## 2012-10-09 ENCOUNTER — Other Ambulatory Visit: Payer: Self-pay | Admitting: Internal Medicine

## 2012-10-09 NOTE — Telephone Encounter (Signed)
Refill done.  

## 2012-10-12 ENCOUNTER — Other Ambulatory Visit: Payer: Self-pay | Admitting: Internal Medicine

## 2012-10-12 NOTE — Telephone Encounter (Signed)
Refill done.  

## 2012-11-30 ENCOUNTER — Telehealth: Payer: Self-pay | Admitting: *Deleted

## 2012-11-30 NOTE — Telephone Encounter (Signed)
Last OV 08-07-12, last filled 09-04-12 #180

## 2012-12-03 MED ORDER — ALLOPURINOL 100 MG PO TABS: ORAL_TABLET | ORAL | Status: DC

## 2012-12-03 NOTE — Telephone Encounter (Signed)
Rx sent 

## 2012-12-03 NOTE — Telephone Encounter (Signed)
Ok x 6 months 

## 2013-02-05 ENCOUNTER — Ambulatory Visit (INDEPENDENT_AMBULATORY_CARE_PROVIDER_SITE_OTHER): Payer: BC Managed Care – PPO | Admitting: Internal Medicine

## 2013-02-05 ENCOUNTER — Encounter: Payer: Self-pay | Admitting: Internal Medicine

## 2013-02-05 ENCOUNTER — Telehealth: Payer: Self-pay | Admitting: Internal Medicine

## 2013-02-05 VITALS — BP 130/88 | HR 69 | Temp 98.0°F | Wt 229.8 lb

## 2013-02-05 DIAGNOSIS — E119 Type 2 diabetes mellitus without complications: Secondary | ICD-10-CM

## 2013-02-05 DIAGNOSIS — E785 Hyperlipidemia, unspecified: Secondary | ICD-10-CM

## 2013-02-05 DIAGNOSIS — I10 Essential (primary) hypertension: Secondary | ICD-10-CM

## 2013-02-05 DIAGNOSIS — K222 Esophageal obstruction: Secondary | ICD-10-CM

## 2013-02-05 DIAGNOSIS — M109 Gout, unspecified: Secondary | ICD-10-CM

## 2013-02-05 LAB — BASIC METABOLIC PANEL
BUN: 18 mg/dL (ref 6–23)
Calcium: 9.4 mg/dL (ref 8.4–10.5)
Creatinine, Ser: 0.9 mg/dL (ref 0.4–1.5)
GFR: 98.43 mL/min (ref >60.00)
Glucose, Bld: 115 mg/dL — ABNORMAL HIGH (ref 70–99)
Potassium: 4.9 mEq/L (ref 3.5–5.1)

## 2013-02-05 LAB — HEMOGLOBIN A1C: Hgb A1c MFr Bld: 6.6 % — ABNORMAL HIGH (ref 4.6–6.5)

## 2013-02-05 NOTE — Assessment & Plan Note (Signed)
Check LFTs 

## 2013-02-05 NOTE — Assessment & Plan Note (Addendum)
Feet exam normal today Last eye exam 6 months ago per patient Diet, exercise, feet discussed Labs He does have some paresthesias in the left arm, recommend observation for now, Radiculopathy? Cubital tunnel syndrome?

## 2013-02-05 NOTE — Telephone Encounter (Signed)
Patient would like a colonoscopy and would like to go to Dr. Loreta Ave. He would prefer a Wednesday.

## 2013-02-05 NOTE — Assessment & Plan Note (Signed)
No change for now, check BMP

## 2013-02-05 NOTE — Patient Instructions (Addendum)
Next visit in 4 months  Check the  blood pressure 2 or 3 times a week, be sure it is between 110/60 and 140/85. If it is consistently higher or lower, let me know Check your sugar 4-5 times a week Call if the tingling in the arm gets worse    Diabetes and Foot Care Diabetes may cause you to have a poor blood supply (circulation) to your legs and feet. Because of this, the skin may be thinner, break easier, and heal more slowly. You also may have nerve damage in your legs and feet causing decreased feeling. You may not notice minor injuries to your feet that could lead to serious problems or infections. Taking care of your feet is one of the most important things you can do for yourself.  HOME CARE INSTRUCTIONS  Do not go barefoot. Bare feet are easily injured.  Check your feet daily for blisters, cuts, and redness.  Wash your feet with warm water (not hot) and mild soap. Pat your feet and between your toes until completely dry.  Apply a moisturizing lotion that does not contain alcohol or petroleum jelly to the dry skin on your feet and to dry brittle toenails. Do not put it between your toes.  Trim your toenails straight across. Do not dig under them or around the cuticle.  Do not cut corns or calluses, or try to remove them with medicine.  Wear clean cotton socks or stockings every day. Make sure they are not too tight. Do not wear knee high stockings since they may decrease blood flow to your legs.  Wear leather shoes that fit properly and have enough cushioning. To break in new shoes, wear them just a few hours a day to avoid injuring your feet.  Wear shoes at all times, even in the house.  Do not cross your legs. This may decrease the blood flow to your feet.  If you find a minor scrape, cut, or break in the skin on your feet, keep it and the skin around it clean and dry. These areas may be cleansed with mild soap and water. Do not use peroxide, alcohol, iodine or  Merthiolate.  When you remove an adhesive bandage, be sure not to harm the skin around it.  If you have a wound, look at it several times a day to make sure it is healing.  Do not use heating pads or hot water bottles. Burns can occur. If you have lost feeling in your feet or legs, you may not know it is happening until it is too late.  Report any cuts, sores or bruises to your caregiver. Do not wait! SEEK MEDICAL CARE IF:   You have an injury that is not healing or you notice redness, numbness, burning, or tingling.  Your feet always feel cold.  You have pain or cramps in your legs and feet. SEEK IMMEDIATE MEDICAL CARE IF:   There is increasing redness, swelling, or increasing pain in the wound.  There is a red line that goes up your leg.  Pus is coming from a wound.  You develop an unexplained oral temperature above 102 F (38.9 C), or as your caregiver suggests.  You notice a bad smell coming from an ulcer or wound. MAKE SURE YOU:   Understand these instructions.  Will watch your condition.  Will get help right away if you are not doing well or get worse. Document Released: 06/10/2000 Document Revised: 09/05/2011 Document Reviewed: 12/17/2008 Exit[employer] Patient Information  2014 [employer].  

## 2013-02-05 NOTE — Progress Notes (Signed)
  Subjective:    Patient ID: Barry Bush, male    DOB: [birth year], 52 y.o.   MRN: [id number]  HPI Routine office visit Diabetes, not checking CBGs lately, good medication compliance. High cholesterol, good compliance with Lipitor Hypertension, good compliance of medication, not ambulatory BPs, BP today 130/88 Gout, on allopurinol, asymptomatic. Reports a whole left arm numbness from time to time, usually immediately after he wakes up, lasts a few minutes, not associated with chest pain, neck pain. Some tingling in the left 2, 3, ,4 and 5 digits.  Past Medical History  Diagnosis Date  . GERD (gastroesophageal reflux disease)   . Gout   . Hypertension   . Psoriasis   . Esophageal stricture 2003    s/p dilation  . Diabetes mellitus 08-2010   Past Surgical History  Procedure Laterality Date  . Facial trauma     History   Social History  . Marital Status: Married    Spouse Name: N/A    Number of Children: 1  . Years of Education: N/A   Occupational History  . 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     Social History Main Topics  . Smoking status: Never Smoker   . Smokeless tobacco: Never Used  . Alcohol Use: Yes     Comment: 3 days a week  . Drug Use: No  . Sexually Active: Not on file   Other Topics Concern  . Not on file   Social History Narrative  . No narrative on file    Review of Systems Diet--about the same as before Exercise-active at work, no routine exercise No cough, shortness or breath or lower extremity edema No nausea, vomiting, diarrhea.    Objective:   Physical Exam BP 130/88  Pulse 69  Temp(Src) 98 F (36.7 C) (Oral)  Wt 229 lb 12.8 oz (104.237 kg)  BMI 30.33 kg/m2  SpO2 98% General -- alert, well-developed, NAD   Neck --FROM, no ytender at the Cspine Lungs -- normal respiratory effort, no intercostal retractions, no accessory muscle use, and normal breath sounds.   Heart-- normal rate, regular rhythm, no murmur, and no gallop.    DIABETIC FEET EXAM: No lower extremity edema Normal pedal pulses bilaterally Skin and nails are normal without calluses Pinprick examination of the feet normal. Neurologic-- alert & oriented X3 ; DTRs symmetric, motor normal in all extremities. Psych-- Cognition and judgment appear intact. Alert and cooperative with normal attention span and concentration.  not anxious appearing and not depressed appearing.         Assessment & Plan:

## 2013-02-05 NOTE — Assessment & Plan Note (Signed)
Asymptomatic, check uric acid

## 2013-02-06 NOTE — Telephone Encounter (Signed)
Referral entered  

## 2013-02-06 NOTE — Addendum Note (Signed)
Addended by: Wende Mott on: 02/06/2013 05:07 PM   Modules accepted: Orders

## 2013-02-06 NOTE — Telephone Encounter (Signed)
Ok to order 

## 2013-02-06 NOTE — Telephone Encounter (Signed)
Please arrange referral to Dr Loreta Ave

## 2013-02-07 ENCOUNTER — Encounter: Payer: Self-pay | Admitting: *Deleted

## 2013-02-18 ENCOUNTER — Other Ambulatory Visit: Payer: Self-pay | Admitting: Internal Medicine

## 2013-02-19 NOTE — Telephone Encounter (Signed)
Med filled.  

## 2013-03-25 ENCOUNTER — Other Ambulatory Visit: Payer: Self-pay | Admitting: Internal Medicine

## 2013-03-25 DIAGNOSIS — E119 Type 2 diabetes mellitus without complications: Secondary | ICD-10-CM

## 2013-03-25 NOTE — Telephone Encounter (Signed)
Metformin refill sent to [facility]

## 2013-04-08 ENCOUNTER — Other Ambulatory Visit: Payer: Self-pay | Admitting: Internal Medicine

## 2013-04-08 MED ORDER — METOPROLOL TARTRATE 100 MG PO TABS: ORAL_TABLET | ORAL | Status: DC

## 2013-04-08 NOTE — Telephone Encounter (Signed)
Metoprolol and benazepril refills sent to pharmacy

## 2013-05-01 LAB — HM COLONOSCOPY

## 2013-06-07 ENCOUNTER — Ambulatory Visit (INDEPENDENT_AMBULATORY_CARE_PROVIDER_SITE_OTHER): Payer: BC Managed Care – PPO | Admitting: Internal Medicine

## 2013-06-07 ENCOUNTER — Encounter: Payer: Self-pay | Admitting: Internal Medicine

## 2013-06-07 VITALS — BP 145/87 | HR 90 | Temp 97.9°F | Wt 235.0 lb

## 2013-06-07 DIAGNOSIS — N529 Male erectile dysfunction, unspecified: Secondary | ICD-10-CM

## 2013-06-07 DIAGNOSIS — E785 Hyperlipidemia, unspecified: Secondary | ICD-10-CM

## 2013-06-07 DIAGNOSIS — I10 Essential (primary) hypertension: Secondary | ICD-10-CM

## 2013-06-07 DIAGNOSIS — Z23 Encounter for immunization: Secondary | ICD-10-CM

## 2013-06-07 DIAGNOSIS — E119 Type 2 diabetes mellitus without complications: Secondary | ICD-10-CM

## 2013-06-07 LAB — LIPID PANEL
LDL Cholesterol: 88 mg/dL (ref 0–99)
Total CHOL/HDL Ratio: 3
Triglycerides: 96 mg/dL (ref 0.0–149.0)

## 2013-06-07 MED ORDER — SILDENAFIL CITRATE 100 MG PO TABS: 50.0000 mg | ORAL_TABLET | Freq: Every day | ORAL | Status: DC | PRN

## 2013-06-07 NOTE — Progress Notes (Signed)
Pre visit review using our clinic review tool, if applicable. No additional management support is needed unless otherwise documented below in the visit note. 

## 2013-06-07 NOTE — Progress Notes (Signed)
   Subjective:    Patient ID: Barry Bush, male    DOB: 28-May-1961, 52 y.o.   MRN: [id number]  HPI ROV For the management of hypertension, diabetes and high cholesterol. Labs were reviewed, appropriate labs will be ordered. Medications reviewed, good compliance. Check blood sugars regularly, the last time was 118 after eating. No recent ambulatory BPs. She also has a new problem, ED: Difficulty with erections for a while, wonders if he could try a medication.    Past Medical History  Diagnosis Date  . GERD (gastroesophageal reflux disease)   . Gout   . Hypertension   . Psoriasis   . Esophageal stricture 2003    s/p dilation  . Diabetes mellitus 08-2010   Past Surgical History  Procedure Laterality Date  . Facial trauma      History   Social History  . Marital Status: Married    Spouse Name: N/A    Number of Children: 1  . Years of Education: N/A   Occupational History  . 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     Social History Main Topics  . Smoking status: Never Smoker   . Smokeless tobacco: Never Used  . Alcohol Use: Yes     Comment: 3 days a week  . Drug Use: No  . Sexual Activity: Not on file   Other Topics Concern  . Not on file   Social History Narrative  . No narrative on file     Review of Systems Denies chest pain or short of breath No nausea, vomiting, diarrhea. Occasionally feels burning at the plantar area bilaterally mostly at night    Objective:   Physical Exam  BP 145/87  Pulse 90  Temp(Src) 97.9 F (36.6 C)  Wt 235 lb (106.595 kg)  SpO2 98% General -- alert, well-developed, NAD.  Lungs -- normal respiratory effort, no intercostal retractions, no accessory muscle use, and normal breath sounds.  Heart-- normal rate, regular rhythm, no murmur.  DIABETIC FEET EXAM: No lower extremity edema Normal cap refill, warm feet Skin normal, nails normal, no calluses Pinprick examination of the feet normal. Neurologic--  alert &  oriented X3. Speech normal, gait normal, strength normal in all extremities.  DTRs symmetric  EOMI, PERLA   Psych-- Cognition and judgment appear intact. Cooperative with normal attention span and concentration. No anxious appearing , no depressed appearing.      Assessment & Plan:

## 2013-06-07 NOTE — Patient Instructions (Signed)
Get your blood work before you leave  Next visit for a physical exam  , no fasting, in 4 months  Please make an appointment     Diabetes and Foot Care Diabetes may cause you to have problems because of poor blood supply (circulation) to your feet and legs. This may cause the skin on your feet to become thinner, break easier, and heal more slowly. Your skin may become dry, and the skin may peel and crack. You may also have nerve damage in your legs and feet causing decreased feeling in them. You may not notice minor injuries to your feet that could lead to infections or more serious problems. Taking care of your feet is one of the most important things you can do for yourself.  HOME CARE INSTRUCTIONS  Wear shoes at all times, even in the house. Do not go barefoot. Bare feet are easily injured.  Check your feet daily for blisters, cuts, and redness. If you cannot see the bottom of your feet, use a mirror or ask someone for help.  Wash your feet with warm water (do not use hot water) and mild soap. Then pat your feet and the areas between your toes until they are completely dry. Do not soak your feet as this can dry your skin.  Apply a moisturizing lotion or petroleum jelly (that does not contain alcohol and is unscented) to the skin on your feet and to dry, brittle toenails. Do not apply lotion between your toes.  Trim your toenails straight across. Do not dig under them or around the cuticle. File the edges of your nails with an emery board or nail file.  Do not cut corns or calluses or try to remove them with medicine.  Wear clean socks or stockings every day. Make sure they are not too tight. Do not wear knee-high stockings since they may decrease blood flow to your legs.  Wear shoes that fit properly and have enough cushioning. To break in new shoes, wear them for just a few hours a day. This prevents you from injuring your feet. Always look in your shoes before you put them on to be sure  there are no objects inside.  Do not cross your legs. This may decrease the blood flow to your feet.  If you find a minor scrape, cut, or break in the skin on your feet, keep it and the skin around it clean and dry. These areas may be cleansed with mild soap and water. Do not cleanse the area with peroxide, alcohol, or iodine.  When you remove an adhesive bandage, be sure not to damage the skin around it.  If you have a wound, look at it several times a day to make sure it is healing.  Do not use heating pads or hot water bottles. They may burn your skin. If you have lost feeling in your feet or legs, you may not know it is happening until it is too late.  Make sure your health care provider performs a complete foot exam at least annually or more often if you have foot problems. Report any cuts, sores, or bruises to your health care provider immediately. SEEK MEDICAL CARE IF:   You have an injury that is not healing.  You have cuts or breaks in the skin.  You have an ingrown nail.  You notice redness on your legs or feet.  You feel burning or tingling in your legs or feet.  You have pain or  cramps in your legs and feet.  Your legs or feet are numb.  Your feet always feel cold. SEEK IMMEDIATE MEDICAL CARE IF:   There is increasing redness, swelling, or pain in or around a wound.  There is a red line that goes up your leg.  Pus is coming from a wound.  You develop a fever or as directed by your health care provider.  You notice a bad smell coming from an ulcer or wound. Document Released: 06/10/2000 Document Revised: 02/13/2013 Document Reviewed: 11/20/2012 [facility] Patient Information 2014 [place].

## 2013-06-08 ENCOUNTER — Encounter: Payer: Self-pay | Admitting: Internal Medicine

## 2013-06-08 DIAGNOSIS — N529 Male erectile dysfunction, unspecified: Secondary | ICD-10-CM | POA: Insufficient documentation

## 2013-06-08 NOTE — Assessment & Plan Note (Signed)
ED, new issue, prescription and instructions for Viagra provided, side effects and how to use it discussed.

## 2013-06-08 NOTE — Assessment & Plan Note (Signed)
Hypertension-- recommend ambulatory BPs once or twice a week,   BP goal less than 140 / 85

## 2013-06-08 NOTE — Assessment & Plan Note (Signed)
Diabetes--no change, labs.

## 2013-06-08 NOTE — Assessment & Plan Note (Signed)
high cholesterol--recent LFTs normal, check FLP

## 2013-06-10 ENCOUNTER — Encounter: Payer: Self-pay | Admitting: *Deleted

## 2013-08-06 ENCOUNTER — Other Ambulatory Visit: Payer: Self-pay | Admitting: *Deleted

## 2013-08-06 MED ORDER — SILDENAFIL CITRATE 20 MG PO TABS: 20.0000 mg | ORAL_TABLET | Freq: Three times a day (TID) | ORAL | Status: DC

## 2013-08-07 ENCOUNTER — Ambulatory Visit (INDEPENDENT_AMBULATORY_CARE_PROVIDER_SITE_OTHER): Payer: BC Managed Care – PPO | Admitting: Family Medicine

## 2013-08-07 ENCOUNTER — Encounter: Payer: Self-pay | Admitting: Family Medicine

## 2013-08-07 VITALS — BP 134/86 | HR 70 | Temp 98.6°F | Wt 234.0 lb

## 2013-08-07 DIAGNOSIS — J40 Bronchitis, not specified as acute or chronic: Secondary | ICD-10-CM

## 2013-08-07 MED ORDER — ALBUTEROL SULFATE (2.5 MG/3ML) 0.083% IN NEBU
2.5000 mg | INHALATION_SOLUTION | Freq: Once | RESPIRATORY_TRACT | Status: AC
  Administered 2013-08-07: 2.5 mg via RESPIRATORY_TRACT

## 2013-08-07 MED ORDER — METHYLPREDNISOLONE ACETATE 80 MG/ML IJ SUSP
80.0000 mg | Freq: Once | INTRAMUSCULAR | Status: AC
  Administered 2013-08-07: 80 mg via INTRAMUSCULAR

## 2013-08-07 NOTE — Progress Notes (Signed)
  Subjective:     Barry Bush is a 53 y.o. male here for evaluation of a cough. Onset of symptoms was 5 days ago. Symptoms have been gradually worsening since that time. The cough is dry and productive and is aggravated by infection and reclining position. Associated symptoms include: shortness of breath, sputum production and wheezing. Patient does not have a history of asthma. Patient does not have a history of environmental allergens. Patient has not traveled recently. Patient does not have a history of smoking. Patient has not had a previous chest x-ray. Patient has not had a PPD done.  The following portions of the patient's history were reviewed and updated as appropriate: allergies, current medications, past family history, past medical history, past social history, past surgical history and problem list.  Review of Systems Pertinent items are noted in HPI.    Objective:    Oxygen saturation 97% on room air BP 134/86  Pulse 70  Temp(Src) 98.6 F (37 C) (Oral)  Wt 234 lb (106.142 kg)  SpO2 97% General appearance: alert, cooperative, appears stated age and no distress Nose: Nares normal. Septum midline. Mucosa normal. No drainage or sinus tenderness. Throat: lips, mucosa, and tongue normal; teeth and gums normal Neck: no adenopathy, supple, symmetrical, trachea midline and thyroid not enlarged, symmetric, no tenderness/mass/nodules Lungs: wheezes bilaterally Heart: S1, S2 normal    Assessment:    Acute Bronchitis    Plan:    Antibiotics per medication orders. Antitussives per medication orders. Avoid exposure to tobacco smoke and fumes. Call if shortness of breath worsens, blood in sputum, change in character of cough, development of fever or chills, inability to maintain nutrition and hydration. Avoid exposure to tobacco smoke and fumes. steroid taper

## 2013-08-07 NOTE — Progress Notes (Signed)
Pre visit review using our clinic review tool, if applicable. No additional management support is needed unless otherwise documented below in the visit note. 

## 2013-10-07 ENCOUNTER — Telehealth: Payer: Self-pay

## 2013-10-07 NOTE — Telephone Encounter (Signed)
Medication List and allergies:  Reviewed and updated  90 day supply/mail order: na Local prescriptions: [facility] [place] [facility] Immunizations due:   A/P:   No changes to FH, PSH or Personal Hx Flu vaccine--05/2013 Tdap--11/2005 CCS--04/2013--Dr Mann--q 10 per patient PSA--07/2012--1.41  To Discuss with Provider: Not at this time

## 2013-10-08 ENCOUNTER — Ambulatory Visit (INDEPENDENT_AMBULATORY_CARE_PROVIDER_SITE_OTHER): Payer: BC Managed Care – PPO | Admitting: Internal Medicine

## 2013-10-08 ENCOUNTER — Encounter: Payer: Self-pay | Admitting: Internal Medicine

## 2013-10-08 VITALS — BP 137/84 | HR 67 | Temp 98.0°F | Ht 73.0 in | Wt 229.0 lb

## 2013-10-08 DIAGNOSIS — Z0000 Encounter for general adult medical examination without abnormal findings: Secondary | ICD-10-CM

## 2013-10-08 DIAGNOSIS — K222 Esophageal obstruction: Secondary | ICD-10-CM

## 2013-10-08 DIAGNOSIS — E119 Type 2 diabetes mellitus without complications: Secondary | ICD-10-CM

## 2013-10-08 DIAGNOSIS — Z23 Encounter for immunization: Secondary | ICD-10-CM

## 2013-10-08 LAB — CBC WITH DIFFERENTIAL/PLATELET
Basophils Absolute: 0 10*3/uL (ref 0.0–0.1)
Basophils Relative: 0.6 % (ref 0.0–3.0)
Eosinophils Absolute: 0.2 10*3/uL (ref 0.0–0.7)
Eosinophils Relative: 3.5 % (ref 0.0–5.0)
HEMATOCRIT: 38.5 % — AB (ref 39.0–52.0)
HEMOGLOBIN: 13.3 g/dL (ref 13.0–17.0)
Lymphocytes Relative: 26 % (ref 12.0–46.0)
Lymphs Abs: 1.3 10*3/uL (ref 0.7–4.0)
MCHC: 34.4 g/dL (ref 30.0–36.0)
MCV: 92.5 fl (ref 78.0–100.0)
MONOS PCT: 8.3 % (ref 3.0–12.0)
Monocytes Absolute: 0.4 10*3/uL (ref 0.1–1.0)
NEUTROS ABS: 3.2 10*3/uL (ref 1.4–7.7)
Neutrophils Relative %: 61.6 % (ref 43.0–77.0)
PLATELETS: 253 10*3/uL (ref 150.0–400.0)
RBC: 4.16 Mil/uL — AB (ref 4.22–5.81)
RDW: 13.2 % (ref 11.5–14.6)
WBC: 5.2 10*3/uL (ref 4.5–10.5)

## 2013-10-08 LAB — BASIC METABOLIC PANEL
BUN: 12 mg/dL (ref 6–23)
CALCIUM: 9 mg/dL (ref 8.4–10.5)
CO2: 27 mEq/L (ref 19–32)
CREATININE: 0.7 mg/dL (ref 0.4–1.5)
Chloride: 102 mEq/L (ref 96–112)
GFR: 122.13 mL/min (ref >60.00)
Glucose, Bld: 113 mg/dL — ABNORMAL HIGH (ref 70–99)
Potassium: 4.3 mEq/L (ref 3.5–5.1)
Sodium: 137 mEq/L (ref 135–145)

## 2013-10-08 LAB — ALT: ALT: 32 U/L (ref 0–53)

## 2013-10-08 LAB — LIPID PANEL
Cholesterol: 155 mg/dL (ref 0–200)
HDL: 48.1 mg/dL (ref >39.00)
LDL Cholesterol: 90 mg/dL (ref 0–99)
Total CHOL/HDL Ratio: 3
Triglycerides: 86 mg/dL (ref 0.0–149.0)
VLDL: 17.2 mg/dL (ref 0.0–40.0)

## 2013-10-08 LAB — URIC ACID: Uric Acid, Serum: 7.5 mg/dL (ref 4.0–7.8)

## 2013-10-08 LAB — HEMOGLOBIN A1C: Hgb A1c MFr Bld: 7 % — ABNORMAL HIGH (ref 4.6–6.5)

## 2013-10-08 LAB — PSA: PSA: 1.07 ng/mL (ref 0.10–4.00)

## 2013-10-08 LAB — AST: AST: 22 U/L (ref 0–37)

## 2013-10-08 NOTE — Assessment & Plan Note (Addendum)
Td 07 PNM today cscope 04-2013, pt told 10 years Cont ASA , exercise and a healthy diet. Labs  + stress, some anxiety -depression---> counseled , rec to keep me posted if more help is needed Chronic med problem: seems well controlled, labs including uric acid (no recent gout episodes )

## 2013-10-08 NOTE — Assessment & Plan Note (Signed)
asx

## 2013-10-08 NOTE — Assessment & Plan Note (Signed)
CBGs ~ 119 Plans to see the eye doctor (due for a visit)

## 2013-10-08 NOTE — Progress Notes (Signed)
Pre visit review using our clinic review tool, if applicable. No additional management support is needed unless otherwise documented below in the visit note. 

## 2013-10-08 NOTE — Patient Instructions (Signed)
Get your blood work before you leave     Next visit is for routine check up regards your blood sugar    in 6 months. No need to come back fasting Please make an appointment    

## 2013-10-08 NOTE — Progress Notes (Signed)
Subjective:    Patient ID: Barry Bush, male    DOB: 11/02/1960, 53 y.o.   MRN: [id number]  DOS:  10/08/2013 Type of  visit: CPX   ROS Diet-- improved Exercise-- active at work, yard work DM---  (-) LE paresthesias , (-) visual disturbances  No  CP, SOB No palpitations, no lower extremity edema Denies  nausea, vomiting diarrhea Denies  blood in the stools (-) cough, sputum production (-) wheezing, chest congestion No dysuria, gross hematuria, difficulty urinating  + + stress, some anxiety, mild depression Denies suicidal ideas  No headaches  Denies dizziness     Past Medical History  Diagnosis Date  . GERD (gastroesophageal reflux disease)   . Gout   . Hypertension   . Psoriasis   . Esophageal stricture 2003    s/p dilation  . Diabetes mellitus 08-2010    Past Surgical History  Procedure Laterality Date  . Facial trauma      History   Social History  . Marital Status: Married    Spouse Name: N/A    Number of Children: 1  . Years of Education: N/A   Occupational History  . 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     Social History Main Topics  . Smoking status: Never Smoker   . Smokeless tobacco: Never Used  . Alcohol Use: Yes     Comment: 3 days a week  . Drug Use: No  . Sexual Activity: Not on file   Other Topics Concern  . Not on file   Social History Narrative  . No narrative on file     Family History  Problem Relation Age of Onset  . Diabetes Father   . Diabetes Mother   . Lung cancer      GM  . Melanoma      GF  . Colon cancer Neg Hx   . Prostate cancer Neg Hx   . Heart attack Father     2 stents 2012, onset at age 52       Medication List       This list is accurate as of: 10/08/13  4:59 PM.  Always use your most recent med list.               allopurinol 100 MG tablet  Commonly known as:  ZYLOPRIM  daily. TAKE 1 TABLET BY MOUTH ONCE  A DAY.     aspirin 81 MG tablet  Take 81 mg by mouth daily.     atorvastatin 10 MG tablet  Commonly known as:  LIPITOR  TAKE 1 TABLET BY MOUTH ONCE DAILY.     benazepril 20 MG tablet  Commonly known as:  LOTENSIN  TAKE 1 TABLET BY MOUTH DAILY.     metFORMIN 500 MG tablet  Commonly known as:  GLUCOPHAGE  TAKE 2 TABLETS BY MOUTH 2 TIMES DAILY WITH A MEAL.     metoprolol 100 MG tablet  Commonly known as:  LOPRESSOR  TAKE 1 TABLET BY MOUTH ONCE DAILY.     omeprazole 20 MG capsule  Commonly known as:  PRILOSEC  Take 20 mg by mouth daily.     ranitidine 150 MG capsule  Commonly known as:  ZANTAC  Take 150 mg by mouth as needed. OTC.     sildenafil 100 MG tablet  Commonly known as:  VIAGRA  Take 100 mg by mouth daily as needed for erectile dysfunction.  Objective:   Physical Exam BP 137/84  Pulse 67  Temp(Src) 98 F (36.7 C)  Ht 6\' 1"  (1.854 m)  Wt 229 lb (103.874 kg)  BMI 30.22 kg/m2  SpO2 100% General -- alert, well-developed, NAD.  Neck --no thyromegaly  HEENT-- Not pale.  Lungs -- normal respiratory effort, no intercostal retractions, no accessory muscle use, and normal breath sounds.  Heart-- normal rate, regular rhythm, no murmur.  Abdomen-- Not distended, good bowel sounds,soft, non-tender. Rectal-- No external abnormalities noted. Normal sphincter tone. No rectal masses or tenderness. Brown stool  Prostate--Prostate gland firm and smooth, no enlargement, nodularity, tenderness, mass, asymmetry or induration. Extremities-- no pretibial edema bilaterally  Neurologic--  alert & oriented X3. Speech normal, gait normal, strength normal in all extremities.  Psych-- Cognition and judgment appear intact. Cooperative with normal attention span and concentration. No anxious or depressed appearing.      Assessment & Plan:

## 2013-10-10 MED ORDER — METFORMIN HCL 850 MG PO TABS: 850.0000 mg | ORAL_TABLET | Freq: Two times a day (BID) | ORAL | Status: DC

## 2013-10-10 NOTE — Addendum Note (Signed)
Addended by: Eustace QuailEABOLD, Niara Bunker J on: 10/10/2013 01:34 PM   Modules accepted: Orders

## 2013-10-22 ENCOUNTER — Telehealth: Payer: Self-pay

## 2013-10-22 NOTE — Telephone Encounter (Signed)
Relevant patient education mailed to patient.  

## 2013-11-06 ENCOUNTER — Other Ambulatory Visit: Payer: Self-pay | Admitting: Internal Medicine

## 2013-11-07 NOTE — Telephone Encounter (Signed)
Rx's sent to the pharmacy by e-script.//AB/CMA 

## 2013-11-11 ENCOUNTER — Encounter: Payer: Self-pay | Admitting: Internal Medicine

## 2013-11-11 ENCOUNTER — Ambulatory Visit (INDEPENDENT_AMBULATORY_CARE_PROVIDER_SITE_OTHER): Payer: BC Managed Care – PPO | Admitting: Internal Medicine

## 2013-11-11 VITALS — BP 139/75 | HR 72 | Temp 99.1°F | Wt 230.0 lb

## 2013-11-11 DIAGNOSIS — L02519 Cutaneous abscess of unspecified hand: Secondary | ICD-10-CM

## 2013-11-11 DIAGNOSIS — Z23 Encounter for immunization: Secondary | ICD-10-CM

## 2013-11-11 DIAGNOSIS — L03119 Cellulitis of unspecified part of limb: Secondary | ICD-10-CM

## 2013-11-11 MED ORDER — DOXYCYCLINE HYCLATE 100 MG PO TABS: 100.0000 mg | ORAL_TABLET | Freq: Two times a day (BID) | ORAL | Status: DC

## 2013-11-11 NOTE — Patient Instructions (Signed)
Take the antibiotic as prescribed Keep the hand elevated, put an ice pack 3 times a day Tylenol or Motrin as needed for pain Come back for a recheck Friday morning ER if the redness started spreading, you have fever, chills or increased swelling.

## 2013-11-11 NOTE — Progress Notes (Signed)
Subjective:    Patient ID: Barry BrasilRonald Bush, male    DOB: Mar 15, 1961, 53 y.o.   MRN: [id number]  DOS:  11/11/2013 Type of  visit: Acute visit Scratch his right hand a week ago cleaning his mother's gutters. Shortly afterward developed pain and minimal swelling in the right hand, 2 days ago the swelling definitely got worse along with more pain. Taking ibuprofen.   ROS No fever or chills at home, temperature here 99.1. No lip or tongue swelling. On same meds including ACEi  for a while  Past Medical History  Diagnosis Date  . GERD (gastroesophageal reflux disease)   . Gout   . Hypertension   . Psoriasis   . Esophageal stricture 2003    s/p dilation  . Diabetes mellitus 08-2010    Past Surgical History  Procedure Laterality Date  . Facial trauma      History   Social History  . Marital Status: Married    Spouse Name: N/A    Number of Children: 1  . Years of Education: N/A   Occupational History  . 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     Social History Main Topics  . Smoking status: Never Smoker   . Smokeless tobacco: Never Used  . Alcohol Use: Yes     Comment: 3 days a week  . Drug Use: No  . Sexual Activity: Not on file   Other Topics Concern  . Not on file   Social History Narrative  . No narrative on file        Medication List       This list is accurate as of: 11/11/13 11:59 PM.  Always use your most recent med list.               allopurinol 100 MG tablet  Commonly known as:  ZYLOPRIM  daily. TAKE 1 TABLET BY MOUTH ONCE  A DAY.     aspirin 81 MG tablet  Take 81 mg by mouth daily.     atorvastatin 10 MG tablet  Commonly known as:  LIPITOR  TAKE 1 TABLET BY MOUTH ONCE DAILY.     benazepril 20 MG tablet  Commonly known as:  LOTENSIN  TAKE 1 TABLET BY MOUTH DAILY.     doxycycline 100 MG tablet  Commonly known as:  VIBRA-TABS  Take 1 tablet (100 mg total) by mouth 2 (two) times daily.     metFORMIN 850 MG tablet  Commonly  known as:  GLUCOPHAGE  Take 1 tablet (850 mg total) by mouth 2 (two) times daily with a meal.     metoprolol 100 MG tablet  Commonly known as:  LOPRESSOR  TAKE 1 TABLET BY MOUTH ONCE DAILY.     omeprazole 20 MG capsule  Commonly known as:  PRILOSEC  Take 20 mg by mouth daily.     ranitidine 150 MG capsule  Commonly known as:  ZANTAC  Take 150 mg by mouth as needed. OTC.     sildenafil 100 MG tablet  Commonly known as:  VIAGRA  Take 100 mg by mouth daily as needed for erectile dysfunction.           Objective:   Physical Exam  Musculoskeletal:       Arms:  BP 139/75  Pulse 72  Temp(Src) 99.1 F (37.3 C) (Oral)  Wt 230 lb (104.327 kg)  SpO2 96% General -- alert, well-developed, NAD.  Psych-- Cognition and judgment appear intact. Cooperative with  normal attention span and concentration. No anxious or depressed appearing.      Assessment & Plan:   Cellulitis, Findings consistent with cellulitis likely due to a minor scratch at the dorsum of the hand. Wrist range of motion is decreased I believe mostly due to to swelling. Plan: Tetanus shot Start doxycycline as soon as possible Recheck in 4 days Knows to call me sooner if the redness spreads or swelling increases

## 2013-11-11 NOTE — Progress Notes (Signed)
Pre-visit discussion using our clinic review tool. No additional management support is needed unless otherwise documented below in the visit note.  

## 2013-11-15 ENCOUNTER — Ambulatory Visit (INDEPENDENT_AMBULATORY_CARE_PROVIDER_SITE_OTHER): Payer: BC Managed Care – PPO | Admitting: Internal Medicine

## 2013-11-15 ENCOUNTER — Other Ambulatory Visit: Payer: Self-pay | Admitting: Internal Medicine

## 2013-11-15 ENCOUNTER — Ambulatory Visit (HOSPITAL_BASED_OUTPATIENT_CLINIC_OR_DEPARTMENT_OTHER)
Admission: RE | Admit: 2013-11-15 | Discharge: 2013-11-15 | Disposition: A | Discharge status: Home / Self Care | Payer: BC Managed Care – PPO | Source: Ambulatory Visit | Attending: Internal Medicine | Admitting: Internal Medicine

## 2013-11-15 ENCOUNTER — Encounter: Payer: Self-pay | Admitting: Internal Medicine

## 2013-11-15 VITALS — BP 166/99 | HR 66 | Temp 98.6°F | Wt 232.0 lb

## 2013-11-15 DIAGNOSIS — L03119 Cellulitis of unspecified part of limb: Secondary | ICD-10-CM

## 2013-11-15 DIAGNOSIS — M259 Joint disorder, unspecified: Secondary | ICD-10-CM | POA: Insufficient documentation

## 2013-11-15 DIAGNOSIS — L02519 Cutaneous abscess of unspecified hand: Secondary | ICD-10-CM

## 2013-11-15 NOTE — Progress Notes (Signed)
Pre-visit discussion using our clinic review tool. No additional management support is needed unless otherwise documented below in the visit note.  

## 2013-11-15 NOTE — Patient Instructions (Signed)
Get the XR at [facility], corner of [address] (10 minutes form here); they are open 24/7 [address]  [place], [place] [address] [phone]  Finish the antibiotic Call if not back to normal within 2 weeks  Next visit by 03-2014

## 2013-11-15 NOTE — Progress Notes (Signed)
Subjective:    Patient ID: Barry Bush, male    DOB: [birth year], 53 y.o.   MRN: [id number]  DOS:  11/15/2013 Type of  visit: followup from previous visit Hand cellulitis improving, less swelling, redness pain. Good compliance with antibiotics, denies nausea, vomiting, diarrhea. No fever or chills  ROS See HPI  Past Medical History  Diagnosis Date  . GERD (gastroesophageal reflux disease)   . Gout   . Hypertension   . Psoriasis   . Esophageal stricture 2003    s/p dilation  . Diabetes mellitus 08-2010    Past Surgical History  Procedure Laterality Date  . Facial trauma      History   Social History  . Marital Status: Married    Spouse Name: N/A    Number of Children: 1  . Years of Education: N/A   Occupational History  . 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     Social History Main Topics  . Smoking status: Never Smoker   . Smokeless tobacco: Never Used  . Alcohol Use: Yes     Comment: 3 days a week  . Drug Use: No  . Sexual Activity: Not on file   Other Topics Concern  . Not on file   Social History Narrative  . No narrative on file        Medication List       This list is accurate as of: 11/15/13 11:59 PM.  Always use your most recent med list.               allopurinol 100 MG tablet  Commonly known as:  ZYLOPRIM  daily. TAKE 1 TABLET BY MOUTH ONCE  A DAY.     aspirin 81 MG tablet  Take 81 mg by mouth daily.     atorvastatin 10 MG tablet  Commonly known as:  LIPITOR  TAKE 1 TABLET BY MOUTH ONCE DAILY.     benazepril 20 MG tablet  Commonly known as:  LOTENSIN  TAKE 1 TABLET BY MOUTH DAILY.     doxycycline 100 MG tablet  Commonly known as:  VIBRA-TABS  Take 1 tablet (100 mg total) by mouth 2 (two) times daily.     metFORMIN 850 MG tablet  Commonly known as:  GLUCOPHAGE  Take 1 tablet (850 mg total) by mouth 2 (two) times daily with a meal.     metoprolol 100 MG tablet  Commonly known as:  LOPRESSOR  TAKE 1 TABLET BY  MOUTH ONCE DAILY.     omeprazole 20 MG capsule  Commonly known as:  PRILOSEC  Take 20 mg by mouth daily.     ranitidine 150 MG capsule  Commonly known as:  ZANTAC  Take 150 mg by mouth as needed. OTC.     sildenafil 100 MG tablet  Commonly known as:  VIAGRA  Take 100 mg by mouth daily as needed for erectile dysfunction.           Objective:   Physical Exam BP 166/99  Pulse 66  Temp(Src) 98.6 F (37 C) (Oral)  Wt 232 lb (105.235 kg)  SpO2 96% General -- alert, well-developed, NAD.  Extremities--  Right hand and wrist: Redness resolved, swelling improving. wrist  is still slightly tender to palpation and range of motion is not completely back to normal. Neurologic--  alert & oriented X3. Speech normal, gait normal, strength normal in all extremities.  Psych-- Cognition and judgment appear intact. Cooperative with normal attention span  and concentration. No anxious or depressed appearing.        Assessment & Plan:   Hand cellulitis Improving after 4 days of doxycycline, recommend to finish the full 10 days. I'm somehow concerned about the wrist,  recommend x-ray to rule out a deep-seated infections as such as osteomyelitis. if not completely back to normal within 2 weeks the patient knows to call me.

## 2013-11-18 ENCOUNTER — Emergency Department (HOSPITAL_BASED_OUTPATIENT_CLINIC_OR_DEPARTMENT_OTHER)
Admission: EM | Admit: 2013-11-18 | Discharge: 2013-11-18 | Disposition: A | Discharge status: Home / Self Care | Payer: BC Managed Care – PPO | Attending: Emergency Medicine | Admitting: Emergency Medicine

## 2013-11-18 ENCOUNTER — Emergency Department (HOSPITAL_BASED_OUTPATIENT_CLINIC_OR_DEPARTMENT_OTHER): Payer: BC Managed Care – PPO

## 2013-11-18 ENCOUNTER — Encounter (HOSPITAL_BASED_OUTPATIENT_CLINIC_OR_DEPARTMENT_OTHER): Payer: Self-pay | Admitting: Emergency Medicine

## 2013-11-18 DIAGNOSIS — Z792 Long term (current) use of antibiotics: Secondary | ICD-10-CM | POA: Insufficient documentation

## 2013-11-18 DIAGNOSIS — Z79899 Other long term (current) drug therapy: Secondary | ICD-10-CM | POA: Insufficient documentation

## 2013-11-18 DIAGNOSIS — R51 Headache: Secondary | ICD-10-CM | POA: Insufficient documentation

## 2013-11-18 DIAGNOSIS — E119 Type 2 diabetes mellitus without complications: Secondary | ICD-10-CM | POA: Insufficient documentation

## 2013-11-18 DIAGNOSIS — K219 Gastro-esophageal reflux disease without esophagitis: Secondary | ICD-10-CM | POA: Insufficient documentation

## 2013-11-18 DIAGNOSIS — Z7982 Long term (current) use of aspirin: Secondary | ICD-10-CM | POA: Insufficient documentation

## 2013-11-18 DIAGNOSIS — Z872 Personal history of diseases of the skin and subcutaneous tissue: Secondary | ICD-10-CM | POA: Insufficient documentation

## 2013-11-18 DIAGNOSIS — M109 Gout, unspecified: Secondary | ICD-10-CM | POA: Insufficient documentation

## 2013-11-18 DIAGNOSIS — R519 Headache, unspecified: Secondary | ICD-10-CM

## 2013-11-18 DIAGNOSIS — I10 Essential (primary) hypertension: Secondary | ICD-10-CM | POA: Insufficient documentation

## 2013-11-18 LAB — COMPREHENSIVE METABOLIC PANEL
ALK PHOS: 54 U/L (ref 39–117)
ALT: 53 U/L (ref 0–53)
AST: 30 U/L (ref 0–37)
Albumin: 4 g/dL (ref 3.5–5.2)
BILIRUBIN TOTAL: 0.2 mg/dL — AB (ref 0.3–1.2)
BUN: 9 mg/dL (ref 6–23)
CHLORIDE: 97 meq/L (ref 96–112)
CO2: 25 mEq/L (ref 19–32)
Calcium: 9 mg/dL (ref 8.4–10.5)
Creatinine, Ser: 0.7 mg/dL (ref 0.50–1.35)
GFR calc Af Amer: >90 mL/min (ref >90)
Glucose, Bld: 117 mg/dL — ABNORMAL HIGH (ref 70–99)
POTASSIUM: 3.7 meq/L (ref 3.7–5.3)
Sodium: 138 mEq/L (ref 137–147)
Total Protein: 7.1 g/dL (ref 6.0–8.3)

## 2013-11-18 LAB — CBC WITH DIFFERENTIAL/PLATELET
BASOS PCT: 0 % (ref 0–1)
Band Neutrophils: 9 % (ref 0–10)
Basophils Absolute: 0 10*3/uL (ref 0.0–0.1)
EOS PCT: 12 % — AB (ref 0–5)
Eosinophils Absolute: 0.6 10*3/uL (ref 0.0–0.7)
HEMATOCRIT: 38.7 % — AB (ref 39.0–52.0)
HEMOGLOBIN: 13.3 g/dL (ref 13.0–17.0)
LYMPHS PCT: 14 % (ref 12–46)
Lymphs Abs: 0.7 10*3/uL (ref 0.7–4.0)
MCH: 31.9 pg (ref 26.0–34.0)
MCHC: 34.4 g/dL (ref 30.0–36.0)
MCV: 92.8 fL (ref 78.0–100.0)
MONO ABS: 0.3 10*3/uL (ref 0.1–1.0)
Monocytes Relative: 5 % (ref 3–12)
Neutro Abs: 3.6 10*3/uL (ref 1.7–7.7)
Neutrophils Relative %: 60 % (ref 43–77)
Platelets: 244 10*3/uL (ref 150–400)
RBC: 4.17 MIL/uL — ABNORMAL LOW (ref 4.22–5.81)
RDW: 12.1 % (ref 11.5–15.5)
WBC: 5.2 10*3/uL (ref 4.0–10.5)

## 2013-11-18 MED ORDER — DIPHENHYDRAMINE HCL 50 MG/ML IJ SOLN
25.0000 mg | Freq: Once | INTRAMUSCULAR | Status: AC
  Administered 2013-11-18: 25 mg via INTRAVENOUS
  Filled 2013-11-18: qty 1

## 2013-11-18 MED ORDER — METOCLOPRAMIDE HCL 5 MG/ML IJ SOLN
10.0000 mg | Freq: Once | INTRAMUSCULAR | Status: AC
  Administered 2013-11-18: 10 mg via INTRAVENOUS
  Filled 2013-11-18: qty 2

## 2013-11-18 NOTE — ED Notes (Signed)
Headache since yesterday. Chills. Unknown fever.

## 2013-11-18 NOTE — ED Provider Notes (Signed)
CSN: [id number]     Arrival date & time 11/18/13  1405 History   First MD Initiated Contact with Patient 11/18/13 1535     Chief Complaint  Patient presents with  . Headache   Pt reports he was treated for a hand infection 2 weeks ago.  Pt reports he developed a headache yesterday.  Pt reports he normally does not have headaches,  Improved with aleve,   Pt reports continues today.   Pt also had numbness to his right lower lip for more than 2 days.  Pt feels like he has a fever  (Consider location/radiation/quality/duration/timing/severity/associated sxs/prior Treatment) Patient is a 53 y.o. male presenting with headaches. The history is provided by the patient. No language interpreter was used.  Headache Radiates to:  Does not radiate Onset quality:  Gradual Duration:  2 days Timing:  Constant Progression:  Worsening Chronicity:  New Similar to prior headaches: no   Context: not activity   Relieved by:  Nothing Worsened by:  Nothing tried Risk factors: no anger     Past Medical History  Diagnosis Date  . GERD (gastroesophageal reflux disease)   . Gout   . Hypertension   . Psoriasis   . Esophageal stricture 2003    s/p dilation  . Diabetes mellitus 08-2010   Past Surgical History  Procedure Laterality Date  . Facial trauma     Family History  Problem Relation Age of Onset  . Diabetes Father   . Diabetes Mother   . Lung cancer      GM  . Melanoma      GF  . Colon cancer Neg Hx   . Prostate cancer Neg Hx   . Heart attack Father     2 stents 2012, onset at age 52   History  Substance Use Topics  . Smoking status: Never Smoker   . Smokeless tobacco: Never Used  . Alcohol Use: Yes     Comment: 3 days a week    Review of Systems  Neurological: Positive for headaches.  All other systems reviewed and are negative.     Allergies  Review of patient's allergies indicates no known allergies.  Home Medications   Prior to Admission medications   Medication Sig  Start Date End Date Taking? Authorizing Provider  allopurinol (ZYLOPRIM) 100 MG tablet daily. TAKE 1 TABLET BY MOUTH ONCE  A DAY. 12/03/12   Wanda PlumpJose E Paz, MD  aspirin 81 MG tablet Take 81 mg by mouth daily.    Historical Provider, MD  atorvastatin (LIPITOR) 10 MG tablet TAKE 1 TABLET BY MOUTH ONCE DAILY. 02/18/13   Wanda PlumpJose E Paz, MD  benazepril (LOTENSIN) 20 MG tablet TAKE 1 TABLET BY MOUTH DAILY.    Wanda PlumpJose E Paz, MD  doxycycline (VIBRA-TABS) 100 MG tablet Take 1 tablet (100 mg total) by mouth 2 (two) times daily. 11/11/13   Wanda PlumpJose E Paz, MD  metFORMIN (GLUCOPHAGE) 850 MG tablet Take 1 tablet (850 mg total) by mouth 2 (two) times daily with a meal. 10/10/13   Wanda PlumpJose E Paz, MD  metoprolol (LOPRESSOR) 100 MG tablet TAKE 1 TABLET BY MOUTH ONCE DAILY.    Wanda PlumpJose E Paz, MD  omeprazole (PRILOSEC) 20 MG capsule Take 20 mg by mouth daily.      Historical Provider, MD  ranitidine (ZANTAC) 150 MG capsule Take 150 mg by mouth as needed. OTC.     Historical Provider, MD  sildenafil (VIAGRA) 100 MG tablet Take 100 mg by mouth daily as  needed for erectile dysfunction.    Historical Provider, MD   BP 140/89  Pulse 84  Temp(Src) 97.7 F (36.5 C) (Oral)  Resp 20  Ht 6\' 1"  (1.854 m)  Wt 232 lb (105.235 kg)  BMI 30.62 kg/m2  SpO2 100% Physical Exam  Nursing note and vitals reviewed. Constitutional: He is oriented to person, place, and time. He appears well-developed and well-nourished.  HENT:  Head: Normocephalic.  Right Ear: External ear normal.  Left Ear: External ear normal.  Nose: Nose normal.  Mouth/Throat: Oropharynx is clear and moist.  Eyes: EOM are normal. Pupils are equal, round, and reactive to light.  Neck: Normal range of motion.  Cardiovascular: Normal rate and normal heart sounds.   Pulmonary/Chest: Effort normal and breath sounds normal.  Abdominal: He exhibits no distension.  Musculoskeletal: Normal range of motion.  Neurological: He is alert and oriented to person, place, and time. He has normal  reflexes. He displays normal reflexes. No cranial nerve deficit. He exhibits normal muscle tone. Coordination normal.  Psychiatric: He has a normal mood and affect.    ED Course  Procedures (including critical care time) Labs Review Labs Reviewed - No data to display  Imaging Review No results found.   EKG Interpretation None      MDM   Final diagnoses:  None   Pt's care turned over to Dr. Criss Alvine.    Lonia Skinner [place], PA-C 11/18/13 1609  Lonia Skinner [place], [place] 11/18/13 [phone]

## 2013-11-18 NOTE — ED Provider Notes (Signed)
Medical screening examination/treatment/procedure(s) were conducted as a shared visit with non-physician practitioner(s) and myself.  I personally evaluated the patient during the encounter.   EKG Interpretation None       Patient with headache each morning since yesterday. Most severe in the AM after he wakes up, progressively improves throughout day. Normal exam, including no meningismus or focal neuro abnormalities. This would be atypical for [facility], I have low suspicion for this. Talked about this with patient, he prefers to follow up with PCP and take tylenol/ibuprofen as needed for pain (aleve has helped at home). Has subjective lower lip tingling, but doubt stroke given such a small area affected and normal CT after about 24-48 hours of symptoms. He is well appearing here, will discharge with strict return precautions.  Audree Camel, MD 11/18/13 [phone]

## 2013-11-18 NOTE — Discharge Instructions (Signed)
You are having a headache. No specific cause was found today for your headache. It may have been a migraine or other cause of headache. Stress, anxiety, fatigue, and depression are common triggers for headaches. Your headache today does not appear to be life-threatening or require hospitalization, but often the exact cause of headaches is not determined in the emergency department. Therefore, follow-up with your doctor is very important to find out what may have caused your headache, and whether or not you need any further diagnostic testing or treatment. Sometimes headaches can appear benign (not harmful), but then more serious symptoms can develop which should prompt an immediate re-evaluation by your doctor or the emergency department. SEEK MEDICAL ATTENTION IF: You develop possible problems with medications prescribed.  The medications don't resolve your headache, if it recurs , or if you have multiple episodes of vomiting or can't take fluids. You have a change from the usual headache. RETURN IMMEDIATELY IF you develop a sudden, severe headache or confusion, become poorly responsive or faint, develop a fever above 100.78F or problem breathing, have a change in speech, vision, swallowing, or understanding, or develop new weakness, numbness, tingling, incoordination, or have a seizure.    Headaches, Frequently Asked Questions MIGRAINE HEADACHES Q: What is migraine? What causes it? How can I treat it? A: Generally, migraine headaches begin as a dull ache. Then they develop into a constant, throbbing, and pulsating pain. You may experience pain at the temples. You may experience pain at the front or back of one or both sides of the head. The pain is usually accompanied by a combination of:  Nausea.  Vomiting.  Sensitivity to light and noise. Some people (about 15%) experience an aura (see below) before an attack. The cause of migraine is believed to be chemical reactions in the brain. Treatment for  migraine may include over-the-counter or prescription medications. It may also include self-help techniques. These include relaxation training and biofeedback.  Q: What is an aura? A: About 15% of people with migraine get an "aura". This is a sign of neurological symptoms that occur before a migraine headache. You may see wavy or jagged lines, dots, or flashing lights. You might experience tunnel vision or blind spots in one or both eyes. The aura can include visual or auditory hallucinations (something imagined). It may include disruptions in smell (such as strange odors), taste or touch. Other symptoms include:  Numbness.  A "pins and needles" sensation.  Difficulty in recalling or speaking the correct word. These neurological events may last as long as 60 minutes. These symptoms will fade as the headache begins. Q: What is a trigger? A: Certain physical or environmental factors can lead to or "trigger" a migraine. These include:  Foods.  Hormonal changes.  Weather.  Stress. It is important to remember that triggers are different for everyone. To help prevent migraine attacks, you need to figure out which triggers affect you. Keep a headache diary. This is a good way to track triggers. The diary will help you talk to your healthcare professional about your condition. Q: Does weather affect migraines? A: Bright sunshine, hot, humid conditions, and drastic changes in barometric pressure may lead to, or "trigger," a migraine attack in some people. But studies have shown that weather does not act as a trigger for everyone with migraines. Q: What is the link between migraine and hormones? A: Hormones start and regulate many of your body's functions. Hormones keep your body in balance within a constantly changing environment.  The levels of hormones in your body are unbalanced at times. Examples are during menstruation, pregnancy, or menopause. That can lead to a migraine attack. In fact, about  three quarters of all women with migraine report that their attacks are related to the menstrual cycle.  Q: Is there an increased risk of stroke for migraine sufferers? A: The likelihood of a migraine attack causing a stroke is very remote. That is not to say that migraine sufferers cannot have a stroke associated with their migraines. In persons under age 36, the most common associated factor for stroke is migraine headache. But over the course of a person's normal life span, the occurrence of migraine headache may actually be associated with a reduced risk of dying from cerebrovascular disease due to stroke.  Q: What are acute medications for migraine? A: Acute medications are used to treat the pain of the headache after it has started. Examples over-the-counter medications, NSAIDs, ergots, and triptans.  Q: What are the triptans? A: Triptans are the newest class of abortive medications. They are specifically targeted to treat migraine. Triptans are vasoconstrictors. They moderate some chemical reactions in the brain. The triptans work on receptors in your brain. Triptans help to restore the balance of a neurotransmitter called serotonin. Fluctuations in levels of serotonin are thought to be a main cause of migraine.  Q: Are over-the-counter medications for migraine effective? A: Over-the-counter, or "OTC," medications may be effective in relieving mild to moderate pain and associated symptoms of migraine. But you should see your caregiver before beginning any treatment regimen for migraine.  Q: What are preventive medications for migraine? A: Preventive medications for migraine are sometimes referred to as "prophylactic" treatments. They are used to reduce the frequency, severity, and length of migraine attacks. Examples of preventive medications include antiepileptic medications, antidepressants, beta-blockers, calcium channel blockers, and NSAIDs (nonsteroidal anti-inflammatory drugs). Q: Why are  anticonvulsants used to treat migraine? A: During the past few years, there has been an increased interest in antiepileptic drugs for the prevention of migraine. They are sometimes referred to as "anticonvulsants". Both epilepsy and migraine may be caused by similar reactions in the brain.  Q: Why are antidepressants used to treat migraine? A: Antidepressants are typically used to treat people with depression. They may reduce migraine frequency by regulating chemical levels, such as serotonin, in the brain.  Q: What alternative therapies are used to treat migraine? A: The term "alternative therapies" is often used to describe treatments considered outside the scope of conventional Western medicine. Examples of alternative therapy include acupuncture, acupressure, and yoga. Another common alternative treatment is herbal therapy. Some herbs are believed to relieve headache pain. Always discuss alternative therapies with your caregiver before proceeding. Some herbal products contain arsenic and other toxins. TENSION HEADACHES Q: What is a tension-type headache? What causes it? How can I treat it? A: Tension-type headaches occur randomly. They are often the result of temporary stress, anxiety, fatigue, or anger. Symptoms include soreness in your temples, a tightening band-like sensation around your head (a "vice-like" ache). Symptoms can also include a pulling feeling, pressure sensations, and contracting head and neck muscles. The headache begins in your forehead, temples, or the back of your head and neck. Treatment for tension-type headache may include over-the-counter or prescription medications. Treatment may also include self-help techniques such as relaxation training and biofeedback. CLUSTER HEADACHES Q: What is a cluster headache? What causes it? How can I treat it? A: Cluster headache gets its name because the attacks come  in groups. The pain arrives with little, if any, warning. It is usually on  one side of the head. A tearing or bloodshot eye and a runny nose on the same side of the headache may also accompany the pain. Cluster headaches are believed to be caused by chemical reactions in the brain. They have been described as the most severe and intense of any headache type. Treatment for cluster headache includes prescription medication and oxygen. SINUS HEADACHES Q: What is a sinus headache? What causes it? How can I treat it? A: When a cavity in the bones of the face and skull (a sinus) becomes inflamed, the inflammation will cause localized pain. This condition is usually the result of an allergic reaction, a tumor, or an infection. If your headache is caused by a sinus blockage, such as an infection, you will probably have a fever. An x-ray will confirm a sinus blockage. Your caregiver's treatment might include antibiotics for the infection, as well as antihistamines or decongestants.  REBOUND HEADACHES Q: What is a rebound headache? What causes it? How can I treat it? A: A pattern of taking acute headache medications too often can lead to a condition known as "rebound headache." A pattern of taking too much headache medication includes taking it more than 2 days per week or in excessive amounts. That means more than the label or a caregiver advises. With rebound headaches, your medications not only stop relieving pain, they actually begin to cause headaches. Doctors treat rebound headache by tapering the medication that is being overused. Sometimes your caregiver will gradually substitute a different type of treatment or medication. Stopping may be a challenge. Regularly overusing a medication increases the potential for serious side effects. Consult a caregiver if you regularly use headache medications more than 2 days per week or more than the label advises. ADDITIONAL QUESTIONS AND ANSWERS Q: What is biofeedback? A: Biofeedback is a self-help treatment. Biofeedback uses special equipment  to monitor your body's involuntary physical responses. Biofeedback monitors:  Breathing.  Pulse.  Heart rate.  Temperature.  Muscle tension.  Brain activity. Biofeedback helps you refine and perfect your relaxation exercises. You learn to control the physical responses that are related to stress. Once the technique has been mastered, you do not need the equipment any more. Q: Are headaches hereditary? A: Four out of five (80%) of people that suffer report a family history of migraine. Scientists are not sure if this is genetic or a family predisposition. Despite the uncertainty, a child has a 50% chance of having migraine if one parent suffers. The child has a 75% chance if both parents suffer.  Q: Can children get headaches? A: By the time they reach high school, most young people have experienced some type of headache. Many safe and effective approaches or medications can prevent a headache from occurring or stop it after it has begun.  Q: What type of doctor should I see to diagnose and treat my headache? A: Start with your primary caregiver. Discuss his or her experience and approach to headaches. Discuss methods of classification, diagnosis, and treatment. Your caregiver may decide to recommend you to a headache specialist, depending upon your symptoms or other physical conditions. Having diabetes, allergies, etc., may require a more comprehensive and inclusive approach to your headache. The National Head[employer] Foundation will provide, upon request, a list of [facility] physician members in your state. Document Released: 09/03/2003 Document Revised: 09/05/2011 Document Reviewed: 02/11/2008 [facility] Patient Information 2014 [place], [place].

## 2013-11-19 ENCOUNTER — Encounter: Payer: Self-pay | Admitting: *Deleted

## 2013-12-03 ENCOUNTER — Other Ambulatory Visit: Payer: Self-pay | Admitting: Internal Medicine

## 2014-03-13 ENCOUNTER — Other Ambulatory Visit: Payer: Self-pay | Admitting: Internal Medicine

## 2014-04-15 ENCOUNTER — Encounter: Payer: Self-pay | Admitting: Internal Medicine

## 2014-04-15 ENCOUNTER — Ambulatory Visit (INDEPENDENT_AMBULATORY_CARE_PROVIDER_SITE_OTHER): Payer: BC Managed Care – PPO | Admitting: Internal Medicine

## 2014-04-15 VITALS — BP 130/74 | HR 58 | Temp 97.9°F | Wt 231.2 lb

## 2014-04-15 DIAGNOSIS — S99929A Unspecified injury of unspecified foot, initial encounter: Secondary | ICD-10-CM

## 2014-04-15 DIAGNOSIS — Z23 Encounter for immunization: Secondary | ICD-10-CM

## 2014-04-15 DIAGNOSIS — E119 Type 2 diabetes mellitus without complications: Secondary | ICD-10-CM

## 2014-04-15 DIAGNOSIS — R51 Headache: Secondary | ICD-10-CM

## 2014-04-15 DIAGNOSIS — I10 Essential (primary) hypertension: Secondary | ICD-10-CM

## 2014-04-15 DIAGNOSIS — R519 Headache, unspecified: Secondary | ICD-10-CM

## 2014-04-15 LAB — HEMOGLOBIN A1C: Hgb A1c MFr Bld: 6.9 % — ABNORMAL HIGH (ref 4.6–6.5)

## 2014-04-15 NOTE — Assessment & Plan Note (Addendum)
Chart review, recent BMP normal, no recent ambulatory BPs, BP today is satisfactory. Plan: Continue benazepril, beta blockers , rec amb BPs

## 2014-04-15 NOTE — Patient Instructions (Signed)
Get your blood work before you leave   Check the  blood pressure  weekly  Be sure your blood pressure is between  145/85 and 110/65.  if it is consistently higher or lower, let me know  Diabetics: Check your blood sugar weekly (or more often)  GOALS: Fasting before a meal 70- 130 2 hours after a meal less than 180 At bedtime 90-150 Call if consistently not at goal     Please come back to the office by 09-2014  for a physical exam. Come back fasting

## 2014-04-15 NOTE — Assessment & Plan Note (Addendum)
Based on the last A1c, metformin dose was increased. No recent ambulatory CBGs. He discussed  the importance of checking blood sugars from time to time, in his case I recommend at least weekly. Also recommend yearly eye checkup which he does Feet exam negative today Plan: A1c Continue metformin 850 mg twice a day

## 2014-04-15 NOTE — Progress Notes (Signed)
Pre visit review using our clinic review tool, if applicable. No additional management support is needed unless otherwise documented below in the visit note. 

## 2014-04-15 NOTE — Progress Notes (Signed)
Subjective:    Patient ID: Barry Bush, male    DOB: 1960-07-13, 53 y.o.   MRN: [id number]  DOS:  04/15/2014 Type of visit - description : ROV Interval history: Diabetes, good medication compliance, no ambulatory CBGs High cholesterol, good medication compliance History of gout, no recent symptoms Hypertension, good medication compliance, no ambulatory BPs Headache 3 months ago, went to the ER, note reviewed, CT head negative, no further symptoms. Last week He injured his left foot working in the yard, the dorsum looked red and swollen, there was no bleeding; Much improved now. Available labs reviewed, due for a A1c  ROS Denies chest pain or difficulty breathing No nausea, vomiting, diarrhea  Past Medical History  Diagnosis Date  . GERD (gastroesophageal reflux disease)   . Gout   . Hypertension   . Psoriasis   . Esophageal stricture 2003    s/p dilation  . Diabetes mellitus 08-2010    Past Surgical History  Procedure Laterality Date  . Facial trauma      History   Social History  . Marital Status: Married    Spouse Name: N/A    Number of Children: 1  . Years of Education: N/A   Occupational History  . 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     Social History Main Topics  . Smoking status: Never Smoker   . Smokeless tobacco: Never Used  . Alcohol Use: Yes     Comment: 3 days a week  . Drug Use: No  . Sexual Activity: Not on file   Other Topics Concern  . Not on file   Social History Narrative  . No narrative on file        Medication List       This list is accurate as of: 04/15/14  6:14 PM.  Always use your most recent med list.               allopurinol 100 MG tablet  Commonly known as:  ZYLOPRIM  TAKE 1 TABLET BY MOUTH  once a DAY.     aspirin 81 MG tablet  Take 81 mg by mouth daily.     atorvastatin 10 MG tablet  Commonly known as:  LIPITOR  TAKE 1 TABLET BY MOUTH ONCE DAILY.     benazepril 20 MG tablet  Commonly known  as:  LOTENSIN  TAKE 1 TABLET BY MOUTH DAILY.     metFORMIN 850 MG tablet  Commonly known as:  GLUCOPHAGE  Take 1 tablet (850 mg total) by mouth 2 (two) times daily with a meal.     metoprolol 100 MG tablet  Commonly known as:  LOPRESSOR  TAKE 1 TABLET BY MOUTH ONCE DAILY.     omeprazole 20 MG capsule  Commonly known as:  PRILOSEC  Take 20 mg by mouth daily.     ranitidine 150 MG capsule  Commonly known as:  ZANTAC  Take 150 mg by mouth as needed. OTC.     sildenafil 100 MG tablet  Commonly known as:  VIAGRA  Take 100 mg by mouth daily as needed for erectile dysfunction.           Objective:   Physical Exam BP 130/74  Pulse 58  Temp(Src) 97.9 F (36.6 C) (Oral)  Wt 231 lb 4 oz (104.894 kg)  SpO2 98% General -- alert, well-developed, NAD.   Lungs -- normal respiratory effort, no intercostal retractions, no accessory muscle use, and normal breath sounds.  Heart-- normal rate, regular rhythm, no murmur.  DIABETIC FEET EXAM: No lower extremity edema Normal pedal pulses bilaterally Skin normal, nails normal, no calluses Pinprick examination of the feet normal. Dorsum of the left it is slightly tender but no deformity, redness or swelling. Neurologic--  alert & oriented X3. Speech normal, gait appropriate for age, strength symmetric and appropriate for age.  Psych-- Cognition and judgment appear intact. Cooperative with normal attention span and concentration. No anxious or depressed appearing.        Assessment & Plan:   Headache 11-2013, seen at the ER, CT negative, symptoms resolve  Foot injury,  Resulting, I don't believe he has a fracture, recommend observation

## 2014-04-17 MED ORDER — METFORMIN HCL 850 MG PO TABS: ORAL_TABLET | ORAL | Status: DC

## 2014-04-17 NOTE — Addendum Note (Signed)
Addended by: Dorette GrateFAULKNER, Carlethia Mesquita C on: 04/17/2014 01:18 PM   Modules accepted: Orders

## 2014-05-05 ENCOUNTER — Other Ambulatory Visit: Payer: Self-pay | Admitting: Internal Medicine

## 2014-05-14 ENCOUNTER — Other Ambulatory Visit: Payer: Self-pay

## 2014-05-14 ENCOUNTER — Telehealth: Payer: Self-pay | Admitting: Internal Medicine

## 2014-05-14 MED ORDER — METFORMIN HCL 850 MG PO TABS: ORAL_TABLET | ORAL | Status: DC

## 2014-05-14 NOTE — Telephone Encounter (Signed)
Pt is needing rx metFORMIN (GLUCOPHAGE) 850, send to [facility].

## 2014-05-14 NOTE — Telephone Encounter (Signed)
[facility].

## 2014-05-29 ENCOUNTER — Other Ambulatory Visit: Payer: Self-pay | Admitting: Internal Medicine

## 2014-09-26 ENCOUNTER — Telehealth: Payer: Self-pay | Admitting: Internal Medicine

## 2014-09-26 NOTE — Telephone Encounter (Signed)
Pre visit letter sent  °

## 2014-10-01 ENCOUNTER — Other Ambulatory Visit: Payer: Self-pay

## 2014-10-15 ENCOUNTER — Telehealth: Payer: Self-pay

## 2014-10-15 NOTE — Telephone Encounter (Signed)
Pre visit questionnaire completed 

## 2014-10-16 ENCOUNTER — Encounter: Payer: Self-pay | Admitting: Medical

## 2014-10-16 ENCOUNTER — Ambulatory Visit (INDEPENDENT_AMBULATORY_CARE_PROVIDER_SITE_OTHER): Payer: BLUE CROSS/BLUE SHIELD | Admitting: Medical

## 2014-10-16 ENCOUNTER — Other Ambulatory Visit: Payer: Self-pay | Admitting: Internal Medicine

## 2014-10-16 VITALS — BP 145/87 | HR 91 | Temp 98.3°F | Ht 73.0 in | Wt 227.6 lb

## 2014-10-16 DIAGNOSIS — H6502 Acute serous otitis media, left ear: Secondary | ICD-10-CM | POA: Diagnosis not present

## 2014-10-16 DIAGNOSIS — J301 Allergic rhinitis due to pollen: Secondary | ICD-10-CM | POA: Diagnosis not present

## 2014-10-16 DIAGNOSIS — H6690 Otitis media, unspecified, unspecified ear: Secondary | ICD-10-CM | POA: Insufficient documentation

## 2014-10-16 DIAGNOSIS — J309 Allergic rhinitis, unspecified: Secondary | ICD-10-CM | POA: Insufficient documentation

## 2014-10-16 MED ORDER — AMOXICILLIN-POT CLAVULANATE 875-125 MG PO TABS: 1.0000 | ORAL_TABLET | Freq: Two times a day (BID) | ORAL | Status: DC

## 2014-10-16 MED ORDER — MOMETASONE FUROATE 50 MCG/ACT NA SUSP: NASAL | Status: DC

## 2014-10-16 MED ORDER — LORATADINE 10 MG PO TABS: 10.0000 mg | ORAL_TABLET | Freq: Every day | ORAL | Status: DC

## 2014-10-16 NOTE — Assessment & Plan Note (Signed)
rx claritin. Nasonex nasal spray. If spray not covered then flonase otc.

## 2014-10-16 NOTE — Progress Notes (Signed)
Pre visit review using our clinic review tool, if applicable. No additional management support is needed unless otherwise documented below in the visit note. 

## 2014-10-16 NOTE — Progress Notes (Signed)
Subjective:    Patient ID: Barry BrasilRonald Bush, male    DOB: [birth year], 54 y.o.   MRN: [id number]  HPI  Pt in with some left ear pain last night. A lot of throbbing. If hic ups or yawns ear hurts worse. Pt had some nasal congestion, runny nose and sneezing for 1 wk. Some itching eyes. Pt does have some spring allergies on occasion but not every year.  Pt bs 107 on Saturday. Faint mild light headed on and off. But no other associated neuro signs or symptoms.  Review of Systems  Constitutional: Negative for fever, chills, diaphoresis, activity change and fatigue.  HENT: Positive for congestion, ear pain, postnasal drip and rhinorrhea. Negative for sinus pressure and sore throat.   Eyes: Positive for itching.  Respiratory: Negative for cough, chest tightness and shortness of breath.   Cardiovascular: Negative for chest pain, palpitations and leg swelling.  Gastrointestinal: Negative for nausea, vomiting and abdominal pain.  Musculoskeletal: Negative for neck pain and neck stiffness.  Neurological: Positive for light-headedness. Negative for dizziness, tremors, weakness and headaches.       Tnansient mile with lt om.  Psychiatric/Behavioral: Negative for behavioral problems, confusion and agitation. The patient is not nervous/anxious.    Past Medical History  Diagnosis Date  . GERD (gastroesophageal reflux disease)   . Gout   . Hypertension   . Psoriasis   . Esophageal stricture 2003    s/p dilation  . Diabetes mellitus 08-2010    History   Social History  . Marital Status: Married    Spouse Name: N/A  . Number of Children: 1  . Years of Education: N/A   Occupational History  . 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     Social History Main Topics  . Smoking status: Never Smoker   . Smokeless tobacco: Never Used  . Alcohol Use: Yes     Comment: 3 days a week  . Drug Use: No  . Sexual Activity: Not on file   Other Topics Concern  . Not on file   Social History  Narrative    Past Surgical History  Procedure Laterality Date  . Facial trauma      Family History  Problem Relation Age of Onset  . Diabetes Father   . Diabetes Mother   . Lung cancer      GM  . Melanoma      GF  . Colon cancer Neg Hx   . Prostate cancer Neg Hx   . Heart attack Father     2 stents 2012, onset at age 369    No Known Allergies  Current Outpatient Prescriptions on File Prior to Visit  Medication Sig Dispense Refill  . allopurinol (ZYLOPRIM) 100 MG tablet TAKE 1 TABLET BY MOUTH TWICE A DAY. 180 tablet 1  . aspirin 81 MG tablet Take 81 mg by mouth daily.    . benazepril (LOTENSIN) 20 MG tablet TAKE 1 TABLET BY MOUTH DAILY. 90 tablet 1  . metFORMIN (GLUCOPHAGE) 850 MG tablet Take 2 tablets with breakfast and 1 tablet with dinner. 90 tablet 6  . metoprolol (LOPRESSOR) 100 MG tablet TAKE 1 TABLET BY MOUTH ONCE DAILY. 90 tablet 1  . omeprazole (PRILOSEC) 20 MG capsule Take 20 mg by mouth daily.      . ranitidine (ZANTAC) 150 MG capsule Take 150 mg by mouth as needed. OTC.     . sildenafil (VIAGRA) 100 MG tablet Take 100 mg by mouth daily  as needed for erectile dysfunction.    [name] atorvastatin (LIPITOR) 10 MG tablet TAKE 1 TABLET BY MOUTH ONCE DAILY. 30 tablet 4   No current facility-administered medications on file prior to visit.    BP 145/87 mmHg  Pulse 91  Temp(Src) 98.3 F (36.8 C) (Oral)  Ht  (1.854 m)  Wt 227 lb 9.6 oz (103.239 kg)  BMI 30.03 kg/m2  SpO2 97%      Objective:   Physical Exam  General  Mental Status - Alert. General Appearance - Well groomed. Not in acute distress.  Skin Rashes- No Rashes.  HEENT Head- Normal. Ear Auditory Canal - Left- Normal. Right - Normal.Tympanic Membrane- Left- Very bright red. membrane intact. Right- Normal. Eye Sclera/Conjunctiva- Left- Normal. Right- Normal. Nose & Sinuses Nasal Mucosa- Left-  Boggy and Congested. Right-  Boggy and  Congested. No Bilateral maxillary or  frontal sinus  pressure. Mouth & Throat Lips: Upper Lip- Normal: no dryness, cracking, pallor, cyanosis, or vesicular eruption. Lower Lip-Normal: no dryness, cracking, pallor, cyanosis or vesicular eruption. Buccal Mucosa- Bilateral- No Aphthous ulcers. Oropharynx- No Discharge or Erythema. +pnd Tonsils: Characteristics- Bilateral- No Erythema or Congestion. Size/Enlargement- Bilateral- No enlargement. Discharge- bilateral-None.  Neck Neck- Supple. No Masses.   Chest and Lung Exam Auscultation: Breath Sounds:-Clear even and unlabored.  Cardiovascular Auscultation:Rythm- Regular, rate and rhythm. Murmurs & Other Heart Sounds:Ausculatation of the heart reveal- No Murmurs.  Lymphatic Head & Neck General Head & Neck Lymphatics: Bilateral: Description- No Localized lymphadenopathy.       Assessment & Plan:

## 2014-10-16 NOTE — Patient Instructions (Signed)
Allergic rhinitis rx claritin. Nasonex nasal spray. If spray not covered then flonase otc.   Otitis media Rx augmentin. Take 1 tab today when get rx filled and another before you go to sleep. Some chance that IM antibiotic may be beneficial if feeling worse instead of better.      Follow up in 7 days any persisting signs or symptoms. As needed as well. Give us update tomorrow when you are in for wellness exam.

## 2014-10-16 NOTE — Assessment & Plan Note (Signed)
Rx augmentin. Take 1 tab today when get rx filled and another before you go to sleep. Some chance that IM antibiotic may be beneficial if feeling worse instead of better.

## 2014-10-17 ENCOUNTER — Ambulatory Visit (INDEPENDENT_AMBULATORY_CARE_PROVIDER_SITE_OTHER): Payer: BLUE CROSS/BLUE SHIELD | Admitting: Internal Medicine

## 2014-10-17 ENCOUNTER — Encounter: Payer: Self-pay | Admitting: Internal Medicine

## 2014-10-17 VITALS — BP 132/86 | HR 74 | Temp 98.0°F | Ht 73.0 in | Wt 226.0 lb

## 2014-10-17 DIAGNOSIS — Z0000 Encounter for general adult medical examination without abnormal findings: Secondary | ICD-10-CM | POA: Diagnosis not present

## 2014-10-17 DIAGNOSIS — E119 Type 2 diabetes mellitus without complications: Secondary | ICD-10-CM | POA: Diagnosis not present

## 2014-10-17 LAB — LIPID PANEL
CHOL/HDL RATIO: 3
CHOLESTEROL: 132 mg/dL (ref 0–200)
HDL: 49.2 mg/dL (ref >39.00)
LDL Cholesterol: 69 mg/dL (ref 0–99)
NonHDL: 82.8
Triglycerides: 71 mg/dL (ref 0.0–149.0)
VLDL: 14.2 mg/dL (ref 0.0–40.0)

## 2014-10-17 LAB — CBC WITH DIFFERENTIAL/PLATELET
BASOS PCT: 0.5 % (ref 0.0–3.0)
Basophils Absolute: 0 10*3/uL (ref 0.0–0.1)
EOS ABS: 0.2 10*3/uL (ref 0.0–0.7)
EOS PCT: 2.6 % (ref 0.0–5.0)
HCT: 38.2 % — ABNORMAL LOW (ref 39.0–52.0)
Hemoglobin: 13.1 g/dL (ref 13.0–17.0)
LYMPHS PCT: 15.3 % (ref 12.0–46.0)
Lymphs Abs: 1.4 10*3/uL (ref 0.7–4.0)
MCHC: 34.2 g/dL (ref 30.0–36.0)
MCV: 90.8 fl (ref 78.0–100.0)
Monocytes Absolute: 0.9 10*3/uL (ref 0.1–1.0)
Monocytes Relative: 10.1 % (ref 3.0–12.0)
NEUTROS ABS: 6.3 10*3/uL (ref 1.4–7.7)
Neutrophils Relative %: 71.5 % (ref 43.0–77.0)
Platelets: 269 10*3/uL (ref 150.0–400.0)
RBC: 4.21 Mil/uL — ABNORMAL LOW (ref 4.22–5.81)
RDW: 12.5 % (ref 11.5–15.5)
WBC: 8.8 10*3/uL (ref 4.0–10.5)

## 2014-10-17 LAB — HEMOGLOBIN A1C: Hgb A1c MFr Bld: 6.4 % (ref 4.6–6.5)

## 2014-10-17 LAB — COMPREHENSIVE METABOLIC PANEL
ALK PHOS: 53 U/L (ref 39–117)
ALT: 18 U/L (ref 0–53)
AST: 13 U/L (ref 0–37)
Albumin: 4.2 g/dL (ref 3.5–5.2)
BUN: 10 mg/dL (ref 6–23)
CALCIUM: 9.6 mg/dL (ref 8.4–10.5)
CO2: 31 mEq/L (ref 19–32)
Chloride: 100 mEq/L (ref 96–112)
Creatinine, Ser: 0.74 mg/dL (ref 0.40–1.50)
GFR: 117.85 mL/min (ref >60.00)
GLUCOSE: 130 mg/dL — AB (ref 70–99)
POTASSIUM: 4.4 meq/L (ref 3.5–5.1)
Sodium: 138 mEq/L (ref 135–145)
TOTAL PROTEIN: 7.3 g/dL (ref 6.0–8.3)
Total Bilirubin: 0.4 mg/dL (ref 0.2–1.2)

## 2014-10-17 LAB — HIV ANTIBODY (ROUTINE TESTING W REFLEX): HIV 1&2 Ab, 4th Generation: NONREACTIVE

## 2014-10-17 LAB — TSH: TSH: 1.8 u[IU]/mL (ref 0.35–4.50)

## 2014-10-17 MED ORDER — METFORMIN HCL 850 MG PO TABS: ORAL_TABLET | ORAL | Status: DC

## 2014-10-17 MED ORDER — ATORVASTATIN CALCIUM 10 MG PO TABS: 10.0000 mg | ORAL_TABLET | Freq: Every day | ORAL | Status: DC

## 2014-10-17 MED ORDER — ALLOPURINOL 100 MG PO TABS: 100.0000 mg | ORAL_TABLET | Freq: Two times a day (BID) | ORAL | Status: DC

## 2014-10-17 NOTE — Progress Notes (Signed)
Subjective:    Patient ID: Barry Bush, male    DOB: [birth year], 54 y.o.   MRN: [id number]  DOS:  10/17/2014 Type of visit - description : cpx Interval history: Medication list reviewed, labs reviewed. In general doing well except for recent ear infection   Review of Systems Constitutional: No fever, chills. No unexplained wt changes. No unusual sweats HEENT: No dental problems, was seen yesterday with left ear pain, on antibiotics. Pain is slightly decrease; facial swelling, voice changes. No eye discharge, redness or intolerance to light Respiratory: No wheezing or difficulty breathing. Still + cough , no mucus production Cardiovascular: No CP, leg swelling or palpitations GI: no nausea, vomiting,  occasional diarrhea, once or twice a week, watery, nonbloody, no associated abdominal pain or recent weight loss. Patient thinks related to metformin possibly.    Endocrine: No polyphagia, polyuria or polydipsia GU: No dysuria, gross hematuria, difficulty urinating. No urinary urgency or frequency. Musculoskeletal: No joint swellings or unusual aches or pains Skin: No change in the color of the skin, palor ; history of psoriasis, sees dermatology Allergic, immunologic: No environmental allergies or food allergies Neurological: No dizziness or syncope. No headaches. No diplopia, slurred speech, motor deficits, facial numbness Hematological: No enlarged lymph nodes, easy bruising or bleeding Psychiatry: No suicidal ideas, hallucinations, behavior problems or confusion. No unusual/severe anxiety or depression.     Past Medical History  Diagnosis Date  . GERD (gastroesophageal reflux disease)   . Gout   . Hypertension   . Psoriasis     sees derm  . Esophageal stricture 2003    s/p dilation  . Diabetes mellitus 08-2010    Past Surgical History  Procedure Laterality Date  . Facial trauma      History   Social History  . Marital Status: Married    Spouse Name: N/A  .  Number of Children: 1  . Years of Education: N/A   Occupational History  . 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     Social History Main Topics  . Smoking status: Never Smoker   . Smokeless tobacco: Never Used  . Alcohol Use: Yes     Comment: 3 days a week  . Drug Use: No  . Sexual Activity: Not on file   Other Topics Concern  . Not on file   Social History Narrative     Family History  Problem Relation Age of Onset  . Diabetes Father   . Diabetes Mother   . Lung cancer      GM  . Melanoma      GF  . Colon cancer Neg Hx   . Prostate cancer Neg Hx   . Heart attack Father     2 stents 2012, onset at age 53       Medication List       This list is accurate as of: 10/17/14 11:59 PM.  Always use your most recent med list.               allopurinol 100 MG tablet  Commonly known as:  ZYLOPRIM  Take 1 tablet (100 mg total) by mouth 2 (two) times daily.     amoxicillin-clavulanate 875-125 MG per tablet  Commonly known as:  AUGMENTIN  Take 1 tablet by mouth 2 (two) times daily.     aspirin 81 MG tablet  Take 81 mg by mouth daily.     atorvastatin 10 MG tablet  Commonly known as:  LIPITOR  Take 1 tablet (10 mg total) by mouth daily.     benazepril 20 MG tablet  Commonly known as:  LOTENSIN  TAKE 1 TABLET BY MOUTH DAILY.     loratadine 10 MG tablet  Commonly known as:  CLARITIN  Take 1 tablet (10 mg total) by mouth daily.     metFORMIN 850 MG tablet  Commonly known as:  GLUCOPHAGE  Take 2 tablets with breakfast and 1 tablet with dinner.     metoprolol 100 MG tablet  Commonly known as:  LOPRESSOR  Take 1 tablet (100 mg total) by mouth daily.     mometasone 50 MCG/ACT nasal spray  Commonly known as:  NASONEX  2 sprays each nares q day     omeprazole 20 MG capsule  Commonly known as:  PRILOSEC  Take 20 mg by mouth daily.     ranitidine 150 MG capsule  Commonly known as:  ZANTAC  Take 150 mg by mouth as needed. OTC.     sildenafil 100 MG  tablet  Commonly known as:  VIAGRA  Take 100 mg by mouth daily as needed for erectile dysfunction.           Objective:   Physical Exam BP 132/86 mmHg  Pulse 74  Temp(Src) 98 F (36.7 C) (Oral)  Ht  (1.854 m)  Wt 226 lb (102.513 kg)  BMI 29.82 kg/m2  SpO2 98%  General:   Well developed, well nourished . NAD.  Neck:  Full range of motion. Supple. No  thyromegaly , normal carotid pulse HEENT:  Normocephalic . Face symmetric, atraumatic; nose is slightly congested. Right TM normal Left TM red, slightly bulge. Canal is not swollen, no discharge or blood. Lungs:  CTA B Normal respiratory effort, no intercostal retractions, no accessory muscle use. Heart: RRR,  no murmur.  Abdomen:  Not distended, soft, non-tender. No rebound or rigidity. No mass,organomegaly Muscle skeletal: no pretibial edema bilaterally  Skin: Exposed areas without rash. Not pale. Not jaundice Neurologic:  alert & oriented X3.  Speech normal, gait appropriate for age and unassisted Strength symmetric and appropriate for age.  Psych: Cognition and judgment appear intact.  Cooperative with normal attention span and concentration.  Behavior appropriate. No anxious or depressed appearing.       Assessment & Plan:

## 2014-10-17 NOTE — Progress Notes (Signed)
Pre visit review using our clinic review tool, if applicable. No additional management support is needed unless otherwise documented below in the visit note. 

## 2014-10-17 NOTE — Assessment & Plan Note (Addendum)
Td 07 PNM 2015 Prevnar--will wait until the next opportunity, he is sick today  cscope 04-2013, pt told 10 years  Prostate cancer screening: Last 2 PSAs normal. Recheck next year Cont ASA Discussed diet and exercise Labs: CMP, cholesterol, CBC, A1c, TSH, HIV.  Chronic medical issues: Diabetes, check labs Gout, no recent events High cholesterol, good compliance with medications GERD, on PPIs daily and occasional Zantac, symptoms well-controlled Year infection, on antibiotics, improved compared to yesterday, recommend to let us know if the improvement doesn't continue. Psoriasis, dermatology is recommending [facility], will fax all lab results to derm Follow-up 4 months

## 2014-10-17 NOTE — Patient Instructions (Signed)
Get your blood work before you leave   Come back to the office in 4 months   for a routine check up   

## 2014-10-26 DIAGNOSIS — H6990 Unspecified Eustachian tube disorder, unspecified ear: Secondary | ICD-10-CM

## 2014-10-26 DIAGNOSIS — H6502 Acute serous otitis media, left ear: Secondary | ICD-10-CM | POA: Insufficient documentation

## 2014-10-26 HISTORY — DX: Unspecified eustachian tube disorder, unspecified ear: H69.90

## 2014-10-26 HISTORY — DX: Acute serous otitis media, left ear: H65.02

## 2014-10-27 ENCOUNTER — Telehealth: Payer: Self-pay | Admitting: Internal Medicine

## 2014-10-27 DIAGNOSIS — H9319 Tinnitus, unspecified ear: Secondary | ICD-10-CM

## 2014-10-27 NOTE — Telephone Encounter (Signed)
Relation to pt: self Call back number: [phone]   Reason for call:  Spouse requesting a referral to ENT, pt ears are still ringing.

## 2014-10-27 NOTE — Telephone Encounter (Signed)
Okay, dx tinnitus

## 2014-10-27 NOTE — Telephone Encounter (Signed)
Referral placed.

## 2014-10-27 NOTE — Telephone Encounter (Signed)
FYI. Okay to place referral? 

## 2014-10-29 ENCOUNTER — Ambulatory Visit (INDEPENDENT_AMBULATORY_CARE_PROVIDER_SITE_OTHER): Payer: BLUE CROSS/BLUE SHIELD | Admitting: Internal Medicine

## 2014-10-29 ENCOUNTER — Encounter: Payer: Self-pay | Admitting: Internal Medicine

## 2014-10-29 VITALS — BP 128/84 | HR 72 | Temp 97.7°F | Ht 73.0 in | Wt 226.5 lb

## 2014-10-29 DIAGNOSIS — H6500 Acute serous otitis media, unspecified ear: Secondary | ICD-10-CM | POA: Diagnosis not present

## 2014-10-29 MED ORDER — BENAZEPRIL HCL 20 MG PO TABS: 20.0000 mg | ORAL_TABLET | Freq: Every day | ORAL | Status: DC

## 2014-10-29 MED ORDER — AZELASTINE HCL 0.1 % NA SOLN: 2.0000 | Freq: Every evening | NASAL | Status: DC | PRN

## 2014-10-29 NOTE — Progress Notes (Signed)
Pre visit review using our clinic review tool, if applicable. No additional management support is needed unless otherwise documented below in the visit note. 

## 2014-10-29 NOTE — Progress Notes (Signed)
Subjective:    Patient ID: Barry BrasilRonald Tallo, male    DOB: 1960-11-23, 54 y.o.   MRN: [id number]  DOS:  10/29/2014 Type of visit - description : acute Interval history: Was recently seen with left ear infection, s/p Augmentin, in fact he is still taking antibiotics, a leftover from his wife. Overall improving but the hearing from the left ear is decreased and he has some mild ringing in the ear. Taking Claritin every day but not taking Nasonex due to cost  Review of Systems Denies fever chills No postnasal dripping No itchy eyes, nose. No sneezing No cough or sputum production  Past Medical History  Diagnosis Date  . GERD (gastroesophageal reflux disease)   . Gout   . Hypertension   . Psoriasis     sees derm  . Esophageal stricture 2003    s/p dilation  . Diabetes mellitus 08-2010    Past Surgical History  Procedure Laterality Date  . Facial trauma      History   Social History  . Marital Status: Married    Spouse Name: N/A  . Number of Children: 1  . Years of Education: N/A   Occupational History  . 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     Social History Main Topics  . Smoking status: Never Smoker   . Smokeless tobacco: Never Used  . Alcohol Use: Yes     Comment: 3 days a week  . Drug Use: No  . Sexual Activity: Not on file   Other Topics Concern  . Not on file   Social History Narrative        Medication List       This list is accurate as of: 10/29/14  6:01 PM.  Always use your most recent med list.               allopurinol 100 MG tablet  Commonly known as:  ZYLOPRIM  Take 1 tablet (100 mg total) by mouth 2 (two) times daily.     aspirin 81 MG tablet  Take 81 mg by mouth daily.     atorvastatin 10 MG tablet  Commonly known as:  LIPITOR  Take 1 tablet (10 mg total) by mouth daily.     azelastine 0.1 % nasal spray  Commonly known as:  ASTELIN  Place 2 sprays into both nostrils at bedtime as needed for rhinitis. Use in each  nostril as directed     benazepril 20 MG tablet  Commonly known as:  LOTENSIN  Take 1 tablet (20 mg total) by mouth daily.     loratadine 10 MG tablet  Commonly known as:  CLARITIN  Take 1 tablet (10 mg total) by mouth daily.     metFORMIN 850 MG tablet  Commonly known as:  GLUCOPHAGE  Take 2 tablets with breakfast and 1 tablet with dinner.     metoprolol 100 MG tablet  Commonly known as:  LOPRESSOR  Take 1 tablet (100 mg total) by mouth daily.     omeprazole 20 MG capsule  Commonly known as:  PRILOSEC  Take 20 mg by mouth daily.     ranitidine 150 MG capsule  Commonly known as:  ZANTAC  Take 150 mg by mouth as needed. OTC.     sildenafil 100 MG tablet  Commonly known as:  VIAGRA  Take 100 mg by mouth daily as needed for erectile dysfunction.           Objective:  Physical Exam BP 128/84 mmHg  Pulse 72  Temp(Src) 97.7 F (36.5 C) (Oral)  Ht 6\' 1"  (1.854 m)  Wt 226 lb 8 oz (102.74 kg)  BMI 29.89 kg/m2  SpO2 98% General:   Well developed, well nourished . NAD.  HEENT:  Normocephalic . Face symmetric, atraumatic. Right TM normal Left TM retracted, canal normal without swelling or discharge Throat symmetric, normal Nose minimal congestion Sinuses no TTP Neurologic:  alert & oriented X3.  Speech normal, gait appropriate for age and unassisted Psych--  Cognition and judgment appear intact.  Cooperative with normal attention span and concentration.  Behavior appropriate. No anxious or depressed appearing.        Assessment & Plan:    Serous otitis, Patient most likely have serous otitis following ear infection. Recommend a trial with Flonase, Astelin consistently Discontinue antibiotics Call if not improving, ENT referral?

## 2014-10-29 NOTE — Patient Instructions (Signed)
Continue Claritin Get over-the-counter Flonase: 2 sprays in each side of the nose every morning Get Astelin, a prescription nasal spray, 2 sprays on each side of the nose every night If you are now gradually back to normal please let me know.  If not back to normal in 2 weeks, call for a referral

## 2014-10-31 ENCOUNTER — Other Ambulatory Visit: Payer: Self-pay | Admitting: Internal Medicine

## 2014-11-13 ENCOUNTER — Other Ambulatory Visit: Payer: Self-pay | Admitting: Medical

## 2014-11-14 ENCOUNTER — Other Ambulatory Visit: Payer: Self-pay | Admitting: Medical

## 2014-12-13 ENCOUNTER — Other Ambulatory Visit: Payer: Self-pay | Admitting: Medical

## 2015-01-08 ENCOUNTER — Other Ambulatory Visit: Payer: Self-pay

## 2015-02-16 LAB — HM DIABETES EYE EXAM

## 2015-02-25 ENCOUNTER — Encounter: Payer: Self-pay | Admitting: Internal Medicine

## 2015-02-25 ENCOUNTER — Ambulatory Visit (INDEPENDENT_AMBULATORY_CARE_PROVIDER_SITE_OTHER): Payer: BLUE CROSS/BLUE SHIELD | Admitting: Internal Medicine

## 2015-02-25 VITALS — BP 128/78 | HR 62 | Temp 97.9°F | Ht 73.0 in | Wt 226.5 lb

## 2015-02-25 DIAGNOSIS — I10 Essential (primary) hypertension: Secondary | ICD-10-CM

## 2015-02-25 DIAGNOSIS — L409 Psoriasis, unspecified: Secondary | ICD-10-CM | POA: Diagnosis not present

## 2015-02-25 DIAGNOSIS — Z09 Encounter for follow-up examination after completed treatment for conditions other than malignant neoplasm: Secondary | ICD-10-CM | POA: Insufficient documentation

## 2015-02-25 DIAGNOSIS — E785 Hyperlipidemia, unspecified: Secondary | ICD-10-CM

## 2015-02-25 DIAGNOSIS — Z1159 Encounter for screening for other viral diseases: Secondary | ICD-10-CM

## 2015-02-25 DIAGNOSIS — E119 Type 2 diabetes mellitus without complications: Secondary | ICD-10-CM

## 2015-02-25 DIAGNOSIS — Z23 Encounter for immunization: Secondary | ICD-10-CM | POA: Diagnosis not present

## 2015-02-25 LAB — BASIC METABOLIC PANEL
BUN: 13 mg/dL (ref 6–23)
CALCIUM: 9.1 mg/dL (ref 8.4–10.5)
CO2: 30 mEq/L (ref 19–32)
CREATININE: 0.77 mg/dL (ref 0.40–1.50)
Chloride: 101 mEq/L (ref 96–112)
GFR: 112.42 mL/min (ref >60.00)
Glucose, Bld: 120 mg/dL — ABNORMAL HIGH (ref 70–99)
Potassium: 4 mEq/L (ref 3.5–5.1)
SODIUM: 138 meq/L (ref 135–145)

## 2015-02-25 LAB — HEPATITIS C ANTIBODY: HCV Ab: NEGATIVE

## 2015-02-25 LAB — HM DIABETES FOOT EXAM: HM Diabetic Foot Exam: NORMAL

## 2015-02-25 LAB — HEMOGLOBIN A1C: HEMOGLOBIN A1C: 6.7 % — AB (ref 4.6–6.5)

## 2015-02-25 NOTE — Progress Notes (Signed)
Pre visit review using our clinic review tool, if applicable. No additional management support is needed unless otherwise documented below in the visit note. 

## 2015-02-25 NOTE — Patient Instructions (Signed)
Get your blood work before you leave    Check the  blood pressure 2 or 3 times a month    Be sure your blood pressure is between 110/65 and  145/85.  if it is consistently higher or lower, let me know   Next visit  for a   physical exam by April 2017. Please schedule an appointment at the front desk Please come back fasting

## 2015-02-25 NOTE — Assessment & Plan Note (Signed)
Diabetes: Continue metformin, check A1c, feet exam normal today, had a normal eye exam last week. Hypertension: Continue Lotensin, Lopressor. Check a BMP. Check ambulatory BPs  Psoriasis: Started Otezla approximately 10-2014, prescribed dermatology, doing better Hyperlipidemia: Well-controlled per last BMP, continue Lipitor Primary care: Flu shot and Prevnar today

## 2015-02-25 NOTE — Progress Notes (Signed)
Subjective:    Patient ID: Barry Bush, male    DOB: January 21, 1961, 54 y.o.   MRN: [id number]  DOS:  02/25/2015 Type of visit - description : Routine office visit Interval history:  History of psoriasis, on otezla, doing better. Diabetes: Good compliance with medication, ambulatory blood sugars 117-116 High cholesterol, on Lipitor, denies any unusual aches or pains. Hypertension: No recent ambulatory BPs, BP today is very good, due for a BMP  Review of Systems   no chest pain or difficulty breathing No nausea, vomiting, diarrhea No lower extremity paresthesias. He remains very active and is trying to eat healthy.  Past Medical History  Diagnosis Date  . GERD (gastroesophageal reflux disease)   . Gout   . Hypertension   . Psoriasis     sees derm  . Esophageal stricture 2003    s/p dilation  . Diabetes mellitus 08-2010  . Eustachian tube disorder 10/2014    Dr. Verne Spurr  . Acute serous otitis media of left ear 10/2014    Dr. Verne Spurr    Past Surgical History  Procedure Laterality Date  . Facial trauma      Social History   Social History  . Marital Status: Married    Spouse Name: N/A  . Number of Children: 1  . Years of Education: N/A   Occupational History  . 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     Social History Main Topics  . Smoking status: Never Smoker   . Smokeless tobacco: Never Used  . Alcohol Use: Yes     Comment: 3 days a week  . Drug Use: No  . Sexual Activity: Not on file   Other Topics Concern  . Not on file   Social History Narrative        Medication List       This list is accurate as of: 02/25/15  6:50 PM.  Always use your most recent med list.               allopurinol 100 MG tablet  Commonly known as:  ZYLOPRIM  Take 1 tablet (100 mg total) by mouth 2 (two) times daily.     aspirin 81 MG tablet  Take 81 mg by mouth daily.     atorvastatin 10 MG tablet  Commonly known as:  LIPITOR  Take 1 tablet (10 mg  total) by mouth daily.     azelastine 0.1 % nasal spray  Commonly known as:  ASTELIN  Place 2 sprays into both nostrils at bedtime as needed for rhinitis. Use in each nostril as directed     benazepril 20 MG tablet  Commonly known as:  LOTENSIN  Take 1 tablet (20 mg total) by mouth daily.     loratadine 10 MG tablet  Commonly known as:  CLARITIN  TAKE 1 TABLET BY MOUTH DAILY.     metFORMIN 850 MG tablet  Commonly known as:  GLUCOPHAGE  Take 2 tablets with breakfast and 1 tablet with dinner.     metoprolol 100 MG tablet  Commonly known as:  LOPRESSOR  Take 1 tablet (100 mg total) by mouth daily.     omeprazole 20 MG capsule  Commonly known as:  PRILOSEC  Take 20 mg by mouth daily.     OTEZLA 30 MG Tabs  Generic drug:  Apremilast  Take 1 tablet by mouth 2 (two) times daily. Every 12 hours     ranitidine 150 MG capsule  Commonly  known as:  ZANTAC  Take 150 mg by mouth as needed. OTC.     sildenafil 100 MG tablet  Commonly known as:  VIAGRA  Take 100 mg by mouth daily as needed for erectile dysfunction.           Objective:   Physical Exam BP 128/78 mmHg  Pulse 62  Temp(Src) 97.9 F (36.6 C) (Oral)  Ht  (1.854 m)  Wt 226 lb 8 oz (102.74 kg)  BMI 29.89 kg/m2  SpO2 97% General:   Well developed, well nourished . NAD.  HEENT:  Normocephalic . Face symmetric, atraumatic Lungs:  CTA B Normal respiratory effort, no intercostal retractions, no accessory muscle use. Heart: RRR,  no murmur.  No pretibial edema bilaterally  Diabetic feet exam: Skin normal, good pedal pulses, pinprick examination normal. No edema. Neurologic:  alert & oriented X3.  Speech normal, gait appropriate for age and unassisted Psych--  Cognition and judgment appear intact.  Cooperative with normal attention span and concentration.  Behavior appropriate. No anxious or depressed appearing.      Assessment & Plan:   Diabetes: Continue metformin, check A1c, feet exam normal today,  had a normal eye exam last week. Hypertension: Continue Lotensin, Lopressor. Check a BMP. Check ambulatory BPs  Psoriasis: Started Otezla approximately 10-2014, prescribed dermatology, doing better Hyperlipidemia: Well-controlled per last BMP, continue Lipitor Primary care: Flu shot and Prevnar today

## 2015-04-13 ENCOUNTER — Other Ambulatory Visit: Payer: Self-pay | Admitting: Internal Medicine

## 2015-05-16 ENCOUNTER — Other Ambulatory Visit: Payer: Self-pay | Admitting: Internal Medicine

## 2015-08-20 ENCOUNTER — Other Ambulatory Visit: Payer: Self-pay | Admitting: Internal Medicine

## 2015-10-21 ENCOUNTER — Encounter: Payer: BLUE CROSS/BLUE SHIELD | Admitting: Internal Medicine

## 2015-10-27 ENCOUNTER — Encounter: Payer: Self-pay | Admitting: Internal Medicine

## 2015-10-27 ENCOUNTER — Ambulatory Visit (INDEPENDENT_AMBULATORY_CARE_PROVIDER_SITE_OTHER): Payer: BLUE CROSS/BLUE SHIELD | Admitting: Internal Medicine

## 2015-10-27 VITALS — BP 110/76 | HR 85 | Temp 98.0°F | Ht 73.0 in | Wt 227.0 lb

## 2015-10-27 DIAGNOSIS — Z0000 Encounter for general adult medical examination without abnormal findings: Secondary | ICD-10-CM | POA: Diagnosis not present

## 2015-10-27 DIAGNOSIS — E119 Type 2 diabetes mellitus without complications: Secondary | ICD-10-CM | POA: Diagnosis not present

## 2015-10-27 DIAGNOSIS — M109 Gout, unspecified: Secondary | ICD-10-CM

## 2015-10-27 DIAGNOSIS — Z09 Encounter for follow-up examination after completed treatment for conditions other than malignant neoplasm: Secondary | ICD-10-CM

## 2015-10-27 LAB — COMPLETE METABOLIC PANEL WITH GFR
ALBUMIN: 4.3 g/dL (ref 3.6–5.1)
ALT: 19 U/L (ref 9–46)
AST: 14 U/L (ref 10–35)
Alkaline Phosphatase: 45 U/L (ref 40–115)
BUN: 15 mg/dL (ref 7–25)
CHLORIDE: 105 mmol/L (ref 98–110)
CO2: 21 mmol/L (ref 20–31)
Calcium: 9.1 mg/dL (ref 8.6–10.3)
Creat: 0.84 mg/dL (ref 0.70–1.33)
GFR, Est Non African American: >89 mL/min (ref >=60)
Glucose, Bld: 126 mg/dL — ABNORMAL HIGH (ref 65–99)
Potassium: 4.2 mmol/L (ref 3.5–5.3)
Sodium: 139 mmol/L (ref 135–146)
Total Bilirubin: 0.3 mg/dL (ref 0.2–1.2)
Total Protein: 6.2 g/dL (ref 6.1–8.1)

## 2015-10-27 NOTE — Progress Notes (Signed)
Subjective:    Patient ID: Barry Bush, male    DOB: 1961-05-11, 55 y.o.   MRN: [id number]  DOS:  10/27/2015 Type of visit - description : CPX Interval history: No major concerns, reports he is not doing very well with diet and exercise. Good medication compliance   Review of Systems Constitutional: No fever. No chills. No unexplained wt changes. No unusual sweats  HEENT: No dental problems, no ear discharge, no facial swelling, no voice changes. No eye discharge, no eye  redness , no  intolerance to light   Respiratory: No wheezing , no  difficulty breathing. No cough , no mucus production  Cardiovascular: No CP, no leg swelling , no  Palpitations  GI: no nausea, no vomiting, no diarrhea , no  abdominal pain.  No blood in the stools. No dysphagia, no odynophagia    Endocrine: No polyphagia, no polyuria , no polydipsia  GU: No dysuria, gross hematuria, difficulty urinating. No urinary urgency, no frequency.  Musculoskeletal: No joint swellings or unusual aches or pains  Skin: No change in the color of the skin, palor , no  Rash  Allergic, immunologic: No environmental allergies , no  food allergies  Neurological: No dizziness no  syncope. No headaches. No diplopia, no slurred, no slurred speech, no motor deficits, no facial  Numbness Occasional numbness at the tip of the big toes bilaterally and some burning at night. Hematological: No enlarged lymph nodes, no easy bruising , no unusual bleedings  Psychiatry: No suicidal ideas, no hallucinations, no beavior problems, no confusion.  No unusual/severe anxiety, no depression   Past Medical History  Diagnosis Date  . GERD (gastroesophageal reflux disease)   . Gout   . Hypertension   . Psoriasis     sees derm  . Esophageal stricture 2003    s/p dilation  . Diabetes mellitus 08-2010  . Eustachian tube disorder 10/2014    Dr. Verne Spurr  . Acute serous otitis media of left ear 10/2014    Dr. Verne Spurr  . PSORIASIS  10/16/2006    Qualifier: Diagnosis of  By: Shary Decamp      Past Surgical History  Procedure Laterality Date  . Facial trauma      Social History   Social History  . Marital Status: Married    Spouse Name: N/A  . Number of Children: 1  . Years of Education: N/A   Occupational History  . 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     Social History Main Topics  . Smoking status: Never Smoker   . Smokeless tobacco: Never Used  . Alcohol Use: Yes     Comment: 3 days a week  . Drug Use: No  . Sexual Activity: Not on file   Other Topics Concern  . Not on file   Social History Narrative     Family History  Problem Relation Age of Onset  . Diabetes Father   . Diabetes Mother   . Lung cancer      GM  . Melanoma Other     GF  . Colon cancer Neg Hx   . Prostate cancer Neg Hx   . Heart attack Father     2 stents 2012, onset at age 16      Medication List       This list is accurate as of: 10/27/15 11:59 PM.  Always use your most recent med list.  allopurinol 100 MG tablet  Commonly known as:  ZYLOPRIM  Take 1 tablet (100 mg total) by mouth 2 (two) times daily.     aspirin 81 MG tablet  Take 81 mg by mouth daily.     atorvastatin 10 MG tablet  Commonly known as:  LIPITOR  Take 1 tablet (10 mg total) by mouth daily.     azelastine 0.1 % nasal spray  Commonly known as:  ASTELIN  Place 2 sprays into both nostrils at bedtime as needed for rhinitis. Use in each nostril as directed     benazepril 20 MG tablet  Commonly known as:  LOTENSIN  Take 1 tablet (20 mg total) by mouth daily.     loratadine 10 MG tablet  Commonly known as:  CLARITIN  TAKE 1 TABLET BY MOUTH DAILY.     metFORMIN 850 MG tablet  Commonly known as:  GLUCOPHAGE  TAKE 2 TABLETS WITH BREAKFAST AND 1 TABLET WITH DINNER.     metoprolol 100 MG tablet  Commonly known as:  LOPRESSOR  Take 1 tablet (100 mg total) by mouth daily.     omeprazole 20 MG capsule  Commonly known  as:  PRILOSEC  Take 20 mg by mouth daily.     ranitidine 150 MG capsule  Commonly known as:  ZANTAC  Take 150 mg by mouth as needed. OTC.     sildenafil 100 MG tablet  Commonly known as:  VIAGRA  Take 100 mg by mouth daily as needed for erectile dysfunction.           Objective:   Physical Exam BP 110/76 mmHg  Pulse 85  Temp(Src) 98 F (36.7 C) (Oral)  Ht 6\' 1"  (1.854 m)  Wt 227 lb (102.967 kg)  BMI 29.96 kg/m2  SpO2 98%  General:   Well developed, well nourished . NAD.  Neck: No  Thyromegaly  HEENT:  Normocephalic . Face symmetric, atraumatic Lungs:  CTA B Normal respiratory effort, no intercostal retractions, no accessory muscle use. Heart: RRR,  no murmur.  No pretibial edema bilaterally  Abdomen:  Not distended, soft, non-tender. No rebound or rigidity.   DIABETIC FEET EXAM: No lower extremity edema Normal pedal pulses bilaterally Skin normal, nails normal, no calluses Pinprick examination : slt decreased B great toes Rectal:  External abnormalities: none. Normal sphincter tone. No rectal masses or tenderness.  Stool not found  Prostate: Prostate gland firm and smooth, no enlargement, nodularity, tenderness, mass, asymmetry or induration.  Skin: Exposed areas without rash. Not pale. Not jaundice Neurologic:  alert & oriented X3.  Speech normal, gait appropriate for age and unassisted Strength symmetric and appropriate for age.  Psych: Cognition and judgment appear intact.  Cooperative with normal attention span and concentration.  Behavior appropriate. No anxious or depressed appearing.    Assessment & Plan:   Assessment DM  dx 2012 Mild neuropathy dx 10-2015 HTN GERD Esophageal stricture, dilatation 2003 Gout Psoriasis --- sees dermatology, [facility] worked but $$$$ Eustachian  tube disorder, saw ENT 2016  Plan: Chronic medical problems: Good compliance with medications, check labs, refill medications as needed, has mild  neuropathy, feet care   discussed, strongly recommend to follow a healthier diet and stay active.  RTC 6 months

## 2015-10-27 NOTE — Assessment & Plan Note (Signed)
Td 07 PNM 2015 Prevnar-- 2016  cscope 04-2013, pt told 10 years  Prostate cancer screening: DRE (-) today, check a psa   Discussed diet and exercise Labs:

## 2015-10-27 NOTE — Patient Instructions (Signed)
GO TO THE LAB : Get the blood work     GO TO THE FRONT DESK Schedule your next appointment for a  routine checkup in 6 months    Diabetes and Foot Care Diabetes may cause you to have problems because of poor blood supply (circulation) to your feet and legs. This may cause the skin on your feet to become thinner, break easier, and heal more slowly. Your skin may become dry, and the skin may peel and crack. You may also have nerve damage in your legs and feet causing decreased feeling in them. You may not notice minor injuries to your feet that could lead to infections or more serious problems. Taking care of your feet is one of the most important things you can do for yourself.  HOME CARE INSTRUCTIONS  Wear shoes at all times, even in the house. Do not go barefoot. Bare feet are easily injured.  Check your feet daily for blisters, cuts, and redness. If you cannot see the bottom of your feet, use a mirror or ask someone for help.  Wash your feet with warm water (do not use hot water) and mild soap. Then pat your feet and the areas between your toes until they are completely dry. Do not soak your feet as this can dry your skin.  Apply a moisturizing lotion or petroleum jelly (that does not contain alcohol and is unscented) to the skin on your feet and to dry, brittle toenails. Do not apply lotion between your toes.  Trim your toenails straight across. Do not dig under them or around the cuticle. File the edges of your nails with an emery board or nail file.  Do not cut corns or calluses or try to remove them with medicine.  Wear clean socks or stockings every day. Make sure they are not too tight. Do not wear knee-high stockings since they may decrease blood flow to your legs.  Wear shoes that fit properly and have enough cushioning. To break in new shoes, wear them for just a few hours a day. This prevents you from injuring your feet. Always look in your shoes before you put them on to be sure  there are no objects inside.  Do not cross your legs. This may decrease the blood flow to your feet.  If you find a minor scrape, cut, or break in the skin on your feet, keep it and the skin around it clean and dry. These areas may be cleansed with mild soap and water. Do not cleanse the area with peroxide, alcohol, or iodine.  When you remove an adhesive bandage, be sure not to damage the skin around it.  If you have a wound, look at it several times a day to make sure it is healing.  Do not use heating pads or hot water bottles. They may burn your skin. If you have lost feeling in your feet or legs, you may not know it is happening until it is too late.  Make sure your health care provider performs a complete foot exam at least annually or more often if you have foot problems. Report any cuts, sores, or bruises to your health care provider immediately. SEEK MEDICAL CARE IF:   You have an injury that is not healing.  You have cuts or breaks in the skin.  You have an ingrown nail.  You notice redness on your legs or feet.  You feel burning or tingling in your legs or feet.  You   have pain or cramps in your legs and feet.  Your legs or feet are numb.  Your feet always feel cold. SEEK IMMEDIATE MEDICAL CARE IF:   There is increasing redness, swelling, or pain in or around a wound.  There is a red line that goes up your leg.  Pus is coming from a wound.  You develop a fever or as directed by your health care provider.  You notice a bad smell coming from an ulcer or wound.   This information is not intended to replace advice given to you by your health care provider. Make sure you discuss any questions you have with your health care provider.   Document Released: 06/10/2000 Document Revised: 02/13/2013 Document Reviewed: 11/20/2012 Elsevier Interactive Patient Education 2016 Elsevier Inc.  

## 2015-10-27 NOTE — Progress Notes (Signed)
Pre visit review using our clinic review tool, if applicable. No additional management support is needed unless otherwise documented below in the visit note. 

## 2015-10-28 LAB — LIPID PANEL
Cholesterol: 154 mg/dL (ref 0–200)
HDL: 50.3 mg/dL (ref >39.00)
LDL Cholesterol: 65 mg/dL (ref 0–99)
NonHDL: 103.35
TRIGLYCERIDES: 194 mg/dL — AB (ref 0.0–149.0)
Total CHOL/HDL Ratio: 3
VLDL: 38.8 mg/dL (ref 0.0–40.0)

## 2015-10-28 LAB — CBC WITH DIFFERENTIAL/PLATELET
Basophils Absolute: 0 10*3/uL (ref 0.0–0.1)
Basophils Relative: 0.7 % (ref 0.0–3.0)
EOS PCT: 4.5 % (ref 0.0–5.0)
Eosinophils Absolute: 0.3 10*3/uL (ref 0.0–0.7)
HCT: 38.2 % — ABNORMAL LOW (ref 39.0–52.0)
Hemoglobin: 12.9 g/dL — ABNORMAL LOW (ref 13.0–17.0)
LYMPHS ABS: 2.1 10*3/uL (ref 0.7–4.0)
Lymphocytes Relative: 29.9 % (ref 12.0–46.0)
MCHC: 33.8 g/dL (ref 30.0–36.0)
MCV: 90.9 fl (ref 78.0–100.0)
MONO ABS: 0.5 10*3/uL (ref 0.1–1.0)
MONOS PCT: 6.9 % (ref 3.0–12.0)
NEUTROS ABS: 4.1 10*3/uL (ref 1.4–7.7)
NEUTROS PCT: 58 % (ref 43.0–77.0)
PLATELETS: 268 10*3/uL (ref 150.0–400.0)
RBC: 4.21 Mil/uL — ABNORMAL LOW (ref 4.22–5.81)
RDW: 13.4 % (ref 11.5–15.5)
WBC: 7 10*3/uL (ref 4.0–10.5)

## 2015-10-28 LAB — HEMOGLOBIN A1C: Hgb A1c MFr Bld: 6.8 % — ABNORMAL HIGH (ref 4.6–6.5)

## 2015-10-28 LAB — URIC ACID: URIC ACID, SERUM: 6.9 mg/dL (ref 4.0–7.8)

## 2015-10-28 LAB — PSA: PSA: 1.01 ng/mL (ref 0.10–4.00)

## 2015-10-28 NOTE — Assessment & Plan Note (Signed)
Chronic medical problems: Good compliance with medications, check labs, refill medications as needed, has mild  neuropathy, feet care  discussed, strongly recommend to follow a healthier diet and stay active.  RTC 6 months

## 2015-11-05 ENCOUNTER — Encounter: Payer: Self-pay | Admitting: Medical

## 2015-11-05 ENCOUNTER — Ambulatory Visit (INDEPENDENT_AMBULATORY_CARE_PROVIDER_SITE_OTHER): Payer: BLUE CROSS/BLUE SHIELD | Admitting: Medical

## 2015-11-05 VITALS — BP 118/78 | HR 78 | Temp 99.1°F | Ht 73.0 in | Wt 223.8 lb

## 2015-11-05 DIAGNOSIS — J309 Allergic rhinitis, unspecified: Secondary | ICD-10-CM | POA: Diagnosis not present

## 2015-11-05 MED ORDER — AZITHROMYCIN 250 MG PO TABS: ORAL_TABLET | ORAL | Status: DC

## 2015-11-05 MED ORDER — FLUTICASONE PROPIONATE 50 MCG/ACT NA SUSP: 2.0000 | Freq: Every day | NASAL | Status: DC

## 2015-11-05 MED ORDER — HYDROCODONE-HOMATROPINE 5-1.5 MG/5ML PO SYRP: 5.0000 mL | ORAL_SOLUTION | Freq: Three times a day (TID) | ORAL | Status: DC | PRN

## 2015-11-05 MED ORDER — LEVOCETIRIZINE DIHYDROCHLORIDE 5 MG PO TABS
5.0000 mg | ORAL_TABLET | Freq: Every evening | ORAL | Status: DC
Start: 2015-11-05 — End: 2015-12-04

## 2015-11-05 NOTE — Patient Instructions (Addendum)
May have allergies vs vasomotor rhinitis. Will rx flonase and will rx xyzal.   For cough will rx hycodan.   If you have pesristing productive cough, recurrent sinus pressure or lt ear pain then start azithromycin.  Follow up in 7 days or as needed

## 2015-11-05 NOTE — Progress Notes (Signed)
Subjective:    Patient ID: Barry Bush, male    DOB: 06/11/1961, 55 y.o.   MRN: [id number]  HPI   Pt in states since Monday morning has been sick. Pt states he mowed grass on Sunday. Pt woke up Monday and felt nasal congestion. Had dry cough(occasionaly productive) and runny nose. Feels some pnd. Pt states occasional allergies but not chronic/every spring type.   Some transient feeling of fever/chills.   Monday frontal sinus region headache on Monday.    Review of Systems  Constitutional: Negative for fever, chills and fatigue.  HENT: Positive for congestion and postnasal drip. Negative for drooling, facial swelling, rhinorrhea, sinus pressure, sore throat and tinnitus.        Lt ear no pain. But see exam.  Respiratory: Positive for cough. Negative for chest tightness, shortness of breath and wheezing.   Cardiovascular: Negative for chest pain and palpitations.  Gastrointestinal: Negative for abdominal distention and anal bleeding.  Musculoskeletal: Negative for myalgias and back pain.  Skin: Negative for rash.  Neurological: Negative for dizziness and headaches.  Hematological: Negative for adenopathy. Does not bruise/bleed easily.  Psychiatric/Behavioral: Negative for behavioral problems and confusion.    Past Medical History  Diagnosis Date  . GERD (gastroesophageal reflux disease)   . Gout   . Hypertension   . Psoriasis     sees derm  . Esophageal stricture 2003    s/p dilation  . Diabetes mellitus 08-2010  . Eustachian tube disorder 10/2014    Dr. Verne SpurrLeinbach  . Acute serous otitis media of left ear 10/2014    Dr. Verne SpurrLeinbach  . PSORIASIS 10/16/2006    Qualifier: Diagnosis of  By: Shary DecampHawks, Stacia       Social History   Social History  . Marital Status: Married    Spouse Name: N/A  . Number of Children: 1  . Years of Education: N/A   Occupational History  . 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     Social History Main Topics  . Smoking status: Never  Smoker   . Smokeless tobacco: Never Used  . Alcohol Use: Yes     Comment: 3 days a week  . Drug Use: No  . Sexual Activity: Not on file   Other Topics Concern  . Not on file   Social History Narrative    Past Surgical History  Procedure Laterality Date  . Facial trauma      Family History  Problem Relation Age of Onset  . Diabetes Father   . Diabetes Mother   . Lung cancer      GM  . Melanoma Other     GF  . Colon cancer Neg Hx   . Prostate cancer Neg Hx   . Heart attack Father     2 stents 2012, onset at age 54    No Known Allergies  Current Outpatient Prescriptions on File Prior to Visit  Medication Sig Dispense Refill  . allopurinol (ZYLOPRIM) 100 MG tablet Take 1 tablet (100 mg total) by mouth 2 (two) times daily. 180 tablet 1  . aspirin 81 MG tablet Take 81 mg by mouth daily.    [name]. atorvastatin (LIPITOR) 10 MG tablet Take 1 tablet (10 mg total) by mouth daily. 90 tablet 2  . azelastine (ASTELIN) 0.1 % nasal spray Place 2 sprays into both nostrils at bedtime as needed for rhinitis. Use in each nostril as directed 30 mL 3  . benazepril (LOTENSIN) 20 MG tablet  Take 1 tablet (20 mg total) by mouth daily. 90 tablet 1  . loratadine (CLARITIN) 10 MG tablet TAKE 1 TABLET BY MOUTH DAILY. 30 tablet 0  . metFORMIN (GLUCOPHAGE) 850 MG tablet TAKE 2 TABLETS WITH BREAKFAST AND 1 TABLET WITH DINNER. 270 tablet 2  . metoprolol (LOPRESSOR) 100 MG tablet Take 1 tablet (100 mg total) by mouth daily. 90 tablet 2  . omeprazole (PRILOSEC) 20 MG capsule Take 20 mg by mouth daily.      . ranitidine (ZANTAC) 150 MG capsule Take 150 mg by mouth as needed. OTC.     . sildenafil (VIAGRA) 100 MG tablet Take 100 mg by mouth daily as needed for erectile dysfunction.     No current facility-administered medications on file prior to visit.    BP 118/78 mmHg  Pulse 78  Temp(Src) 99.1 F (37.3 C) (Oral)  Ht  (1.854 m)  Wt 223 lb 12.8 oz (101.515 kg)  BMI 29.53 kg/m2  SpO2  98%    .     Objective:   Physical Exam  General  Mental Status - Alert. General Appearance - Well groomed. Not in acute distress.  Skin Rashes- No Rashes.  HEENT Head- Normal. Ear Auditory Canal - Left- Normal. Right - Normal.Tympanic Membrane- Left- mild tm mild red center. Right- Normal. Eye Sclera/Conjunctiva- Left- Normal. Right- Normal. Nose & Sinuses Nasal Mucosa- Left-  Boggy and Congested. Right-  Boggy and  Congested.Bilateral no  maxillary and no  frontal sinus pressure. Mouth & Throat Lips: Upper Lip- Normal: no dryness, cracking, pallor, cyanosis, or vesicular eruption. Lower Lip-Normal: no dryness, cracking, pallor, cyanosis or vesicular eruption. Buccal Mucosa- Bilateral- No Aphthous ulcers. Oropharynx- No Discharge or Erythema. +pnd. Tonsils: Characteristics- Bilateral- No Erythema or Congestion. Size/Enlargement- Bilateral- No enlargement. Discharge- bilateral-None.  Neck Neck- Supple. No Masses.   Chest and Lung Exam Auscultation: Breath Sounds:-Clear even and unlabored.  Cardiovascular Auscultation:Rythm- Regular, rate and rhythm. Murmurs & Other Heart Sounds:Ausculatation of the heart reveal- No Murmurs.  Lymphatic Head & Neck General Head & Neck Lymphatics: Bilateral: Description- No Localized lymphadenopathy.       Assessment & Plan:  May have allergies vs vasomotor rhinitis. Will rx flonase and will rx xyzal.   For cough will rx hycodan.   If you have pesristing productive cough, recurrent sinus pressure or lt ear pain then start azithromycin.  Follow up in 7 days or as needed  Nawal Burling, Ramon Dredge, [employer]

## 2015-11-05 NOTE — Progress Notes (Signed)
Pre visit review using our clinic review tool, if applicable. No additional management support is needed unless otherwise documented below in the visit note. 

## 2015-11-10 DIAGNOSIS — Z79899 Other long term (current) drug therapy: Secondary | ICD-10-CM | POA: Diagnosis not present

## 2015-11-10 DIAGNOSIS — L409 Psoriasis, unspecified: Secondary | ICD-10-CM | POA: Diagnosis not present

## 2015-12-04 ENCOUNTER — Other Ambulatory Visit: Payer: Self-pay | Admitting: Medical

## 2015-12-04 NOTE — Telephone Encounter (Signed)
Refilled his xyzal.

## 2016-01-02 ENCOUNTER — Other Ambulatory Visit: Payer: Self-pay | Admitting: Medical

## 2016-01-29 ENCOUNTER — Other Ambulatory Visit: Payer: Self-pay | Admitting: Internal Medicine

## 2016-02-08 ENCOUNTER — Other Ambulatory Visit: Payer: Self-pay | Admitting: Internal Medicine

## 2016-03-31 ENCOUNTER — Other Ambulatory Visit: Payer: Self-pay | Admitting: Medical

## 2016-04-28 ENCOUNTER — Ambulatory Visit (INDEPENDENT_AMBULATORY_CARE_PROVIDER_SITE_OTHER): Payer: BLUE CROSS/BLUE SHIELD | Admitting: Internal Medicine

## 2016-04-28 ENCOUNTER — Encounter: Payer: Self-pay | Admitting: Internal Medicine

## 2016-04-28 ENCOUNTER — Other Ambulatory Visit: Payer: Self-pay | Admitting: Medical

## 2016-04-28 VITALS — BP 178/96 | HR 69 | Temp 98.0°F | Ht 73.0 in | Wt 226.0 lb

## 2016-04-28 DIAGNOSIS — E119 Type 2 diabetes mellitus without complications: Secondary | ICD-10-CM | POA: Diagnosis not present

## 2016-04-28 DIAGNOSIS — I10 Essential (primary) hypertension: Secondary | ICD-10-CM | POA: Diagnosis not present

## 2016-04-28 DIAGNOSIS — Z23 Encounter for immunization: Secondary | ICD-10-CM

## 2016-04-28 LAB — BASIC METABOLIC PANEL
BUN: 16 mg/dL (ref 6–23)
CHLORIDE: 104 meq/L (ref 96–112)
CO2: 28 mEq/L (ref 19–32)
Calcium: 9.3 mg/dL (ref 8.4–10.5)
Creatinine, Ser: 0.73 mg/dL (ref 0.40–1.50)
GFR: 119.02 mL/min (ref >60.00)
GLUCOSE: 127 mg/dL — AB (ref 70–99)
POTASSIUM: 4.7 meq/L (ref 3.5–5.1)
Sodium: 139 mEq/L (ref 135–145)

## 2016-04-28 LAB — HEMOGLOBIN A1C: Hgb A1c MFr Bld: 6.5 % (ref 4.6–6.5)

## 2016-04-28 NOTE — Progress Notes (Signed)
Pre visit review using our clinic review tool, if applicable. No additional management support is needed unless otherwise documented below in the visit note. 

## 2016-04-28 NOTE — Progress Notes (Signed)
Subjective:    Patient ID: Barry Bush, male    DOB: 02-02-1961, 55 y.o.   MRN: [id number]  DOS:  04/28/2016 Type of visit - description : Routine checkup Interval history: Good compliance with diabetes medications,  ambulatory blood sugar usually in the 120s.  BP elevated today.  cut his right heel few days ago, no discharge fever or chills. No recent episodes of gout. Taking allopurinol.   Review of Systems   Past Medical History:  Diagnosis Date  . Acute serous otitis media of left ear 10/2014   Dr. Verne SpurrLeinbach  . Diabetes mellitus 08-2010  . Esophageal stricture 2003   s/p dilation  . Eustachian tube disorder 10/2014   Dr. Verne SpurrLeinbach  . GERD (gastroesophageal reflux disease)   . Gout   . Hypertension   . Psoriasis    sees derm  . PSORIASIS 10/16/2006   Qualifier: Diagnosis of  By: Shary DecampHawks, Stacia      Past Surgical History:  Procedure Laterality Date  . Facial Trauma      Social History   Social History  . Marital status: Married    Spouse name: N/A  . Number of children: 1  . Years of education: N/A   Occupational History  . 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  Self   Social History Main Topics  . Smoking status: Never Smoker  . Smokeless tobacco: Never Used  . Alcohol use Yes     Comment: 3 days a week  . Drug use: No  . Sexual activity: Not on file   Other Topics Concern  . Not on file   Social History Narrative  . No narrative on file        Medication List       Accurate as of 04/28/16 11:59 PM. Always use your most recent med list.          allopurinol 100 MG tablet Commonly known as:  ZYLOPRIM Take 100 mg by mouth daily.   aspirin 81 MG tablet Take 81 mg by mouth daily.   atorvastatin 10 MG tablet Commonly known as:  LIPITOR Take 1 tablet (10 mg total) by mouth daily.   benazepril 20 MG tablet Commonly known as:  LOTENSIN Take 1 tablet (20 mg total) by mouth daily.   loratadine 10 MG tablet Commonly known as:   CLARITIN TAKE 1 TABLET BY MOUTH DAILY.   metFORMIN 850 MG tablet Commonly known as:  GLUCOPHAGE TAKE 2 TABLETS WITH BREAKFAST AND 1 TABLET WITH DINNER.   metoprolol 100 MG tablet Commonly known as:  LOPRESSOR Take 1 tablet (100 mg total) by mouth daily.   omeprazole 20 MG capsule Commonly known as:  PRILOSEC Take 20 mg by mouth daily.   ranitidine 150 MG capsule Commonly known as:  ZANTAC Take 150 mg by mouth as needed. OTC.   sildenafil 100 MG tablet Commonly known as:  VIAGRA Take 100 mg by mouth daily as needed for erectile dysfunction.          Objective:   Physical Exam BP (!) 178/96 (BP Location: Right Arm, Patient Position: Sitting, Cuff Size: Large)   Pulse 69   Temp 98 F (36.7 C) (Oral)   Ht 6\' 1"  (1.854 m)   Wt 226 lb (102.5 kg)   SpO2 98%   BMI 29.82 kg/m  General:   Well developed, well nourished . NAD.  HEENT:  Normocephalic . Face symmetric, atraumatic Lungs:  CTA B Normal respiratory effort,  no intercostal retractions, no accessory muscle use. Heart: RRR,  no murmur.  No pretibial edema bilaterally  DIABETIC FEET EXAM: No lower extremity edema Normal pedal pulses bilaterally  nails normal, no calluses Pinprick examination of the feet normal. At the right Achilles tendon area,  has an excoriation, minimal redness, no discharge, odor or pus Neurologic:  alert & oriented X3.  Speech normal, gait appropriate for age and unassisted Psych--  Cognition and judgment appear intact.  Cooperative with normal attention span and concentration.  Behavior appropriate. No anxious or depressed appearing.      Assessment & Plan:   Assessment DM  dx 2012 Mild neuropathy dx 10-2015 HTN GERD Esophageal stricture, dilatation 2003 Gout Psoriasis --- [place]ology, [place] but $$$$ Eustachian  tube disorder, saw ENT 2016  PLAN: DM: Continue metformin, check A1c. HTN: BP is slightly elevated, I recheck it myself in both arms: 160/100. No  ambulatory BPs. Recommend ambulatory BPs, low salt, no NSAIDs. See my nurse in 2 weeks for a check. See instructions. Check BMP Gout: Taking only one allopurinol daily, no recent episodes excoriation right heel: No signs of infection, recommend to monitor the area and  let me know if infx. Flu shot today RTC 2 weeks to see my nurse, 6 months for a CPX

## 2016-04-28 NOTE — Patient Instructions (Signed)
GO TO THE LAB : Get the blood work     GO TO THE FRONT DESK  Schedule a visit with my nurse in 2 weeks, we need to recheck your blood pressure  Schedule your next appointment for a  physical exam in 6 months   Your BP is slightly high today Check the  blood pressure 3 to 4times a   Week  Be sure your blood pressure is between 110/65 and  135/85. If it is consistently higher or lower, let me know  Drink plenty of fluids, try to eat low in salt, avoid medications such as naproxen or ibuprofen

## 2016-04-29 NOTE — Assessment & Plan Note (Signed)
DM: Continue metformin, check A1c. HTN: BP is slightly elevated, I recheck it myself in both arms: 160/100. No ambulatory BPs. Recommend ambulatory BPs, low salt, no NSAIDs. See my nurse in 2 weeks for a check. See instructions. Check BMP Gout: Taking only one allopurinol daily, no recent episodes excoriation right heel: No signs of infection, recommend to monitor the area and  let me know if infx. Flu shot today RTC 2 weeks to see my nurse, 6 months for a CPX

## 2016-04-30 ENCOUNTER — Other Ambulatory Visit: Payer: Self-pay | Admitting: Internal Medicine

## 2016-05-07 ENCOUNTER — Other Ambulatory Visit: Payer: Self-pay | Admitting: Internal Medicine

## 2016-05-12 ENCOUNTER — Ambulatory Visit (INDEPENDENT_AMBULATORY_CARE_PROVIDER_SITE_OTHER): Payer: BLUE CROSS/BLUE SHIELD | Admitting: Internal Medicine

## 2016-05-12 VITALS — BP 154/83 | HR 71

## 2016-05-12 DIAGNOSIS — I10 Essential (primary) hypertension: Secondary | ICD-10-CM | POA: Diagnosis not present

## 2016-05-12 MED ORDER — LOSARTAN POTASSIUM 100 MG PO TABS: 100.0000 mg | ORAL_TABLET | Freq: Every day | ORAL | 1 refills | Status: DC

## 2016-05-12 NOTE — Patient Instructions (Addendum)
Per Dr. Drue NovelPaz: Stop taking Benazpril & begin Losartan 100 MG daily. Continue all other medications & regimen. Complete BMP lab work in 3 weeks. Follow-up with PCP in 3 months for an office visit. Monitor blood pressure daily.

## 2016-05-12 NOTE — Progress Notes (Addendum)
Pre visit review using our clinic review tool, if applicable. No additional management support is needed unless otherwise documented below in the visit note.  Patient presents in office for blood pressure check per OV note 04/28/16. Reviewed medications & regimen with the patient. Today's readings were as follow: BP 154/89 P 70 & BP 154/83 P 71.  Per Dr. Drue NovelPaz: Stop taking Benazepril & begin Losartan 100 MG daily. Continue all other medications & regimen. Complete BMP lab work in 3 weeks. Follow-up with PCP in 3 months for an office visit. Monitor blood pressure daily.  Informed patient of the provider's instructions. He verbalized understanding and did not have any further questions or concerns.  Lab appointment scheduled for 06/02/16 at 7:15 AM. He will call the office to make the 3 month follow-up appointment.   Willow OraJose Paz, MD

## 2016-05-13 ENCOUNTER — Other Ambulatory Visit: Payer: Self-pay | Admitting: Internal Medicine

## 2016-05-14 ENCOUNTER — Other Ambulatory Visit: Payer: Self-pay | Admitting: Internal Medicine

## 2016-06-02 ENCOUNTER — Other Ambulatory Visit (INDEPENDENT_AMBULATORY_CARE_PROVIDER_SITE_OTHER): Payer: BLUE CROSS/BLUE SHIELD

## 2016-06-02 DIAGNOSIS — I10 Essential (primary) hypertension: Secondary | ICD-10-CM | POA: Diagnosis not present

## 2016-06-02 LAB — BASIC METABOLIC PANEL
BUN: 15 mg/dL (ref 6–23)
CHLORIDE: 104 meq/L (ref 96–112)
CO2: 28 mEq/L (ref 19–32)
Calcium: 9.5 mg/dL (ref 8.4–10.5)
Creatinine, Ser: 0.94 mg/dL (ref 0.40–1.50)
GFR: 88.87 mL/min (ref >60.00)
Glucose, Bld: 124 mg/dL — ABNORMAL HIGH (ref 70–99)
POTASSIUM: 4.8 meq/L (ref 3.5–5.1)
SODIUM: 140 meq/L (ref 135–145)

## 2016-07-25 ENCOUNTER — Other Ambulatory Visit: Payer: Self-pay | Admitting: Internal Medicine

## 2016-07-26 ENCOUNTER — Other Ambulatory Visit: Payer: Self-pay | Admitting: Medical

## 2016-09-14 ENCOUNTER — Other Ambulatory Visit: Payer: Self-pay | Admitting: Internal Medicine

## 2016-09-14 ENCOUNTER — Telehealth: Payer: Self-pay

## 2016-09-14 MED ORDER — SILDENAFIL CITRATE 100 MG PO TABS: 100.0000 mg | ORAL_TABLET | Freq: Every day | ORAL | 4 refills | Status: DC | PRN

## 2016-09-14 MED ORDER — SILDENAFIL CITRATE 100 MG PO TABS: 100.0000 mg | ORAL_TABLET | Freq: Every day | ORAL | 5 refills | Status: DC | PRN

## 2016-09-14 NOTE — Telephone Encounter (Signed)
Received PA response. PA not needed, however, plan will only cover 4 tabs in 30 day period. Rx re-sent to reflect.

## 2016-09-14 NOTE — Telephone Encounter (Signed)
PA initiated via Covermymeds; KEY: [id number]. Awaiting determination.

## 2016-10-26 ENCOUNTER — Encounter: Payer: Self-pay | Admitting: Internal Medicine

## 2016-10-26 ENCOUNTER — Ambulatory Visit (INDEPENDENT_AMBULATORY_CARE_PROVIDER_SITE_OTHER): Payer: BLUE CROSS/BLUE SHIELD | Admitting: Internal Medicine

## 2016-10-26 VITALS — BP 122/84 | HR 72 | Temp 98.5°F | Resp 14 | Ht 73.0 in | Wt 228.2 lb

## 2016-10-26 DIAGNOSIS — Z0000 Encounter for general adult medical examination without abnormal findings: Secondary | ICD-10-CM

## 2016-10-26 DIAGNOSIS — E119 Type 2 diabetes mellitus without complications: Secondary | ICD-10-CM

## 2016-10-26 LAB — CBC WITH DIFFERENTIAL/PLATELET
Basophils Absolute: 0.1 10*3/uL (ref 0.0–0.1)
Basophils Relative: 1.2 % (ref 0.0–3.0)
EOS PCT: 4.7 % (ref 0.0–5.0)
Eosinophils Absolute: 0.3 10*3/uL (ref 0.0–0.7)
HEMATOCRIT: 37.9 % — AB (ref 39.0–52.0)
HEMOGLOBIN: 13 g/dL (ref 13.0–17.0)
Lymphocytes Relative: 28 % (ref 12.0–46.0)
Lymphs Abs: 1.7 10*3/uL (ref 0.7–4.0)
MCHC: 34.2 g/dL (ref 30.0–36.0)
MCV: 91.6 fl (ref 78.0–100.0)
MONOS PCT: 9.3 % (ref 3.0–12.0)
Monocytes Absolute: 0.6 10*3/uL (ref 0.1–1.0)
Neutro Abs: 3.5 10*3/uL (ref 1.4–7.7)
Neutrophils Relative %: 56.8 % (ref 43.0–77.0)
Platelets: 280 10*3/uL (ref 150.0–400.0)
RBC: 4.14 Mil/uL — ABNORMAL LOW (ref 4.22–5.81)
RDW: 12.7 % (ref 11.5–15.5)
WBC: 6.1 10*3/uL (ref 4.0–10.5)

## 2016-10-26 LAB — LIPID PANEL
CHOL/HDL RATIO: 3
CHOLESTEROL: 135 mg/dL (ref 0–200)
HDL: 46.9 mg/dL (ref >39.00)
LDL CALC: 64 mg/dL (ref 0–99)
NonHDL: 87.88
Triglycerides: 120 mg/dL (ref 0.0–149.0)
VLDL: 24 mg/dL (ref 0.0–40.0)

## 2016-10-26 LAB — HEPATIC FUNCTION PANEL
ALT: 24 U/L (ref 0–53)
AST: 17 U/L (ref 0–37)
Albumin: 4.2 g/dL (ref 3.5–5.2)
Alkaline Phosphatase: 44 U/L (ref 39–117)
BILIRUBIN DIRECT: 0.1 mg/dL (ref 0.0–0.3)
BILIRUBIN TOTAL: 0.4 mg/dL (ref 0.2–1.2)
Total Protein: 6.7 g/dL (ref 6.0–8.3)

## 2016-10-26 LAB — IRON: Iron: 91 ug/dL (ref 42–165)

## 2016-10-26 LAB — HEMOGLOBIN A1C: Hgb A1c MFr Bld: 6.9 % — ABNORMAL HIGH (ref 4.6–6.5)

## 2016-10-26 LAB — FERRITIN: Ferritin: 91.9 ng/mL (ref 22.0–322.0)

## 2016-10-26 NOTE — Progress Notes (Signed)
Pre visit review using our clinic review tool, if applicable. No additional management support is needed unless otherwise documented below in the visit note. 

## 2016-10-26 NOTE — Assessment & Plan Note (Signed)
--  Td 2015;  PNM 2015;  Prevnar-- 2016 -- CCS:  cscope 04-2013, pt told 10 years  --Prostate cancer screening: DRE,psa wnl 2017 --Discussed diet and exercise --Labs:  FLP, LFTs, CBC, iron, ferritin, A1c

## 2016-10-26 NOTE — Assessment & Plan Note (Signed)
DM: Currently on metformin, check A1c. Needs to improve diet, recommend self learning. See AVS HTN: BP today is very good, he rarely checks BPs at home, one time was 150. Continue losartan, Lopressor and check BPs weekly. Goals discussed. Gout: On allopurinol, no recent attacks, last uric acid satisfactory. Last hemoglobin is slightly low, recheck a CBC, iron and ferritin. Denies nausea, vomiting, blood in the stools. RTC 6 months

## 2016-10-26 NOTE — Progress Notes (Signed)
Subjective:    Patient ID: Barry Bush, male    DOB: May 16, 1961, 56 y.o.   MRN: [id number]  DOS:  10/26/2016 Type of visit - description : cpx Interval history: We also talked about his chronic medical problems.   Review of Systems Taking Otezla for psoriasis, causes diarrhea from time to time. Otherwise he is doing very well   Other than above, a 14 point review of systems is negative      Past Medical History:  Diagnosis Date  . Acute serous otitis media of left ear 10/2014   Dr. Verne Spurr  . Diabetes mellitus 08-2010  . Esophageal stricture 2003   s/p dilation  . Eustachian tube disorder 10/2014   Dr. Verne Spurr  . GERD (gastroesophageal reflux disease)   . Gout   . Hypertension   . Psoriasis    sees derm  . PSORIASIS 10/16/2006   Qualifier: Diagnosis of  By: Shary Decamp      Past Surgical History:  Procedure Laterality Date  . Facial Trauma      Social History   Social History  . Marital status: Married    Spouse name: N/A  . Number of children: 1  . Years of education: N/A   Occupational History  . 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  Self   Social History Main Topics  . Smoking status: Never Smoker  . Smokeless tobacco: Never Used  . Alcohol use Yes     Comment: 3 days a week  . Drug use: No  . Sexual activity: Not on file   Other Topics Concern  . Not on file   Social History Narrative  . No narrative on file     Family History  Problem Relation Age of Onset  . Diabetes Father   . Heart attack Father     2 stents 2012, onset at age 75  . Diabetes Mother   . Lung cancer      GM  . Melanoma Other     GF  . Colon cancer Neg Hx   . Prostate cancer Neg Hx     Allergies as of 10/26/2016   No Known Allergies     Medication List       Accurate as of 10/26/16  6:08 PM. Always use your most recent med list.          allopurinol 100 MG tablet Commonly known as:  ZYLOPRIM Take 1 tablet (100 mg total) by mouth 2 (two)  times daily.   aspirin 81 MG tablet Take 81 mg by mouth daily.   atorvastatin 10 MG tablet Commonly known as:  LIPITOR Take 1 tablet (10 mg total) by mouth daily.   fluticasone 50 MCG/ACT nasal spray Commonly known as:  FLONASE Place 2 sprays into both nostrils daily.   loratadine 10 MG tablet Commonly known as:  CLARITIN TAKE 1 TABLET BY MOUTH DAILY.   losartan 100 MG tablet Commonly known as:  COZAAR Take 1 tablet (100 mg total) by mouth daily.   metFORMIN 850 MG tablet Commonly known as:  GLUCOPHAGE Take 2 tablets by mouth with breakfast and 1 tablet by mouth with dinner.   metoprolol 100 MG tablet Commonly known as:  LOPRESSOR Take 1 tablet (100 mg total) by mouth daily.   omeprazole 20 MG capsule Commonly known as:  PRILOSEC Take 20 mg by mouth daily.   OTEZLA 30 MG Tabs Generic drug:  Apremilast Take 30 mg by mouth daily.  ranitidine 150 MG capsule Commonly known as:  ZANTAC Take 150 mg by mouth as needed. OTC.   sildenafil 100 MG tablet Commonly known as:  VIAGRA Take 1 tablet (100 mg total) by mouth daily as needed for erectile dysfunction.          Objective:   Physical Exam BP 122/84 (BP Location: Left Arm, Patient Position: Sitting, Cuff Size: Normal)   Pulse 72   Temp 98.5 F (36.9 C) (Oral)   Resp 14   Ht  (1.854 m)   Wt 228 lb 4 oz (103.5 kg)   SpO2 97%   BMI 30.11 kg/m   General:   Well developed, well nourished . NAD.  Neck: No  thyromegaly  HEENT:  Normocephalic . Face symmetric, atraumatic Lungs:  CTA B Normal respiratory effort, no intercostal retractions, no accessory muscle use. Heart: RRR,  no murmur.  No pretibial edema bilaterally  Abdomen:  Not distended, soft, non-tender. No rebound or rigidity.   Skin:   Not pale. Not jaundice Neurologic:  alert & oriented X3.  Speech normal, gait appropriate for age and unassisted Strength symmetric and appropriate for age.  Psych: Cognition and judgment appear intact.    Cooperative with normal attention span and concentration.  Behavior appropriate. No anxious or depressed appearing.    Assessment & Plan:   Assessment DM  dx 2012 Mild neuropathy dx 10-2015 HTN GERD Esophageal stricture, dilatation 2003 Gout Psoriasis --- sees dermatology, on  otezla  Eustachian  tube disorder, saw ENT 2016  PLAN: DM: Currently on metformin, check A1c. Needs to improve diet, recommend self learning. See AVS HTN: BP today is very good, he rarely checks BPs at home, one time was 150. Continue losartan, Lopressor and check BPs weekly. Goals discussed. Gout: On allopurinol, no recent attacks, last uric acid satisfactory. Last hemoglobin is slightly low, recheck a CBC, iron and ferritin. Denies nausea, vomiting, blood in the stools. RTC 6 months

## 2016-10-26 NOTE — Patient Instructions (Signed)
GO TO THE LAB : Get the blood work    GO TO THE FRONT DESK Schedule your next appointment for a routine checkup in 6 months   Check the  blood pressure   weekly   Be sure your blood pressure is between 110/65 and  145/85. If it is consistently higher or lower, let me know  Randie Heinz book  to learn about diabetes Diabetes for Dummies   If you need more information about a healthy diet,   Diabetes  visit: The American diabetes Association  Http://www.diabetes.org  The [facility] website it is a great resource http://www.joslin.org

## 2016-11-02 ENCOUNTER — Other Ambulatory Visit: Payer: Self-pay | Admitting: Internal Medicine

## 2016-11-04 ENCOUNTER — Other Ambulatory Visit: Payer: Self-pay | Admitting: Internal Medicine

## 2017-01-20 ENCOUNTER — Other Ambulatory Visit: Payer: Self-pay | Admitting: Internal Medicine

## 2017-01-21 ENCOUNTER — Other Ambulatory Visit: Payer: Self-pay | Admitting: Internal Medicine

## 2017-01-30 ENCOUNTER — Other Ambulatory Visit: Payer: Self-pay | Admitting: Internal Medicine

## 2017-04-17 ENCOUNTER — Other Ambulatory Visit: Payer: Self-pay | Admitting: Internal Medicine

## 2017-05-01 ENCOUNTER — Encounter: Payer: Self-pay | Admitting: Internal Medicine

## 2017-05-01 ENCOUNTER — Ambulatory Visit: Payer: BLUE CROSS/BLUE SHIELD | Admitting: Internal Medicine

## 2017-05-01 VITALS — BP 132/78 | HR 70 | Temp 98.2°F | Resp 14 | Ht 73.0 in | Wt 231.1 lb

## 2017-05-01 DIAGNOSIS — M109 Gout, unspecified: Secondary | ICD-10-CM | POA: Diagnosis not present

## 2017-05-01 DIAGNOSIS — I10 Essential (primary) hypertension: Secondary | ICD-10-CM | POA: Diagnosis not present

## 2017-05-01 DIAGNOSIS — Z23 Encounter for immunization: Secondary | ICD-10-CM

## 2017-05-01 DIAGNOSIS — E119 Type 2 diabetes mellitus without complications: Secondary | ICD-10-CM | POA: Diagnosis not present

## 2017-05-01 LAB — BASIC METABOLIC PANEL
BUN: 12 mg/dL (ref 6–23)
CHLORIDE: 101 meq/L (ref 96–112)
CO2: 28 meq/L (ref 19–32)
Calcium: 9.7 mg/dL (ref 8.4–10.5)
Creatinine, Ser: 0.8 mg/dL (ref 0.40–1.50)
GFR: 106.69 mL/min (ref >60.00)
GLUCOSE: 132 mg/dL — AB (ref 70–99)
POTASSIUM: 4.6 meq/L (ref 3.5–5.1)
SODIUM: 138 meq/L (ref 135–145)

## 2017-05-01 LAB — HEMOGLOBIN A1C: Hgb A1c MFr Bld: 7.2 % — ABNORMAL HIGH (ref 4.6–6.5)

## 2017-05-01 LAB — URIC ACID: Uric Acid, Serum: 5.8 mg/dL (ref 4.0–7.8)

## 2017-05-01 NOTE — Patient Instructions (Signed)
GO TO THE LAB : Get the blood work     GO TO THE FRONT DESK Schedule your next appointment for a  Physical exam in 6 months, fasting     Diabetes and Foot Care Diabetes may cause you to have problems because of poor blood supply (circulation) to your feet and legs. This may cause the skin on your feet to become thinner, break easier, and heal more slowly. Your skin may become dry, and the skin may peel and crack. You may also have nerve damage in your legs and feet causing decreased feeling in them. You may not notice minor injuries to your feet that could lead to infections or more serious problems. Taking care of your feet is one of the most important things you can do for yourself. Follow these instructions at home:  Wear shoes at all times, even in the house. Do not go barefoot. Bare feet are easily injured.  Check your feet daily for blisters, cuts, and redness. If you cannot see the bottom of your feet, use a mirror or ask someone for help.  Wash your feet with warm water (do not use hot water) and mild soap. Then pat your feet and the areas between your toes until they are completely dry. Do not soak your feet as this can dry your skin.  Apply a moisturizing lotion or petroleum jelly (that does not contain alcohol and is unscented) to the skin on your feet and to dry, brittle toenails. Do not apply lotion between your toes.  Trim your toenails straight across. Do not dig under them or around the cuticle. File the edges of your nails with an emery board or nail file.  Do not cut corns or calluses or try to remove them with medicine.  Wear clean socks or stockings every day. Make sure they are not too tight. Do not wear knee-high stockings since they may decrease blood flow to your legs.  Wear shoes that fit properly and have enough cushioning. To break in new shoes, wear them for just a few hours a day. This prevents you from injuring your feet. Always look in your shoes before you  put them on to be sure there are no objects inside.  Do not cross your legs. This may decrease the blood flow to your feet.  If you find a minor scrape, cut, or break in the skin on your feet, keep it and the skin around it clean and dry. These areas may be cleansed with mild soap and water. Do not cleanse the area with peroxide, alcohol, or iodine.  When you remove an adhesive bandage, be sure not to damage the skin around it.  If you have a wound, look at it several times a day to make sure it is healing.  Do not use heating pads or hot water bottles. They may burn your skin. If you have lost feeling in your feet or legs, you may not know it is happening until it is too late.  Make sure your health care provider performs a complete foot exam at least annually or more often if you have foot problems. Report any cuts, sores, or bruises to your health care provider immediately. Contact a health care provider if:  You have an injury that is not healing.  You have cuts or breaks in the skin.  You have an ingrown nail.  You notice redness on your legs or feet.  You feel burning or tingling in your legs  or feet.  You have pain or cramps in your legs and feet.  Your legs or feet are numb.  Your feet always feel cold. Get help right away if:  There is increasing redness, swelling, or pain in or around a wound.  There is a red line that goes up your leg.  Pus is coming from a wound.  You develop a fever or as directed by your health care provider.  You notice a bad smell coming from an ulcer or wound. This information is not intended to replace advice given to you by your health care provider. Make sure you discuss any questions you have with your health care provider. Document Released: 06/10/2000 Document Revised: 11/19/2015 Document Reviewed: 11/20/2012 Elsevier Interactive Patient Education  2017 [employer].

## 2017-05-01 NOTE — Progress Notes (Signed)
Pre visit review using our clinic review tool, if applicable. No additional management support is needed unless otherwise documented below in the visit note. 

## 2017-05-01 NOTE — Assessment & Plan Note (Signed)
DM: Currently on metformin.  Last A1c 6.9, diet and exercise discussed, he however will be willing to add a medication if not at goal. Mild neuropathy: By symptoms, exam negative, feet care discussed HTN: Continue losartan, Lopressor, check a BMP Gout: On allopurinol, no recent episodes, check a uric acid.  Last LFT satisfactory Flu shot today RTC 6 months, CPX

## 2017-05-01 NOTE — Progress Notes (Signed)
Subjective:    Patient ID: Barry Bush, male    DOB: 1960/12/27, 56 y.o.   MRN: [id number]  DOS:  05/01/2017 Type of visit - description : rov Interval history: HTN: Good compliance with medication, no ambulatory BPs DM, good compliance with medicines. Concerned about possible neuropathy, having tingling for the last 3 months in both feet, not worse at night, symptoms are not every day, increase after he rests and start to walk.  No edema. Gout: Good compliance to medication, no recent episodes. Psoriasis: Takes otezla inconsistently   Review of Systems Denies chest pain or difficulty breathing No nausea, vomiting, diarrhea  Past Medical History:  Diagnosis Date  . Acute serous otitis media of left ear 10/2014   Dr. Verne Spurr  . Diabetes mellitus 08-2010  . Esophageal stricture 2003   s/p dilation  . Eustachian tube disorder 10/2014   Dr. Verne Spurr  . GERD (gastroesophageal reflux disease)   . Gout   . Hypertension   . Psoriasis    sees derm  . PSORIASIS 10/16/2006   Qualifier: Diagnosis of  By: Shary Decamp      Past Surgical History:  Procedure Laterality Date  . Facial Trauma      Social History   Socioeconomic History  . Marital status: Married    Spouse name: Not on file  . Number of children: 1  . Years of education: Not on file  . Highest education level: Not on file  Social Needs  . Financial resource strain: Not on file  . Food insecurity - worry: Not on file  . Food insecurity - inability: Not on file  . Transportation needs - medical: Not on file  . Transportation needs - non-medical: Not on file  Occupational History  . 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 Smoking status: Never Smoker  . Smokeless tobacco: Never Used  Substance and Sexual Activity  . Alcohol use: Yes    Comment: 3 days a week  . Drug use: No  . Sexual activity: Not on file  Other Topics Concern  . Not on file  Social  History Narrative  . Not on file      Allergies as of 05/01/2017   No Known Allergies     Medication List        Accurate as of 05/01/17  6:34 PM. Always use your most recent med list.          allopurinol 100 MG tablet Commonly known as:  ZYLOPRIM Take 1 tablet (100 mg total) by mouth 2 (two) times daily.   aspirin 81 MG tablet Take 81 mg by mouth daily.   atorvastatin 10 MG tablet Commonly known as:  LIPITOR Take 1 tablet (10 mg total) by mouth daily.   fluticasone 50 MCG/ACT nasal spray Commonly known as:  FLONASE Place 2 sprays into both nostrils daily.   loratadine 10 MG tablet Commonly known as:  CLARITIN TAKE 1 TABLET BY MOUTH DAILY.   losartan 100 MG tablet Commonly known as:  COZAAR Take 1 tablet (100 mg total) by mouth daily.   metFORMIN 850 MG tablet Commonly known as:  GLUCOPHAGE TAKE 2 TABLETS BY MOUTH WITH BREAKFAST AND 1 TABLET BY MOUTH WITH DINNER.   metoprolol tartrate 100 MG tablet Commonly known as:  LOPRESSOR Take 1 tablet (100 mg total) by mouth daily.   omeprazole 20 MG capsule Commonly known as:  PRILOSEC Take 20 mg by  mouth daily.   OTEZLA 30 MG Tabs Generic drug:  Apremilast Take 30 mg by mouth daily.   ranitidine 150 MG capsule Commonly known as:  ZANTAC Take 150 mg by mouth as needed. OTC.   sildenafil 100 MG tablet Commonly known as:  VIAGRA Take 1 tablet (100 mg total) by mouth daily as needed for erectile dysfunction.          Objective:   Physical Exam BP 132/78 (BP Location: Left Arm, Patient Position: Sitting, Cuff Size: Normal)   Pulse 70   Temp 98.2 F (36.8 C) (Oral)   Resp 14   Ht 6\' 1"  (1.854 m)   Wt 231 lb 2 oz (104.8 kg)   SpO2 97%   BMI 30.49 kg/m  General:   Well developed, well nourished . NAD.  HEENT:  Normocephalic . Face symmetric, atraumatic Lungs:  CTA B Normal respiratory effort, no intercostal retractions, no accessory muscle use. Heart: RRR,  no murmur.  No pretibial edema  bilaterally  Skin: Not pale. Not jaundice DIABETIC FEET EXAM: No lower extremity edema Normal pedal pulses bilaterally Skin normal, nails normal, no calluses Pinprick examination of the feet normal. Neurologic:  alert & oriented X3.  Speech normal, gait appropriate for age and unassisted Psych--  Cognition and judgment appear intact.  Cooperative with normal attention span and concentration.  Behavior appropriate. No anxious or depressed appearing.      Assessment & Plan:   Assessment DM  dx 2012 Mild neuropathy dx 10-2015 HTN GERD Esophageal stricture, dilatation 2003 Gout Psoriasis --- sees dermatology, on  otezla  Eustachian  tube disorder, saw ENT 2016  PLAN: DM: Currently on metformin.  Last A1c 6.9, diet and exercise discussed, he however will be willing to add a medication if not at goal. Mild neuropathy: By symptoms, exam negative, feet care discussed HTN: Continue losartan, Lopressor, check a BMP Gout: On allopurinol, no recent episodes, check a uric acid.  Last LFT satisfactory Flu shot today RTC 6 months, CPX

## 2017-05-10 MED ORDER — PIOGLITAZONE HCL 30 MG PO TABS
30.0000 mg | ORAL_TABLET | Freq: Every day | ORAL | 5 refills | Status: DC
Start: 2017-05-10 — End: 2017-10-31

## 2017-05-10 NOTE — Addendum Note (Signed)
Addended byConrad [place]: Kendra [place] D on: 05/10/2017 08:52 AM   Modules accepted: Orders

## 2017-07-15 ENCOUNTER — Other Ambulatory Visit: Payer: Self-pay | Admitting: Internal Medicine

## 2017-07-17 ENCOUNTER — Other Ambulatory Visit: Payer: Self-pay | Admitting: Internal Medicine

## 2017-07-27 ENCOUNTER — Other Ambulatory Visit: Payer: Self-pay | Admitting: Internal Medicine

## 2017-07-29 ENCOUNTER — Other Ambulatory Visit: Payer: Self-pay | Admitting: Internal Medicine

## 2017-09-13 ENCOUNTER — Ambulatory Visit: Payer: Self-pay | Admitting: *Deleted

## 2017-09-13 NOTE — Telephone Encounter (Signed)
Wife Zella BallRobin called in for pt saying she thinks his feet are swollen one more than the other.   He thought he had an appt this morning with Dr. Drue NovelPaz and went to the office but was informed he did not have an appt this morning.  I double checked and there is no appt this morning with Dr. Drue NovelPaz.  She was not able to answer my triage questions so she is going to have her husband call us back to be triaged.  I gave her the office number.   She will have him call us back.

## 2017-10-01 ENCOUNTER — Telehealth: Payer: Self-pay | Admitting: Internal Medicine

## 2017-10-01 NOTE — Telephone Encounter (Signed)
Needs A1C ast alt, please arrange

## 2017-10-02 NOTE — Telephone Encounter (Signed)
No, please get it this week

## 2017-10-02 NOTE — Telephone Encounter (Signed)
Letter printed and mailed to Pt instructing to call office to schedule lab appt. AST, ALT, A1c already in chart.

## 2017-10-02 NOTE — Telephone Encounter (Signed)
Pt has CPE scheduled for 10/31/2017. Would be okay to perform at that time?

## 2017-10-31 ENCOUNTER — Encounter: Payer: Self-pay | Admitting: Internal Medicine

## 2017-10-31 ENCOUNTER — Ambulatory Visit (INDEPENDENT_AMBULATORY_CARE_PROVIDER_SITE_OTHER): Payer: BLUE CROSS/BLUE SHIELD | Admitting: Internal Medicine

## 2017-10-31 VITALS — BP 126/72 | HR 65 | Temp 98.1°F | Resp 14 | Ht 73.0 in | Wt 230.1 lb

## 2017-10-31 DIAGNOSIS — E0800 Diabetes mellitus due to underlying condition with hyperosmolarity without nonketotic hyperglycemic-hyperosmolar coma (NKHHC): Secondary | ICD-10-CM | POA: Diagnosis not present

## 2017-10-31 DIAGNOSIS — Z0000 Encounter for general adult medical examination without abnormal findings: Secondary | ICD-10-CM | POA: Diagnosis not present

## 2017-10-31 DIAGNOSIS — Z794 Long term (current) use of insulin: Secondary | ICD-10-CM | POA: Diagnosis not present

## 2017-10-31 LAB — CBC WITH DIFFERENTIAL/PLATELET
Basophils Absolute: 0 10*3/uL (ref 0.0–0.1)
Basophils Relative: 0.8 % (ref 0.0–3.0)
EOS PCT: 4.1 % (ref 0.0–5.0)
Eosinophils Absolute: 0.2 10*3/uL (ref 0.0–0.7)
HEMATOCRIT: 38.1 % — AB (ref 39.0–52.0)
Hemoglobin: 12.9 g/dL — ABNORMAL LOW (ref 13.0–17.0)
LYMPHS ABS: 1.4 10*3/uL (ref 0.7–4.0)
Lymphocytes Relative: 24.3 % (ref 12.0–46.0)
MCHC: 33.9 g/dL (ref 30.0–36.0)
MCV: 91.4 fl (ref 78.0–100.0)
Monocytes Absolute: 0.5 10*3/uL (ref 0.1–1.0)
Monocytes Relative: 9.1 % (ref 3.0–12.0)
NEUTROS ABS: 3.6 10*3/uL (ref 1.4–7.7)
Neutrophils Relative %: 61.7 % (ref 43.0–77.0)
PLATELETS: 277 10*3/uL (ref 150.0–400.0)
RBC: 4.17 Mil/uL — ABNORMAL LOW (ref 4.22–5.81)
RDW: 13.1 % (ref 11.5–15.5)
WBC: 5.9 10*3/uL (ref 4.0–10.5)

## 2017-10-31 LAB — COMPREHENSIVE METABOLIC PANEL
ALT: 23 U/L (ref 0–53)
AST: 16 U/L (ref 0–37)
Albumin: 4.2 g/dL (ref 3.5–5.2)
Alkaline Phosphatase: 49 U/L (ref 39–117)
BUN: 14 mg/dL (ref 6–23)
CO2: 27 meq/L (ref 19–32)
Calcium: 9.1 mg/dL (ref 8.4–10.5)
Chloride: 102 mEq/L (ref 96–112)
Creatinine, Ser: 0.82 mg/dL (ref 0.40–1.50)
GFR: 103.5 mL/min (ref >60.00)
GLUCOSE: 141 mg/dL — AB (ref 70–99)
POTASSIUM: 4.2 meq/L (ref 3.5–5.1)
Sodium: 140 mEq/L (ref 135–145)
Total Bilirubin: 0.4 mg/dL (ref 0.2–1.2)
Total Protein: 6.5 g/dL (ref 6.0–8.3)

## 2017-10-31 LAB — LIPID PANEL
CHOL/HDL RATIO: 3
Cholesterol: 138 mg/dL (ref 0–200)
HDL: 41.7 mg/dL (ref >39.00)
LDL Cholesterol: 77 mg/dL (ref 0–99)
NONHDL: 96.51
Triglycerides: 98 mg/dL (ref 0.0–149.0)
VLDL: 19.6 mg/dL (ref 0.0–40.0)

## 2017-10-31 LAB — PSA: PSA: 1.26 ng/mL (ref 0.10–4.00)

## 2017-10-31 LAB — HEMOGLOBIN A1C: Hgb A1c MFr Bld: 7.1 % — ABNORMAL HIGH (ref 4.6–6.5)

## 2017-10-31 NOTE — Assessment & Plan Note (Addendum)
--  Td 2015;  PNM 2015;  Prevnar: 2016; shingrix not available  -- CCS:  cscope 04-2013, pt told 10 years.  We will try -again- to get records --Prostate cancer screening: DRE normal today, check a PSA --Diet and exercise: limited time, unable to do much, taking care of his wife and his mother.  Counseled. --Labs: CMP, FLP, CBC, A1c, PSA

## 2017-10-31 NOTE — Progress Notes (Signed)
Subjective:    Patient ID: Barry Bush, male    DOB: [birth year], 57 y.o.   MRN: [id number]  DOS:  10/31/2017 Type of visit - description : cpx Interval history: In general feeling well.   Review of Systems Continue with paresthesias: tingling from the ankles down, worse on the left, day or night, occasionally the area feels burning.   Other than above, a 14 point review of systems is negative    Past Medical History:  Diagnosis Date  . Acute serous otitis media of left ear 10/2014   Dr. Verne Spurr  . Diabetes mellitus 08-2010  . Esophageal stricture 2003   s/p dilation  . Eustachian tube disorder 10/2014   Dr. Verne Spurr  . GERD (gastroesophageal reflux disease)   . Gout   . Hypertension   . Psoriasis    sees derm  . PSORIASIS 10/16/2006   Qualifier: Diagnosis of  By: Shary Decamp      Past Surgical History:  Procedure Laterality Date  . Facial Trauma      Social History   Socioeconomic History  . Marital status: Married    Spouse name: Not on file  . Number of children: 1  . Years of education: Not on file  . Highest education level: Not on file  Occupational History  . 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 Financial resource strain: Not on file  . Food insecurity:    Worry: Not on file    Inability: Not on file  . Transportation needs:    Medical: Not on file    Non-medical: Not on file  Tobacco Use  . Smoking status: Never Smoker  . Smokeless tobacco: Never Used  Substance and Sexual Activity  . Alcohol use: Yes    Comment: 3 days a week  . Drug use: No  . Sexual activity: Not on file  Lifestyle  . Physical activity:    Days per week: Not on file    Minutes per session: Not on file  . Stress: Not on file  Relationships  . Social connections:    Talks on phone: Not on file    Gets together: Not on file    Attends religious service: Not on file    Active member of club or organization: Not on  file    Attends meetings of clubs or organizations: Not on file    Relationship status: Not on file  . Intimate partner violence:    Fear of current or ex partner: Not on file    Emotionally abused: Not on file    Physically abused: Not on file    Forced sexual activity: Not on file  Other Topics Concern  . Not on file  Social History Narrative   Household- pt and wife     Family History  Problem Relation Age of Onset  . Diabetes Father   . Heart attack Father        2 stents 2012, onset at age 11  . Diabetes Mother   . Lung cancer Unknown        GM  . Melanoma Other        GF  . Colon cancer Neg Hx   . Prostate cancer Neg Hx      Allergies as of 10/31/2017   No Known Allergies     Medication List        Accurate as of 10/31/17  5:56 PM.  Always use your most recent med list.          allopurinol 100 MG tablet Commonly known as:  ZYLOPRIM Take 1 tablet (100 mg total) by mouth 2 (two) times daily.   aspirin 81 MG tablet Take 81 mg by mouth daily.   atorvastatin 10 MG tablet Commonly known as:  LIPITOR Take 1 tablet (10 mg total) by mouth daily.   fluticasone 50 MCG/ACT nasal spray Commonly known as:  FLONASE Place 2 sprays into both nostrils daily.   loratadine 10 MG tablet Commonly known as:  CLARITIN TAKE 1 TABLET BY MOUTH DAILY.   losartan 100 MG tablet Commonly known as:  COZAAR Take 1 tablet (100 mg total) by mouth daily.   metFORMIN 850 MG tablet Commonly known as:  GLUCOPHAGE TAKE 2 TABLETS BY MOUTH WITH BREAKFAST AND 1 TABLET BY MOUTH WITH DINNER.   metoprolol tartrate 100 MG tablet Commonly known as:  LOPRESSOR Take 1 tablet (100 mg total) by mouth daily.   omeprazole 20 MG capsule Commonly known as:  PRILOSEC Take 20 mg by mouth daily.   OTEZLA 30 MG Tabs Generic drug:  Apremilast Take 30 mg by mouth daily.   sildenafil 100 MG tablet Commonly known as:  VIAGRA Take 1 tablet (100 mg total) by mouth daily as needed for erectile  dysfunction.          Objective:   Physical Exam BP 126/72 (BP Location: Left Arm, Patient Position: Sitting, Cuff Size: Normal)   Pulse 65   Temp 98.1 F (36.7 C) (Oral)   Resp 14   Ht  (1.854 m)   Wt 230 lb 2 oz (104.4 kg)   SpO2 97%   BMI 30.36 kg/m  General:   Well developed, well nourished . NAD.  Neck: No  thyromegaly  HEENT:  Normocephalic . Face symmetric, atraumatic Lungs:  CTA B Normal respiratory effort, no intercostal retractions, no accessory muscle use. Heart: RRR,  no murmur.  No pretibial edema bilaterally  Abdomen:  Not distended, soft, non-tender. No rebound or rigidity.   Rectal: External abnormalities: none. Normal sphincter tone. No rectal masses or tenderness.  Brown stools Prostate: Prostate gland firm and smooth, no enlargement, nodularity, tenderness, mass, asymmetry or induration DIABETIC FEET EXAM: No lower extremity edema Skin normal, nails normal, no calluses Pinprick examination of the feet: Patchy, mild, distal decrease sensitivity  Skin: Exposed areas without rash. Not pale. Not jaundice Neurologic:  alert & oriented X3.  Speech normal, gait appropriate for age and unassisted Strength symmetric and appropriate for age.  Psych: Cognition and judgment appear intact.  Cooperative with normal attention span and concentration.  Behavior appropriate. No anxious or depressed appearing.     Assessment & Plan:   Assessment DM  dx 2012 Mild neuropathy dx 10-2015 HTN GERD Esophageal stricture, dilatation 2003 Gout Psoriasis --- sees dermatology, on  otezla  Eustachian  tube disorder, saw ENT 2016  PLAN: DM: Currently on metformin, last A1c elevated, I Rx Actos but he never got the message.  Plan: Check a A1c, consider adding another medication, he will be okay with nonbranded medicines, he also be okay with injectables if needed. Neuropathy: Feet care discussed HTN: Amb BPs in the 120s   Continue losartan and  metoprolol. Gout: Asymptomatic RTC 4 months

## 2017-10-31 NOTE — Patient Instructions (Addendum)
GO TO THE LAB : Get the blood work     GO TO THE FRONT DESK Schedule your next appointment for a   Check up in 4 months    See your eye doctor   DIABETES self learn tools:  Get the book "Diabetes for Dummies" Get the book: "The [facility] Diabetes diet"   Online resources: The American diabetes Association     diabetes.org  Visit [facility] website it is a [employer]  joslin.org  The [facility] web site has a diabetes section  PinkCheek.be     Diabetes and Foot Care Diabetes may cause you to have problems because of poor blood supply (circulation) to your feet and legs. This may cause the skin on your feet to become thinner, break easier, and heal more slowly. Your skin may become dry, and the skin may peel and crack. You may also have nerve damage in your legs and feet causing decreased feeling in them. You may not notice minor injuries to your feet that could lead to infections or more serious problems. Taking care of your feet is one of the most important things you can do for yourself. Follow these instructions at home:  Wear shoes at all times, even in the house. Do not go barefoot. Bare feet are easily injured.  Check your feet daily for blisters, cuts, and redness. If you cannot see the bottom of your feet, use a mirror or ask someone for help.  Wash your feet with warm water (do not use hot water) and mild soap. Then pat your feet and the areas between your toes until they are completely dry. Do not soak your feet as this can dry your skin.  Apply a moisturizing lotion or petroleum jelly (that does not contain alcohol and is unscented) to the skin on your feet and to dry, brittle toenails. Do not apply lotion between your toes.  Trim your toenails straight across. Do not dig under them or around the cuticle. File the edges of your nails with an emery board or nail file.  Do not cut corns or calluses or try to remove them with medicine.  Wear clean socks or  stockings every day. Make sure they are not too tight. Do not wear knee-high stockings since they may decrease blood flow to your legs.  Wear shoes that fit properly and have enough cushioning. To break in new shoes, wear them for just a few hours a day. This prevents you from injuring your feet. Always look in your shoes before you put them on to be sure there are no objects inside.  Do not cross your legs. This may decrease the blood flow to your feet.  If you find a minor scrape, cut, or break in the skin on your feet, keep it and the skin around it clean and dry. These areas may be cleansed with mild soap and water. Do not cleanse the area with peroxide, alcohol, or iodine.  When you remove an adhesive bandage, be sure not to damage the skin around it.  If you have a wound, look at it several times a day to make sure it is healing.  Do not use heating pads or hot water bottles. They may burn your skin. If you have lost feeling in your feet or legs, you may not know it is happening until it is too late.  Make sure your health care provider performs a complete foot exam at least annually or more often  if you have foot problems. Report any cuts, sores, or bruises to your health care provider immediately. Contact a health care provider if:  You have an injury that is not healing.  You have cuts or breaks in the skin.  You have an ingrown nail.  You notice redness on your legs or feet.  You feel burning or tingling in your legs or feet.  You have pain or cramps in your legs and feet.  Your legs or feet are numb.  Your feet always feel cold. Get help right away if:  There is increasing redness, swelling, or pain in or around a wound.  There is a red line that goes up your leg.  Pus is coming from a wound.  You develop a fever or as directed by your health care provider.  You notice a bad smell coming from an ulcer or wound. This information is not intended to replace advice  given to you by your health care provider. Make sure you discuss any questions you have with your health care provider. Document Released: 06/10/2000 Document Revised: 11/19/2015 Document Reviewed: 11/20/2012 Elsevier Interactive Patient Education  2017 [employer].

## 2017-10-31 NOTE — Assessment & Plan Note (Signed)
DM: Currently on metformin, last A1c elevated, I Rx Actos but he never got the message.  Plan: Check a A1c, consider adding another medication, he will be okay with nonbranded medicines, he also be okay with injectables if needed. Neuropathy: Feet care discussed HTN: Amb BPs in the 120s   Continue losartan and metoprolol. Gout: Asymptomatic RTC 4 months

## 2017-10-31 NOTE — Progress Notes (Signed)
Pre visit review using our clinic review tool, if applicable. No additional management support is needed unless otherwise documented below in the visit note. 

## 2017-11-08 MED ORDER — PIOGLITAZONE HCL 30 MG PO TABS: 30.0000 mg | ORAL_TABLET | Freq: Every day | ORAL | 6 refills | Status: DC

## 2017-11-08 NOTE — Addendum Note (Signed)
Addended byConrad [place] D on: 11/08/2017 10:52 AM   Modules accepted: Orders

## 2017-12-11 ENCOUNTER — Encounter: Payer: Self-pay | Admitting: Internal Medicine

## 2018-01-08 ENCOUNTER — Other Ambulatory Visit: Payer: Self-pay | Admitting: Internal Medicine

## 2018-01-22 ENCOUNTER — Other Ambulatory Visit: Payer: Self-pay | Admitting: Internal Medicine

## 2018-02-14 ENCOUNTER — Ambulatory Visit: Payer: Self-pay | Admitting: Podiatry

## 2018-03-05 ENCOUNTER — Encounter: Payer: Self-pay | Admitting: Internal Medicine

## 2018-03-05 ENCOUNTER — Ambulatory Visit: Payer: BLUE CROSS/BLUE SHIELD | Admitting: Internal Medicine

## 2018-03-05 VITALS — BP 124/80 | HR 61 | Temp 97.9°F | Resp 14 | Ht 73.0 in | Wt 232.5 lb

## 2018-03-05 DIAGNOSIS — E1140 Type 2 diabetes mellitus with diabetic neuropathy, unspecified: Secondary | ICD-10-CM

## 2018-03-05 DIAGNOSIS — R319 Hematuria, unspecified: Secondary | ICD-10-CM

## 2018-03-05 DIAGNOSIS — R809 Proteinuria, unspecified: Secondary | ICD-10-CM | POA: Diagnosis not present

## 2018-03-05 DIAGNOSIS — I10 Essential (primary) hypertension: Secondary | ICD-10-CM | POA: Diagnosis not present

## 2018-03-05 DIAGNOSIS — E119 Type 2 diabetes mellitus without complications: Secondary | ICD-10-CM

## 2018-03-05 DIAGNOSIS — G629 Polyneuropathy, unspecified: Secondary | ICD-10-CM

## 2018-03-05 DIAGNOSIS — D519 Vitamin B12 deficiency anemia, unspecified: Secondary | ICD-10-CM

## 2018-03-05 LAB — TSH: TSH: 2.07 u[IU]/mL (ref 0.35–4.50)

## 2018-03-05 LAB — URINALYSIS, ROUTINE W REFLEX MICROSCOPIC
Bilirubin Urine: NEGATIVE
Hgb urine dipstick: NEGATIVE
KETONES UR: NEGATIVE
Leukocytes, UA: NEGATIVE
Nitrite: NEGATIVE
RBC / HPF: NONE SEEN (ref 0–?)
SPECIFIC GRAVITY, URINE: 1.02 (ref 1.000–1.030)
Urine Glucose: NEGATIVE
Urobilinogen, UA: 0.2 (ref 0.0–1.0)
pH: 6 (ref 5.0–8.0)

## 2018-03-05 LAB — BASIC METABOLIC PANEL
BUN: 16 mg/dL (ref 6–23)
CHLORIDE: 100 meq/L (ref 96–112)
CO2: 29 meq/L (ref 19–32)
Calcium: 9.6 mg/dL (ref 8.4–10.5)
Creatinine, Ser: 0.87 mg/dL (ref 0.40–1.50)
GFR: 96.54 mL/min (ref >60.00)
Glucose, Bld: 135 mg/dL — ABNORMAL HIGH (ref 70–99)
Potassium: 4.8 mEq/L (ref 3.5–5.1)
SODIUM: 137 meq/L (ref 135–145)

## 2018-03-05 LAB — B12 AND FOLATE PANEL
FOLATE: 16.6 ng/mL (ref >5.9)
VITAMIN B 12: 144 pg/mL — AB (ref 211–911)

## 2018-03-05 LAB — HEMOGLOBIN A1C: HEMOGLOBIN A1C: 7.2 % — AB (ref 4.6–6.5)

## 2018-03-05 MED ORDER — GABAPENTIN 300 MG PO CAPS: 300.0000 mg | ORAL_CAPSULE | Freq: Every day | ORAL | 6 refills | Status: DC

## 2018-03-05 NOTE — Patient Instructions (Signed)
GO TO THE LAB : Get the blood work     GO TO THE FRONT DESK Schedule your next appointment for a  Check up in 4 months   Start taking gabapentin 300 mg: 1 tablet at bedtime, will make you slightly sleepy.  Will help with neuropathy.  If is not helping please let me know

## 2018-03-05 NOTE — Progress Notes (Signed)
Pre visit review using our clinic review tool, if applicable. No additional management support is needed unless otherwise documented below in the visit note. 

## 2018-03-05 NOTE — Progress Notes (Signed)
Subjective:    Patient ID: Barry Bush, male    DOB: 1960-07-24, 57 y.o.   MRN: [id number]  DOS:  03/05/2018 Type of visit - description : rov Interval history: DM: Since the last visit, Actos was added.  Good compliance, no apparent side effects Back in July, he was working outside, got dehydrated and got a headache.  Went to urgent care, no blood work done, urinalysis show traces of "blood". Since then he is doing well HTN: Good compliance with medication. Neuropathy slightly worse lately.  He does have diabetic shoes and check his feet regularly. Psoriasis: Intolerant to [employer].   Review of Systems Denies chest pain or difficulty breathing No nausea, vomiting, diarrhea. No dysuria or gross hematuria  Past Medical History:  Diagnosis Date  . Acute serous otitis media of left ear 10/2014   Dr. Verne Spurr  . Diabetes mellitus 08-2010  . Esophageal stricture 2003   s/p dilation  . Eustachian tube disorder 10/2014   Dr. Verne Spurr  . GERD (gastroesophageal reflux disease)   . Gout   . Hypertension   . Psoriasis    sees derm  . PSORIASIS 10/16/2006   Qualifier: Diagnosis of  By: Shary Decamp      Past Surgical History:  Procedure Laterality Date  . Facial Trauma      Social History   Socioeconomic History  . Marital status: Married    Spouse name: Not on file  . Number of children: 1  . Years of education: Not on file  . Highest education level: Not on file  Occupational History  . 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 Financial resource strain: Not on file  . Food insecurity:    Worry: Not on file    Inability: Not on file  . Transportation needs:    Medical: Not on file    Non-medical: Not on file  Tobacco Use  . Smoking status: Never Smoker  . Smokeless tobacco: Never Used  Substance and Sexual Activity  . Alcohol use: Yes    Comment: 3 days a week  . Drug use: No  . Sexual activity: Not on file    Lifestyle  . Physical activity:    Days per week: Not on file    Minutes per session: Not on file  . Stress: Not on file  Relationships  . Social connections:    Talks on phone: Not on file    Gets together: Not on file    Attends religious service: Not on file    Active member of club or organization: Not on file    Attends meetings of clubs or organizations: Not on file    Relationship status: Not on file  . Intimate partner violence:    Fear of current or ex partner: Not on file    Emotionally abused: Not on file    Physically abused: Not on file    Forced sexual activity: Not on file  Other Topics Concern  . Not on file  Social History Narrative   Household- pt and wife      Allergies as of 03/05/2018   No Known Allergies     Medication List        Accurate as of 03/05/18  8:51 AM. Always use your most recent med list.          allopurinol 100 MG tablet Commonly known as:  ZYLOPRIM Take 1 tablet (100 mg  total) by mouth 2 (two) times daily.   aspirin 81 MG tablet Take 81 mg by mouth daily.   atorvastatin 10 MG tablet Commonly known as:  LIPITOR Take 1 tablet (10 mg total) by mouth daily.   fluticasone 50 MCG/ACT nasal spray Commonly known as:  FLONASE Place 2 sprays into both nostrils daily.   loratadine 10 MG tablet Commonly known as:  CLARITIN TAKE 1 TABLET BY MOUTH DAILY.   losartan 100 MG tablet Commonly known as:  COZAAR Take 1 tablet (100 mg total) by mouth daily.   metFORMIN 850 MG tablet Commonly known as:  GLUCOPHAGE TAKE 2 TABLETS BY MOUTH WITH BREAKFAST AND 1 TABLET BY MOUTH WITH DINNER.   metoprolol tartrate 100 MG tablet Commonly known as:  LOPRESSOR Take 1 tablet (100 mg total) by mouth daily.   omeprazole 20 MG capsule Commonly known as:  PRILOSEC Take 20 mg by mouth daily.   OTEZLA 30 MG Tabs Generic drug:  Apremilast Take 30 mg by mouth daily.   pioglitazone 30 MG tablet Commonly known as:  ACTOS Take 1 tablet (30 mg  total) by mouth daily.   sildenafil 100 MG tablet Commonly known as:  VIAGRA Take 1 tablet (100 mg total) by mouth daily as needed for erectile dysfunction.          Objective:   Physical Exam BP 124/80 (BP Location: Left Arm, Patient Position: Sitting, Cuff Size: Normal)   Pulse 61   Temp 97.9 F (36.6 C) (Oral)   Resp 14   Ht 6\' 1"  (1.854 m)   Wt 232 lb 8 oz (105.5 kg)   SpO2 97%   BMI 30.67 kg/m  General:   Well developed, NAD, see BMI.  HEENT:  Normocephalic . Face symmetric, atraumatic Lungs:  CTA B Normal respiratory effort, no intercostal retractions, no accessory muscle use. Heart: RRR,  no murmur.  No pretibial edema bilaterally  Skin: Not pale. Not jaundice Neurologic:  alert & oriented X3.  Speech normal, gait appropriate for age and unassisted Psych--  Cognition and judgment appear intact.  Cooperative with normal attention span and concentration.  Behavior appropriate. No anxious or depressed appearing.      Assessment & Plan:   Assessment DM  dx 2012 Mild neuropathy dx 10-2015 HTN GERD Esophageal stricture, dilatation 2003 Gout Psoriasis --- sees dermatology, on  otezla  Eustachian  tube disorder, saw ENT 2016  PLAN: DM: Last A1c 7.1, he was on metformin.  Actos was added.  Check A1c. Has an eye check next week. Neuropathy: Slightly worse lately, has numbness, tingling and burning at the lower extremities worse at night.  He does use diabetic shoes and check his feet regularly. Plan: Start gabapentin 300 mg nightly, drowsiness discussed, increase dose as needed. For completeness we will get a TSH, B1, B12, folic acid and vitamin D. HTN: Seems well controlled on losartan, metoprolol.  Recently was diagnosed with dehydration elsewhere, strongly recommend to avoid dehydration in the future (on losartan).  Checking a BMP. Microscopic hematuria: See HPI, found at a urgent care, he is asymptomatic, check a UA and urine culture. RTC 4 months

## 2018-03-05 NOTE — Assessment & Plan Note (Signed)
DM: Last A1c 7.1, he was on metformin.  Actos was added.  Check A1c. Has an eye check next week. Neuropathy: Slightly worse lately, has numbness, tingling and burning at the lower extremities worse at night.  He does use diabetic shoes and check his feet regularly. Plan: Start gabapentin 300 mg nightly, drowsiness discussed, increase dose as needed. For completeness we will get a TSH, B1, B12, folic acid and vitamin D. HTN: Seems well controlled on losartan, metoprolol.  Recently was diagnosed with dehydration elsewhere, strongly recommend to avoid dehydration in the future (on losartan).  Checking a BMP. Microscopic hematuria: See HPI, found at a urgent care, he is asymptomatic, check a UA and urine culture. RTC 4 months

## 2018-03-07 DIAGNOSIS — E119 Type 2 diabetes mellitus without complications: Secondary | ICD-10-CM | POA: Diagnosis not present

## 2018-03-07 LAB — HM DIABETES EYE EXAM

## 2018-03-10 LAB — URINE CULTURE
MICRO NUMBER:: 91075023
Result:: NO GROWTH
SPECIMEN QUALITY: ADEQUATE

## 2018-03-10 LAB — VITAMIN D 1,25 DIHYDROXY
Vitamin D 1, 25 (OH)2 Total: 21 pg/mL (ref 18–72)
Vitamin D2 1, 25 (OH)2: <8 pg/mL
Vitamin D3 1, 25 (OH)2: 21 pg/mL

## 2018-03-10 LAB — VITAMIN B1: Vitamin B1 (Thiamine): 9 nmol/L (ref 8–30)

## 2018-03-14 NOTE — Addendum Note (Signed)
Addended byConrad Machias: Devani Odonnel D on: 03/14/2018 09:31 AM   Modules accepted: Orders

## 2018-03-19 ENCOUNTER — Encounter: Payer: Self-pay | Admitting: Internal Medicine

## 2018-04-06 ENCOUNTER — Ambulatory Visit: Payer: Self-pay | Admitting: Podiatry

## 2018-04-16 ENCOUNTER — Ambulatory Visit: Payer: Self-pay | Admitting: *Deleted

## 2018-04-16 NOTE — Telephone Encounter (Signed)
Ok, thx

## 2018-04-16 NOTE — Telephone Encounter (Signed)
FYI. Pt headed to ED.  
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
## 2018-06-26 ENCOUNTER — Other Ambulatory Visit: Payer: Self-pay | Admitting: Internal Medicine

## 2018-07-02 ENCOUNTER — Other Ambulatory Visit: Payer: Self-pay | Admitting: Internal Medicine

## 2018-07-05 ENCOUNTER — Ambulatory Visit: Payer: BLUE CROSS/BLUE SHIELD | Admitting: Internal Medicine

## 2018-07-19 ENCOUNTER — Other Ambulatory Visit: Payer: Self-pay | Admitting: Internal Medicine

## 2018-08-17 ENCOUNTER — Other Ambulatory Visit: Payer: Self-pay | Admitting: Internal Medicine

## 2018-08-21 ENCOUNTER — Other Ambulatory Visit: Payer: Self-pay | Admitting: Internal Medicine

## 2018-08-21 NOTE — Telephone Encounter (Signed)
Patient has an appt for 3/4. Can this losartan be refilled?

## 2018-08-29 ENCOUNTER — Encounter: Payer: Self-pay | Admitting: Internal Medicine

## 2018-08-29 ENCOUNTER — Ambulatory Visit: Payer: BLUE CROSS/BLUE SHIELD | Admitting: Internal Medicine

## 2018-08-29 VITALS — Ht 73.0 in

## 2018-08-29 DIAGNOSIS — Z23 Encounter for immunization: Secondary | ICD-10-CM

## 2018-08-30 ENCOUNTER — Other Ambulatory Visit: Payer: Self-pay | Admitting: Internal Medicine

## 2018-09-04 ENCOUNTER — Other Ambulatory Visit: Payer: Self-pay | Admitting: Internal Medicine

## 2018-09-04 DIAGNOSIS — L400 Psoriasis vulgaris: Secondary | ICD-10-CM | POA: Diagnosis not present

## 2018-09-04 DIAGNOSIS — Z79899 Other long term (current) drug therapy: Secondary | ICD-10-CM | POA: Diagnosis not present

## 2018-09-06 ENCOUNTER — Other Ambulatory Visit: Payer: Self-pay

## 2018-09-06 ENCOUNTER — Ambulatory Visit: Payer: BLUE CROSS/BLUE SHIELD | Admitting: Internal Medicine

## 2018-09-10 ENCOUNTER — Other Ambulatory Visit: Payer: Self-pay

## 2018-09-10 MED ORDER — METFORMIN HCL 850 MG PO TABS: ORAL_TABLET | ORAL | 0 refills | Status: DC

## 2018-09-10 MED ORDER — METOPROLOL TARTRATE 100 MG PO TABS: 100.0000 mg | ORAL_TABLET | Freq: Every day | ORAL | 0 refills | Status: DC

## 2018-09-10 MED ORDER — ATORVASTATIN CALCIUM 10 MG PO TABS: 10.0000 mg | ORAL_TABLET | Freq: Every day | ORAL | 0 refills | Status: DC

## 2018-09-10 MED ORDER — LOSARTAN POTASSIUM 100 MG PO TABS: 100.0000 mg | ORAL_TABLET | Freq: Every day | ORAL | 0 refills | Status: DC

## 2018-09-10 MED ORDER — GABAPENTIN 300 MG PO CAPS: 300.0000 mg | ORAL_CAPSULE | Freq: Every day | ORAL | 0 refills | Status: DC

## 2018-09-10 MED ORDER — PIOGLITAZONE HCL 30 MG PO TABS: 30.0000 mg | ORAL_TABLET | Freq: Every day | ORAL | 0 refills | Status: DC

## 2018-09-11 ENCOUNTER — Ambulatory Visit: Payer: BLUE CROSS/BLUE SHIELD | Admitting: Internal Medicine

## 2018-10-01 ENCOUNTER — Other Ambulatory Visit: Payer: Self-pay | Admitting: Internal Medicine

## 2018-12-08 ENCOUNTER — Other Ambulatory Visit: Payer: Self-pay | Admitting: Internal Medicine

## 2018-12-27 ENCOUNTER — Other Ambulatory Visit: Payer: Self-pay | Admitting: Internal Medicine

## 2019-01-07 ENCOUNTER — Other Ambulatory Visit: Payer: Self-pay

## 2019-01-07 ENCOUNTER — Encounter: Payer: Self-pay | Admitting: Internal Medicine

## 2019-01-07 ENCOUNTER — Ambulatory Visit: Payer: BC Managed Care – PPO | Admitting: Internal Medicine

## 2019-01-07 VITALS — BP 174/90 | HR 58 | Temp 98.0°F | Resp 16 | Ht 73.0 in | Wt 236.5 lb

## 2019-01-07 DIAGNOSIS — E785 Hyperlipidemia, unspecified: Secondary | ICD-10-CM

## 2019-01-07 DIAGNOSIS — R3129 Other microscopic hematuria: Secondary | ICD-10-CM | POA: Diagnosis not present

## 2019-01-07 DIAGNOSIS — I10 Essential (primary) hypertension: Secondary | ICD-10-CM

## 2019-01-07 DIAGNOSIS — E1140 Type 2 diabetes mellitus with diabetic neuropathy, unspecified: Secondary | ICD-10-CM

## 2019-01-07 DIAGNOSIS — E538 Deficiency of other specified B group vitamins: Secondary | ICD-10-CM | POA: Insufficient documentation

## 2019-01-07 LAB — CBC WITH DIFFERENTIAL/PLATELET
Basophils Absolute: 0.1 10*3/uL (ref 0.0–0.1)
Basophils Relative: 0.6 % (ref 0.0–3.0)
Eosinophils Absolute: 0.3 10*3/uL (ref 0.0–0.7)
Eosinophils Relative: 3.5 % (ref 0.0–5.0)
HCT: 38.9 % — ABNORMAL LOW (ref 39.0–52.0)
Hemoglobin: 12.9 g/dL — ABNORMAL LOW (ref 13.0–17.0)
Lymphocytes Relative: 24.7 % (ref 12.0–46.0)
Lymphs Abs: 2.2 10*3/uL (ref 0.7–4.0)
MCHC: 33.2 g/dL (ref 30.0–36.0)
MCV: 93.5 fl (ref 78.0–100.0)
Monocytes Absolute: 0.7 10*3/uL (ref 0.1–1.0)
Monocytes Relative: 7.9 % (ref 3.0–12.0)
Neutro Abs: 5.6 10*3/uL (ref 1.4–7.7)
Neutrophils Relative %: 63.3 % (ref 43.0–77.0)
Platelets: 262 10*3/uL (ref 150.0–400.0)
RBC: 4.16 Mil/uL — ABNORMAL LOW (ref 4.22–5.81)
RDW: 13.3 % (ref 11.5–15.5)
WBC: 8.9 10*3/uL (ref 4.0–10.5)

## 2019-01-07 LAB — COMPREHENSIVE METABOLIC PANEL
ALT: 13 U/L (ref 0–53)
AST: 11 U/L (ref 0–37)
Albumin: 4.5 g/dL (ref 3.5–5.2)
Alkaline Phosphatase: 50 U/L (ref 39–117)
BUN: 14 mg/dL (ref 6–23)
CO2: 28 mEq/L (ref 19–32)
Calcium: 9.4 mg/dL (ref 8.4–10.5)
Chloride: 101 mEq/L (ref 96–112)
Creatinine, Ser: 0.82 mg/dL (ref 0.40–1.50)
GFR: 96.96 mL/min (ref >60.00)
Glucose, Bld: 83 mg/dL (ref 70–99)
Potassium: 5 mEq/L (ref 3.5–5.1)
Sodium: 139 mEq/L (ref 135–145)
Total Bilirubin: 0.4 mg/dL (ref 0.2–1.2)
Total Protein: 6.7 g/dL (ref 6.0–8.3)

## 2019-01-07 LAB — URINALYSIS, ROUTINE W REFLEX MICROSCOPIC
Bilirubin Urine: NEGATIVE
Hgb urine dipstick: NEGATIVE
Ketones, ur: NEGATIVE
Leukocytes,Ua: NEGATIVE
Nitrite: NEGATIVE
RBC / HPF: NONE SEEN (ref 0–?)
Specific Gravity, Urine: 1.015 (ref 1.000–1.030)
Urine Glucose: NEGATIVE
Urobilinogen, UA: 0.2 (ref 0.0–1.0)
pH: 6 (ref 5.0–8.0)

## 2019-01-07 LAB — HEMOGLOBIN A1C: Hgb A1c MFr Bld: 6.8 % — ABNORMAL HIGH (ref 4.6–6.5)

## 2019-01-07 LAB — LIPID PANEL
Cholesterol: 151 mg/dL (ref 0–200)
HDL: 54.1 mg/dL (ref >39.00)
LDL Cholesterol: 74 mg/dL (ref 0–99)
NonHDL: 97.19
Total CHOL/HDL Ratio: 3
Triglycerides: 115 mg/dL (ref 0.0–149.0)
VLDL: 23 mg/dL (ref 0.0–40.0)

## 2019-01-07 MED ORDER — METOPROLOL TARTRATE 100 MG PO TABS: 100.0000 mg | ORAL_TABLET | Freq: Every day | ORAL | 1 refills | Status: DC

## 2019-01-07 MED ORDER — ALLOPURINOL 100 MG PO TABS: 100.0000 mg | ORAL_TABLET | Freq: Every day | ORAL | 1 refills | Status: DC

## 2019-01-07 MED ORDER — METFORMIN HCL 850 MG PO TABS: ORAL_TABLET | ORAL | 1 refills | Status: DC

## 2019-01-07 MED ORDER — PIOGLITAZONE HCL 30 MG PO TABS: 30.0000 mg | ORAL_TABLET | Freq: Every day | ORAL | 1 refills | Status: DC

## 2019-01-07 MED ORDER — GABAPENTIN 300 MG PO CAPS: 300.0000 mg | ORAL_CAPSULE | Freq: Every day | ORAL | 1 refills | Status: DC

## 2019-01-07 MED ORDER — LOSARTAN POTASSIUM 100 MG PO TABS: 100.0000 mg | ORAL_TABLET | Freq: Every day | ORAL | 1 refills | Status: DC

## 2019-01-07 MED ORDER — ATORVASTATIN CALCIUM 10 MG PO TABS: 10.0000 mg | ORAL_TABLET | Freq: Every day | ORAL | 1 refills | Status: DC

## 2019-01-07 NOTE — Progress Notes (Signed)
Pre visit review using our clinic review tool, if applicable. No additional management support is needed unless otherwise documented below in the visit note. 

## 2019-01-07 NOTE — Assessment & Plan Note (Signed)
DM: Last A1c 7.2.  Currently on metformin, Actos.  Diet is regular due to his job is hard for him to improve it.  Will check A1c, FLP. Neuropathy: Likely from diabetes, improve with gabapentin.  No change.  See last visit, blood work showed  low B12. B12 deficiency: DX 02-2018, has not restarted supplements, recommend to do p.o.: 1000 mg daily HTN: Used to be controlled but not anymore.  2 weeks ago BP was 190/100, last week 165/100.  Good compliance with losartan and metoprolol.  Unclear why is going up,  diet related?[name]  Unable to eat healthy.  Will check CMP, further advised with results.  Low-salt diet encouraged. Gout: Self decrease allopurinol to 1 tablet daily. No sx Microscopic hematuria: Recheck a UA urine culture COVID-19: He is high risk, recommend follow strict precautions. RTC 3 months

## 2019-01-07 NOTE — Patient Instructions (Addendum)
GO TO THE LAB : Get the blood work     GO TO THE FRONT DESK Schedule your next appointment for a physical exam in 3 months   Check the  blood pressure 2 or 3 times a week Be sure your blood pressure is between 110/65 and  135/85. If it is consistently higher or lower, let me know  We will add a medication for blood pressure once we get your blood work done.  Start taking a B12 supplement at least 1000 mg daily
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
## 2019-01-08 LAB — URINE CULTURE
MICRO NUMBER:: 660432
Result:: NO GROWTH
SPECIMEN QUALITY:: ADEQUATE

## 2019-01-10 ENCOUNTER — Other Ambulatory Visit: Payer: Self-pay | Admitting: Internal Medicine

## 2019-01-10 MED ORDER — AMLODIPINE BESYLATE 5 MG PO TABS: 5.0000 mg | ORAL_TABLET | Freq: Every day | ORAL | 6 refills | Status: DC

## 2019-04-11 ENCOUNTER — Encounter: Payer: Self-pay | Admitting: Internal Medicine

## 2019-04-11 ENCOUNTER — Other Ambulatory Visit: Payer: Self-pay

## 2019-04-11 ENCOUNTER — Ambulatory Visit (INDEPENDENT_AMBULATORY_CARE_PROVIDER_SITE_OTHER): Payer: BC Managed Care – PPO | Admitting: Internal Medicine

## 2019-04-11 VITALS — BP 179/94 | HR 62 | Temp 96.0°F | Resp 16 | Ht 73.0 in | Wt 240.5 lb

## 2019-04-11 DIAGNOSIS — I10 Essential (primary) hypertension: Secondary | ICD-10-CM

## 2019-04-11 DIAGNOSIS — Z23 Encounter for immunization: Secondary | ICD-10-CM | POA: Diagnosis not present

## 2019-04-11 DIAGNOSIS — E1140 Type 2 diabetes mellitus with diabetic neuropathy, unspecified: Secondary | ICD-10-CM | POA: Diagnosis not present

## 2019-04-11 DIAGNOSIS — E785 Hyperlipidemia, unspecified: Secondary | ICD-10-CM | POA: Diagnosis not present

## 2019-04-11 DIAGNOSIS — Z0000 Encounter for general adult medical examination without abnormal findings: Secondary | ICD-10-CM

## 2019-04-11 MED ORDER — SILDENAFIL CITRATE 20 MG PO TABS: 80.0000 mg | ORAL_TABLET | Freq: Every evening | ORAL | 3 refills | Status: DC | PRN

## 2019-04-11 MED ORDER — AMLODIPINE BESYLATE 10 MG PO TABS: 10.0000 mg | ORAL_TABLET | Freq: Every day | ORAL | 6 refills | Status: AC

## 2019-04-11 NOTE — Progress Notes (Signed)
Pre visit review using our clinic review tool, if applicable. No additional management support is needed unless otherwise documented below in the visit note. 

## 2019-04-11 NOTE — Patient Instructions (Addendum)
Per our records you are due for an eye exam. Please contact your eye doctor to schedule an appointment. Please have them send copies of your office visit notes to [place]. Our fax number is [phone].   GO TO THE FRONT DESK Schedule your next appointment for a checkup in 3 months  Increase amlodipine to 10 mg: 1 tablet daily Other medications the same   Check the  blood pressure weekly BP GOAL is between 110/65 and  135/85. If it is consistently higher or lower, let me know
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
## 2019-04-11 NOTE — Assessment & Plan Note (Addendum)
--  Td 2015 -  PNM 2015 -  Prevnar: 2016 - shingrix :d/w pt, provider #1 today, come back into 3 months -  Flu shot : today -- CCS:  cscope 04-2013, next  10 years per report --Prostate cancer screening: DRE-PSA wnl 2019 --Diet and exercise: .not exercising much, active at work; diet d/w pt   -Labs reviewed, will redo labs in 3 months.

## 2019-04-12 NOTE — Assessment & Plan Note (Signed)
HTN: Currently on amlodipine 5 mg, losartan, metoprolol.  Ambulatory BPs still elevated between 150 and 160.  I rechecked BP manually: 150/95.  Plan: Low-salt diet, increase amlodipine to 10 mg, monitor at home and recheck on RTC DM: Last A1c satisfactory, CBGs in the morning 141, 130.  After dinner 209, 196.  Continue metformin, Actos.  Increase physical activity, watch diet. High cholesterol: Satisfactory ED: Viagra works but he only gets a few pills a month.  Change to sildenafil 20 mg #60 and refills. RTC 3 months

## 2019-07-04 ENCOUNTER — Other Ambulatory Visit: Payer: Self-pay | Admitting: Internal Medicine

## 2019-07-04 NOTE — Telephone Encounter (Signed)
Last OV 04/11/19 Last refill(s) 01/07/19 #90/1 Next OV 07/11/19

## 2019-07-08 ENCOUNTER — Other Ambulatory Visit: Payer: Self-pay | Admitting: Internal Medicine

## 2019-07-10 ENCOUNTER — Other Ambulatory Visit: Payer: Self-pay

## 2019-07-10 ENCOUNTER — Ambulatory Visit: Payer: BC Managed Care – PPO | Admitting: Internal Medicine

## 2019-07-10 ENCOUNTER — Encounter: Payer: Self-pay | Admitting: Internal Medicine

## 2019-07-10 VITALS — BP 137/78 | HR 77 | Temp 97.4°F | Resp 12 | Ht 73.0 in | Wt 242.2 lb

## 2019-07-10 DIAGNOSIS — I10 Essential (primary) hypertension: Secondary | ICD-10-CM

## 2019-07-10 DIAGNOSIS — E1140 Type 2 diabetes mellitus with diabetic neuropathy, unspecified: Secondary | ICD-10-CM | POA: Diagnosis not present

## 2019-07-10 LAB — BASIC METABOLIC PANEL
BUN: 14 mg/dL (ref 6–23)
CO2: 27 mEq/L (ref 19–32)
Calcium: 9.6 mg/dL (ref 8.4–10.5)
Chloride: 101 mEq/L (ref 96–112)
Creatinine, Ser: 0.83 mg/dL (ref 0.40–1.50)
GFR: 95.44 mL/min (ref >60.00)
Glucose, Bld: 94 mg/dL (ref 70–99)
Potassium: 4.9 mEq/L (ref 3.5–5.1)
Sodium: 138 mEq/L (ref 135–145)

## 2019-07-10 LAB — HEMOGLOBIN A1C: Hgb A1c MFr Bld: 7.1 % — ABNORMAL HIGH (ref 4.6–6.5)

## 2019-07-10 NOTE — Patient Instructions (Signed)
GO TO THE LAB : Get the blood work     GO TO THE FRONT DESK Schedule your next appointment   for a checkup in 4 months   Check the  blood pressure 2 or 3 times a month   BP GOAL is between 110/65 and  135/85. If it is consistently higher or lower, let me know

## 2019-07-10 NOTE — Progress Notes (Signed)
Subjective:    Patient ID: Barry Bush, male    DOB: April 03, 1961, 59 y.o.   MRN: [id number]  DOS:  07/10/2019 Type of visit - description: Routine office visit Here for hypertension and diabetes management   Review of Systems In general feels well. Admits to a lot of  stress, work and quarantine related. Denies chest pain, difficulty breathing no lower extremity edema Diet has not been the best lately.  He remains active at work  Past Medical History:  Diagnosis Date  . Acute serous otitis media of left ear 10/2014   Dr. Ilda Foil  . Diabetes mellitus 08-2010  . Esophageal stricture 2003   s/p dilation  . Eustachian tube disorder 10/2014   Dr. Ilda Foil  . GERD (gastroesophageal reflux disease)   . Gout   . Hypertension   . Psoriasis    sees derm  . PSORIASIS 10/16/2006   Qualifier: Diagnosis of  By: Dawson Bills      Past Surgical History:  Procedure Laterality Date  . Facial Trauma      Social History   Socioeconomic History  . Marital status: Married    Spouse name: Not on file  . Number of children: 1  . Years of education: Not on file  . Highest education level: Not on file  Occupational History  . 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 Smoking status: Never Smoker  . Smokeless tobacco: Never Used  Substance and Sexual Activity  . Alcohol use: Yes    Comment: 3 days a week  . Drug use: No  . Sexual activity: Not on file  Other Topics Concern  . Not on file  Social History Narrative   Household- pt and wife   Social Determinants of Health   Financial Resource Strain:   . Difficulty of Paying Living Expenses: Not on file  Food Insecurity:   . Worried About Charity fundraiser in the Last Year: Not on file  . Ran Out of Food in the Last Year: Not on file  Transportation Needs:   . Lack of Transportation (Medical): Not on file  . Lack of Transportation (Non-Medical): Not on file  Physical  Activity:   . Days of Exercise per Week: Not on file  . Minutes of Exercise per Session: Not on file  Stress:   . Feeling of Stress : Not on file  Social Connections:   . Frequency of Communication with Friends and Family: Not on file  . Frequency of Social Gatherings with Friends and Family: Not on file  . Attends Religious Services: Not on file  . Active Member of Clubs or Organizations: Not on file  . Attends Archivist Meetings: Not on file  . Marital Status: Not on file  Intimate Partner Violence:   . Fear of Current or Ex-Partner: Not on file  . Emotionally Abused: Not on file  . Physically Abused: Not on file  . Sexually Abused: Not on file      Allergies as of 07/10/2019   No Known Allergies     Medication List       Accurate as of July 10, 2019 11:59 PM. If you have any questions, ask your nurse or doctor.        allopurinol 100 MG tablet Commonly known as: ZYLOPRIM Take 1 tablet (100 mg total) by mouth daily.   amLODipine 10 MG tablet Commonly known as: NORVASC Take 1  tablet (10 mg total) by mouth daily.   aspirin 81 MG tablet Take 81 mg by mouth daily.   atorvastatin 10 MG tablet Commonly known as: LIPITOR TAKE 1 TABLET BY MOUTH DAILY.   fluticasone 50 MCG/ACT nasal spray Commonly known as: FLONASE Place 2 sprays into both nostrils daily.   gabapentin 300 MG capsule Commonly known as: NEURONTIN Take 1 capsule (300 mg total) by mouth at bedtime.   loratadine 10 MG tablet Commonly known as: CLARITIN TAKE 1 TABLET BY MOUTH DAILY.   losartan 100 MG tablet Commonly known as: COZAAR Take 1 tablet (100 mg total) by mouth daily.   metFORMIN 850 MG tablet Commonly known as: GLUCOPHAGE TAKE 2 TABLETS BY MOUTH WITH BREAKFAST AND 1 TABLET BY MOUTH WITH DINNER.   metoprolol tartrate 100 MG tablet Commonly known as: LOPRESSOR TAKE 1 TABLET BY MOUTH DAILY.   omeprazole 20 MG capsule Commonly known as: PRILOSEC Take 20 mg by mouth  daily.   pioglitazone 30 MG tablet Commonly known as: ACTOS Take 1 tablet (30 mg total) by mouth daily.   sildenafil 100 MG tablet Commonly known as: VIAGRA Take 1 tablet (100 mg total) by mouth daily as needed for erectile dysfunction.   sildenafil 20 MG tablet Commonly known as: REVATIO Take 4-5 tablets (80-100 mg total) by mouth at bedtime as needed.           Objective:   Physical Exam BP 137/78 (BP Location: Right Arm, Cuff Size: Large)   Pulse 77   Temp (!) 97.4 F (36.3 C) (Temporal)   Resp 12   Ht 6\' 1"  (1.854 m)   Wt 242 lb 3.2 oz (109.9 kg)   SpO2 100%   BMI 31.95 kg/m  General:   Well developed, NAD, BMI noted. HEENT:  Normocephalic . Face symmetric, atraumatic Lungs:  CTA B Normal respiratory effort, no intercostal retractions, no accessory muscle use. Heart: RRR,  no murmur.  No pretibial edema bilaterally  Skin: Not pale. Not jaundice Neurologic:  alert & oriented X3.  Speech normal, gait appropriate for age and unassisted Psych--  Cognition and judgment appear intact.  Cooperative with normal attention span and concentration.  Behavior appropriate. No anxious or depressed appearing.      Assessment     Assessment DM  dx 2012 Mild neuropathy dx 10-2015 HTN GERD Esophageal stricture, dilatation 2003 Gout Psoriasis --- sees dermatology, on  otezla  Eustachian  tube disorder, saw ENT 2016 B12 deficiency DX 02-2019  PLAN: DM: Last A1c 6.8, currently on Metformin, Actos.  Check A1c.  Encouraged to improve his diet. HTN: Amlodipine was increased to 10 mg daily good compliance, no apparent side effects.  He also takes losartan, metoprolol.  Ambulatory BPs are rarely checked.  BP today is okay.  Plan: Continue the same medications, check a BMP, monitor BPs. Covid vaccine: Encouraged to get when available. RTC 4 months   This visit occurred during the SARS-CoV-2 public health emergency.  Safety protocols were in place, including screening  questions prior to the visit, additional usage of staff PPE, and extensive cleaning of exam room while observing appropriate contact time as indicated for disinfecting solutions.

## 2019-07-11 ENCOUNTER — Ambulatory Visit: Payer: BC Managed Care – PPO | Admitting: Internal Medicine

## 2019-07-11 NOTE — Assessment & Plan Note (Signed)
DM: Last A1c 6.8, currently on Metformin, Actos.  Check A1c.  Encouraged to improve his diet. HTN: Amlodipine was increased to 10 mg daily good compliance, no apparent side effects.  He also takes losartan, metoprolol.  Ambulatory BPs are rarely checked.  BP today is okay.  Plan: Continue the same medications, check a BMP, monitor BPs. Covid vaccine: Encouraged to get when available. RTC 4 months

## 2019-07-12 ENCOUNTER — Other Ambulatory Visit: Payer: Self-pay | Admitting: Internal Medicine

## 2019-08-31 ENCOUNTER — Other Ambulatory Visit: Payer: Self-pay | Admitting: Internal Medicine

## 2019-09-02 ENCOUNTER — Other Ambulatory Visit: Payer: Self-pay

## 2019-09-02 MED ORDER — PIOGLITAZONE HCL 30 MG PO TABS: 30.0000 mg | ORAL_TABLET | Freq: Every day | ORAL | 1 refills | Status: DC

## 2019-09-09 ENCOUNTER — Telehealth: Payer: Self-pay | Admitting: Internal Medicine

## 2019-09-09 NOTE — Telephone Encounter (Signed)
No contraindications, encouraged him to proceed.

## 2019-09-09 NOTE — Telephone Encounter (Signed)
Please advise 

## 2019-09-09 NOTE — Telephone Encounter (Signed)
Please Advise  Patient would like to know weather he should get Covid Vaccine tomorrow. Patient wants to sure if theres anything in his chart (medicaal condition ) that would hold him up from getting shot.

## 2019-09-09 NOTE — Telephone Encounter (Signed)
Spoke w/ Pt- informed of PCP recommendations. Pt verbalized understanding.  

## 2019-11-09 ENCOUNTER — Other Ambulatory Visit: Payer: Self-pay | Admitting: Internal Medicine

## 2019-11-12 ENCOUNTER — Encounter: Payer: Self-pay | Admitting: Internal Medicine

## 2019-11-12 ENCOUNTER — Ambulatory Visit: Payer: BC Managed Care – PPO | Admitting: Internal Medicine

## 2019-11-12 ENCOUNTER — Other Ambulatory Visit: Payer: Self-pay

## 2019-11-12 VITALS — BP 159/79 | HR 66 | Temp 96.3°F | Resp 18 | Ht 73.0 in | Wt 235.5 lb

## 2019-11-12 DIAGNOSIS — I10 Essential (primary) hypertension: Secondary | ICD-10-CM | POA: Diagnosis not present

## 2019-11-12 DIAGNOSIS — E1140 Type 2 diabetes mellitus with diabetic neuropathy, unspecified: Secondary | ICD-10-CM

## 2019-11-12 DIAGNOSIS — R202 Paresthesia of skin: Secondary | ICD-10-CM | POA: Diagnosis not present

## 2019-11-12 LAB — BASIC METABOLIC PANEL
BUN: 14 mg/dL (ref 6–23)
CO2: 30 mEq/L (ref 19–32)
Calcium: 8.9 mg/dL (ref 8.4–10.5)
Chloride: 103 mEq/L (ref 96–112)
Creatinine, Ser: 0.82 mg/dL (ref 0.40–1.50)
GFR: 96.67 mL/min (ref >60.00)
Glucose, Bld: 124 mg/dL — ABNORMAL HIGH (ref 70–99)
Potassium: 4.3 mEq/L (ref 3.5–5.1)
Sodium: 139 mEq/L (ref 135–145)

## 2019-11-12 LAB — HEMOGLOBIN A1C: Hgb A1c MFr Bld: 6.8 % — ABNORMAL HIGH (ref 4.6–6.5)

## 2019-11-12 MED ORDER — METOPROLOL TARTRATE 100 MG PO TABS: 100.0000 mg | ORAL_TABLET | Freq: Two times a day (BID) | ORAL | 1 refills | Status: DC

## 2019-11-12 NOTE — Progress Notes (Signed)
Subjective:    Patient ID: Barry Bush, male    DOB: 11-30-1960, 59 y.o.   MRN: [id number]  DOS:  11/12/2019 Type of visit - description: rov Today with evaluated him for diabetes, hypertension, diabetic neuropathy. Also he has developed left arm tingling, mostly at the third fourth and fifth digits. Above symptoms happening in the context of recent [place] vaccination at the left shoulder. Denies any neck or elbow pain.  Review of Systems Remains active at work and at home Diet: Room for improvement  Past Medical History:  Diagnosis Date  . Acute serous otitis media of left ear 10/2014   Dr. Ilda Foil  . Diabetes mellitus 08-2010  . Esophageal stricture 2003   s/p dilation  . Eustachian tube disorder 10/2014   Dr. Ilda Foil  . GERD (gastroesophageal reflux disease)   . Gout   . Hypertension   . Psoriasis    sees derm  . PSORIASIS 10/16/2006   Qualifier: Diagnosis of  By: Dawson Bills      Past Surgical History:  Procedure Laterality Date  . Facial Trauma      Allergies as of 11/12/2019   No Known Allergies     Medication List       Accurate as of Nov 12, 2019 11:59 PM. If you have any questions, ask your nurse or doctor.        STOP taking these medications   sildenafil 100 MG tablet Commonly known as: VIAGRA Stopped by: Kathlene November, MD     TAKE these medications   allopurinol 100 MG tablet Commonly known as: ZYLOPRIM Take 1 tablet (100 mg total) by mouth daily.   amLODipine 10 MG tablet Commonly known as: NORVASC Take 1 tablet (10 mg total) by mouth daily.   aspirin 81 MG tablet Take 81 mg by mouth daily.   atorvastatin 10 MG tablet Commonly known as: LIPITOR TAKE 1 TABLET BY MOUTH DAILY.   fluticasone 50 MCG/ACT nasal spray Commonly known as: FLONASE Place 2 sprays into both nostrils daily.   gabapentin 300 MG capsule Commonly known as: NEURONTIN TAKE 1 CAPSULE BY MOUTH AT BEDTIME.   loratadine 10 MG tablet Commonly known as:  CLARITIN TAKE 1 TABLET BY MOUTH DAILY.   losartan 100 MG tablet Commonly known as: COZAAR Take 1 tablet (100 mg total) by mouth daily.   metFORMIN 850 MG tablet Commonly known as: GLUCOPHAGE TAKE 2 TABLETS BY MOUTH WITH BREAKFAST AND 1 TABLET BY MOUTH WITH DINNER.   metoprolol tartrate 100 MG tablet Commonly known as: LOPRESSOR Take 1 tablet (100 mg total) by mouth 2 (two) times daily. What changed: when to take this Changed by: Kathlene November, MD   omeprazole 20 MG capsule Commonly known as: PRILOSEC Take 20 mg by mouth daily.   pioglitazone 30 MG tablet Commonly known as: ACTOS Take 1 tablet (30 mg total) by mouth daily.   sildenafil 20 MG tablet Commonly known as: REVATIO Take 4-5 tablets (80-100 mg total) by mouth at bedtime as needed.          Objective:   Physical Exam BP (!) 159/79 (BP Location: Left Arm, Patient Position: Sitting, Cuff Size: Normal)   Pulse 66   Temp (!) 96.3 F (35.7 C) (Temporal)   Resp 18   Ht 6\' 1"  (1.854 m)   Wt 235 lb 8 oz (106.8 kg)   SpO2 99%   BMI 31.07 kg/m  General:   Well developed, NAD, BMI noted. HEENT:  Normocephalic . Face  symmetric, atraumatic Neck: No TTP of the cervical spine Lungs:  CTA B Normal respiratory effort, no intercostal retractions, no accessory muscle use. Heart: RRR,  no murmur.  Diabetic foot exam: No edema, good pedal pulses, pinprick examination normal except for decreased sensation at the distal plantar area bilaterally Skin: Not pale. Not jaundice Neurologic:  alert & oriented X3.  Speech normal, gait appropriate for age and unassisted. Motor and DTRs symmetric  psych--  Cognition and judgment appear intact.  Cooperative with normal attention span and concentration.  Behavior appropriate. No anxious or depressed appearing.      Assessment      Assessment DM  dx 2012 Mild neuropathy dx 10-2015 HTN GERD Esophageal stricture, dilatation 2003 Gout Psoriasis --- sees dermatology, on  otezla   Eustachian  tube disorder, saw ENT 2016 B12 deficiency DX 02-2019  PLAN: DM: Currently on Metformin, Actos, last A1c increased to 7.1.  Ambulatory CBGs fasting 120s, postprandial 170s. We talk about diet, he is active.  Check A1c, consider adjust medications. Diabetic neuropathy: Feet care discussed, continue gabapentin HTN: Currently on losartan and amlodipine.  Also on metoprolol 100 mg at night.  BP today slightly elevated, at home is typically elevated in the 150/90, at least 1 time was 190/110. Plan:  Encouraged low-salt diet, in for provided Check a BMP, increase metoprolol 100 mg to BID (see AVS, decrease metoprolol to 50 mg a.m. and 100 mg p.m. if needed). Consider diuretics but he has a history of gout which is currently controlled. Paresthesias, upper extremities: Probably nerve entrapment, recommend observation for now. RTC 3 months This visit occurred during the SARS-CoV-2 public health emergency.  Safety protocols were in place, including screening questions prior to the visit, additional usage of staff PPE, and extensive cleaning of exam room while observing appropriate contact time as indicated for disinfecting solutions.

## 2019-11-12 NOTE — Progress Notes (Signed)
Pre visit review using our clinic review tool, if applicable. No additional management support is needed unless otherwise documented below in the visit note. 

## 2019-11-12 NOTE — Patient Instructions (Addendum)
Per our records you are due for an eye exam. Please contact your eye doctor to schedule an appointment. Please have them send copies of your office visit notes to [place]. Our fax number is [phone].   Increase metoprolol 100 mg to 1 tablet twice a day, watch your blood pressure and your pulse. If you feel unwell and you think is too much, decrease metoprolol to half tablet in the morning and 1 at night   BP GOAL is between 110/65 and  135/85. If it is consistently higher or lower, let me know  Watch your salt intake  Take care of your feet  GO TO THE LAB : Get the blood work     GO TO THE FRONT DESK, PLEASE SCHEDULE YOUR APPOINTMENTS Come back for a check up in 3 months     DASH Eating Plan DASH stands for "Dietary Approaches to Stop Hypertension." The DASH eating plan is a healthy eating plan that has been shown to reduce high blood pressure (hypertension). It may also reduce your risk for type 2 diabetes, heart disease, and stroke. The DASH eating plan may also help with weight loss. What are tips for following this plan?  General guidelines  Avoid eating more than 2,300 mg (milligrams) of salt (sodium) a day. If you have hypertension, you may need to reduce your sodium intake to 1,500 mg a day.  Limit alcohol intake to no more than 1 drink a day for nonpregnant women and 2 drinks a day for men. One drink equals 12 oz of beer, 5 oz of wine, or 1 oz of hard liquor.  Work with your health care provider to maintain a healthy body weight or to lose weight. Ask what an ideal weight is for you.  Get at least 30 minutes of exercise that causes your heart to beat faster (aerobic exercise) most days of the week. Activities may include walking, swimming, or biking.  Work with your health care provider or diet and nutrition specialist (dietitian) to adjust your eating plan to your individual calorie needs. Reading food labels   Check food labels for the amount of sodium per serving.  Choose foods with less than 5 percent of the Daily Value of sodium. Generally, foods with less than 300 mg of sodium per serving fit into this eating plan.  To find whole grains, look for the word "whole" as the first word in the ingredient list. Shopping  Buy products labeled as "low-sodium" or "no salt added."  Buy fresh foods. Avoid canned foods and premade or frozen meals. Cooking  Avoid adding salt when cooking. Use salt-free seasonings or herbs instead of table salt or sea salt. Check with your health care provider or pharmacist before using salt substitutes.  Do not fry foods. Cook foods using healthy methods such as baking, boiling, grilling, and broiling instead.  Cook with heart-healthy oils, such as olive, canola, soybean, or sunflower oil. Meal planning  Eat a balanced diet that includes: ? 5 or more servings of fruits and vegetables each day. At each meal, try to fill half of your plate with fruits and vegetables. ? Up to 6-8 servings of whole grains each day. ? Less than 6 oz of lean meat, poultry, or fish each day. A 3-oz serving of meat is about the same size as a deck of cards. One egg equals 1 oz. ? 2 servings of low-fat dairy each day. ? A serving of nuts, seeds, or beans 5 times each week. ?  Heart-healthy fats. Healthy fats called Omega-3 fatty acids are found in foods such as flaxseeds and coldwater fish, like sardines, salmon, and mackerel.  Limit how much you eat of the following: ? Canned or prepackaged foods. ? Food that is high in trans fat, such as fried foods. ? Food that is high in saturated fat, such as fatty meat. ? Sweets, desserts, sugary drinks, and other foods with added sugar. ? Full-fat dairy products.  Do not salt foods before eating.  Try to eat at least 2 vegetarian meals each week.  Eat more home-cooked food and less restaurant, buffet, and fast food.  When eating at a restaurant, ask that your food be prepared with less salt or no salt,  if possible. What foods are recommended? The items listed may not be a complete list. Talk with your dietitian about what dietary choices are best for you. Grains Whole-grain or whole-wheat bread. Whole-grain or whole-wheat pasta. Brown rice. [place]. Bulgur. Whole-grain and low-sodium cereals. Pita bread. Low-fat, low-sodium crackers. Whole-wheat flour tortillas. Vegetables Fresh or frozen vegetables (raw, steamed, roasted, or grilled). Low-sodium or reduced-sodium tomato and vegetable juice. Low-sodium or reduced-sodium tomato sauce and tomato paste. Low-sodium or reduced-sodium canned vegetables. Fruits All fresh, dried, or frozen fruit. Canned fruit in natural juice (without added sugar). Meat and other protein foods Skinless chicken or [place]. Ground chicken or [place]. Pork with fat trimmed off. Fish and seafood. Egg whites. Dried beans, peas, or lentils. Unsalted nuts, nut butters, and seeds. Unsalted canned beans. Lean cuts of beef with fat trimmed off. Low-sodium, lean deli meat. Dairy Low-fat (1%) or fat-free (skim) milk. Fat-free, low-fat, or reduced-fat cheeses. Nonfat, low-sodium ricotta or cottage cheese. Low-fat or nonfat yogurt. Low-fat, low-sodium cheese. Fats and oils Soft margarine without trans fats. Vegetable oil. Low-fat, reduced-fat, or light mayonnaise and salad dressings (reduced-sodium). Canola, safflower, olive, soybean, and sunflower oils. Avocado. Seasoning and other foods Herbs. Spices. Seasoning mixes without salt. Unsalted popcorn and pretzels. Fat-free sweets. What foods are not recommended? The items listed may not be a complete list. Talk with your dietitian about what dietary choices are best for you. Grains Baked goods made with fat, such as croissants, muffins, or some breads. Dry pasta or rice meal packs. Vegetables Creamed or fried vegetables. Vegetables in a cheese sauce. Regular canned vegetables (not low-sodium or reduced-sodium). Regular canned  tomato sauce and paste (not low-sodium or reduced-sodium). Regular tomato and vegetable juice (not low-sodium or reduced-sodium). Angie Fava. Olives. Fruits Canned fruit in a light or heavy syrup. Fried fruit. Fruit in cream or butter sauce. Meat and other protein foods Fatty cuts of meat. Ribs. Fried meat. Berniece Salines. Sausage. Bologna and other processed lunch meats. Salami. Fatback. Hotdogs. Bratwurst. Salted nuts and seeds. Canned beans with added salt. Canned or smoked fish. Whole eggs or egg yolks. Chicken or [place] with skin. Dairy Whole or 2% milk, cream, and half-and-half. Whole or full-fat cream cheese. Whole-fat or sweetened yogurt. Full-fat cheese. Nondairy creamers. Whipped toppings. Processed cheese and cheese spreads. Fats and oils Butter. Stick margarine. Lard. Shortening. Ghee. Bacon fat. Tropical oils, such as coconut, palm kernel, or palm oil. Seasoning and other foods Salted popcorn and pretzels. Onion salt, garlic salt, seasoned salt, table salt, and sea salt. Worcestershire sauce. Tartar sauce. Barbecue sauce. Teriyaki sauce. Soy sauce, including reduced-sodium. Steak sauce. Canned and packaged gravies. Fish sauce. Oyster sauce. Cocktail sauce. Horseradish that you find on the shelf. Ketchup. Mustard. Meat flavorings and tenderizers. Bouillon cubes. Hot sauce and Tabasco sauce.  Premade or packaged marinades. Premade or packaged taco seasonings. Relishes. Regular salad dressings. Where to find more information:  National Heart, Lung, and [place]: https://wilson-eaton.com/  American Heart Association: www.heart.org Summary  The DASH eating plan is a healthy eating plan that has been shown to reduce high blood pressure (hypertension). It may also reduce your risk for type 2 diabetes, heart disease, and stroke.  With the DASH eating plan, you should limit salt (sodium) intake to 2,300 mg a day. If you have hypertension, you may need to reduce your sodium intake to 1,500 mg a day.  When  on the DASH eating plan, aim to eat more fresh fruits and vegetables, whole grains, lean proteins, low-fat dairy, and heart-healthy fats.  Work with your health care provider or diet and nutrition specialist (dietitian) to adjust your eating plan to your individual calorie needs. This information is not intended to replace advice given to you by your health care provider. Make sure you discuss any questions you have with your health care provider. Document Revised: 05/26/2017 Document Reviewed: 06/06/2016 Elsevier Patient Education  2020 [employer].

## 2019-11-13 NOTE — Assessment & Plan Note (Signed)
DM: Currently on Metformin, Actos, last A1c increased to 7.1.  Ambulatory CBGs fasting 120s, postprandial 170s. We talk about diet, he is active.  Check A1c, consider adjust medications. Diabetic neuropathy: Feet care discussed, continue gabapentin HTN: Currently on losartan and amlodipine.  Also on metoprolol 100 mg at night.  BP today slightly elevated, at home is typically elevated in the 150/90, at least 1 time was 190/110. Plan:  Encouraged low-salt diet, in for provided Check a BMP, increase metoprolol 100 mg to BID (see AVS, decrease metoprolol to 50 mg a.m. and 100 mg p.m. if needed). Consider diuretics but he has a history of gout which is currently controlled. Paresthesias, upper extremities: Probably nerve entrapment, recommend observation for now. RTC 3 months

## 2019-12-11 ENCOUNTER — Other Ambulatory Visit: Payer: Self-pay

## 2019-12-11 MED ORDER — LOSARTAN POTASSIUM 100 MG PO TABS: 100.0000 mg | ORAL_TABLET | Freq: Every day | ORAL | 1 refills | Status: DC

## 2019-12-19 ENCOUNTER — Other Ambulatory Visit: Payer: Self-pay | Admitting: Internal Medicine

## 2019-12-31 ENCOUNTER — Other Ambulatory Visit: Payer: Self-pay | Admitting: Internal Medicine

## 2020-01-02 ENCOUNTER — Encounter: Payer: Self-pay | Admitting: Internal Medicine

## 2020-01-28 ENCOUNTER — Ambulatory Visit (HOSPITAL_BASED_OUTPATIENT_CLINIC_OR_DEPARTMENT_OTHER)
Admission: RE | Admit: 2020-01-28 | Discharge: 2020-01-28 | Disposition: A | Discharge status: Home / Self Care | Payer: BC Managed Care – PPO | Source: Ambulatory Visit | Attending: Internal Medicine | Admitting: Internal Medicine

## 2020-01-28 ENCOUNTER — Encounter: Payer: Self-pay | Admitting: Internal Medicine

## 2020-01-28 ENCOUNTER — Other Ambulatory Visit: Payer: Self-pay

## 2020-01-28 ENCOUNTER — Ambulatory Visit: Payer: BC Managed Care – PPO | Admitting: Internal Medicine

## 2020-01-28 VITALS — BP 136/72 | HR 54 | Temp 98.2°F | Resp 18 | Ht 73.0 in | Wt 237.4 lb

## 2020-01-28 DIAGNOSIS — M25562 Pain in left knee: Secondary | ICD-10-CM | POA: Insufficient documentation

## 2020-01-28 DIAGNOSIS — M76892 Other specified enthesopathies of left lower limb, excluding foot: Secondary | ICD-10-CM | POA: Diagnosis not present

## 2020-01-28 NOTE — Progress Notes (Signed)
Subjective:    Patient ID: Barry Bush, male    DOB: 25-Nov-1960, 59 y.o.   MRN: [id number]  DOS:  01/28/2020 Type of visit - description: Acute  Has  left knee pain on and off, mild. 3 days ago he mowed the yard that was on an incline. Since then having more left knee pain. No swelling, redness, warmness. Aleve helps. Is more tender at the medial aspect of the knee    Review of Systems See above   Past Medical History:  Diagnosis Date  . Acute serous otitis media of left ear 10/2014   Dr. Verne Spurr  . Diabetes mellitus 08-2010  . Esophageal stricture 2003   s/p dilation  . Eustachian tube disorder 10/2014   Dr. Verne Spurr  . GERD (gastroesophageal reflux disease)   . Gout   . Hypertension   . Psoriasis    sees derm  . PSORIASIS 10/16/2006   Qualifier: Diagnosis of  By: Shary Decamp      Past Surgical History:  Procedure Laterality Date  . Facial Trauma      Allergies as of 01/28/2020   No Known Allergies     Medication List       Accurate as of January 28, 2020 11:59 PM. If you have any questions, ask your nurse or doctor.        allopurinol 100 MG tablet Commonly known as: ZYLOPRIM Take 1 tablet (100 mg total) by mouth daily.   amLODipine 10 MG tablet Commonly known as: NORVASC Take 1 tablet (10 mg total) by mouth daily.   aspirin 81 MG tablet Take 81 mg by mouth daily.   atorvastatin 10 MG tablet Commonly known as: LIPITOR Take 1 tablet (10 mg total) by mouth daily.   fluticasone 50 MCG/ACT nasal spray Commonly known as: FLONASE Place 2 sprays into both nostrils daily.   gabapentin 300 MG capsule Commonly known as: NEURONTIN TAKE 1 CAPSULE BY MOUTH AT BEDTIME.   loratadine 10 MG tablet Commonly known as: CLARITIN TAKE 1 TABLET BY MOUTH DAILY.   losartan 100 MG tablet Commonly known as: COZAAR Take 1 tablet (100 mg total) by mouth daily.   metFORMIN 850 MG tablet Commonly known as: GLUCOPHAGE TAKE 2 TABLETS BY MOUTH WITH BREAKFAST  AND 1 TABLET BY MOUTH WITH DINNER.   metoprolol tartrate 100 MG tablet Commonly known as: LOPRESSOR Take 1 tablet (100 mg total) by mouth 2 (two) times daily.   omeprazole 20 MG capsule Commonly known as: PRILOSEC Take 20 mg by mouth daily.   pioglitazone 30 MG tablet Commonly known as: ACTOS Take 1 tablet (30 mg total) by mouth daily.   sildenafil 20 MG tablet Commonly known as: REVATIO Take 4-5 tablets (80-100 mg total) by mouth at bedtime as needed.          Objective:   Physical Exam Musculoskeletal:       Legs:    BP 136/72 (BP Location: Right Arm, Patient Position: Sitting, Cuff Size: Normal)   Pulse (!) 54   Temp 98.2 F (36.8 C) (Oral)   Resp 18   Ht 6\' 1"  (1.854 m)   Wt 237 lb 6 oz (107.7 kg)   SpO2 97%   BMI 31.32 kg/m  General:   Well developed, NAD, BMI noted. HEENT:  Normocephalic . Face symmetric, atraumatic  MSK: Right knee normal Left knee: No warm,no red, no obvious effusion, knee seems stable.  Is a slightly TTP, see graphic. Skin: Not pale. Not jaundice Neurologic:  alert & oriented X3.  Speech normal, gait appropriate for age and unassisted Psych--  Cognition and judgment appear intact.  Cooperative with normal attention span and concentration.  Behavior appropriate. No anxious or depressed appearing.      Assessment    Assessment DM  dx 2012 Mild neuropathy dx 10-2015 HTN GERD Esophageal stricture, dilatation 2003 Gout Psoriasis --- sees dermatology, on  otezla  Eustachian  tube disorder, saw ENT 2016 B12 deficiency DX 02-2019  PLAN: Left knee pain: Acute on chronic, after he mowed a yard that was on an incline, suspect meniscal injury. We agreed on conservative treatment with ice, a brace, judicious use of ibuprofen, Tylenol, x-ray. If not better he will let me know for a referral. NSAIDs GI precautions discussed     This visit occurred during the SARS-CoV-2 public health emergency.  Safety protocols were in place,  including screening questions prior to the visit, additional usage of staff PPE, and extensive cleaning of exam room while observing appropriate contact time as indicated for disinfecting solutions.

## 2020-01-28 NOTE — Progress Notes (Signed)
Pre visit review using our clinic review tool, if applicable. No additional management support is needed unless otherwise documented below in the visit note. 

## 2020-01-28 NOTE — Patient Instructions (Addendum)
Per our records you are due for an eye exam. Please contact your eye doctor to schedule an appointment. Please have them send copies of your office visit notes to [place]. Our fax number is [phone].   Please get your x-ray downstairs  Ice the left knee twice a day  Get a good quality brace use it  during the daytime.  For pain control:  IBUPROFEN (Advil or Motrin) 200 mg 2 tablets every 12 hours as needed for pain.  Always take it with food because may cause gastritis and ulcers.  If you notice nausea, stomach pain, change in the color of stools --->  Stop the medicine and let us know  Tylenol  500 mg OTC 2 tabs a day every 8 hours as needed for pain

## 2020-01-30 NOTE — Assessment & Plan Note (Signed)
Left knee pain: Acute on chronic, after he mowed a yard that was on an incline, suspect meniscal injury. We agreed on conservative treatment with ice, a brace, judicious use of ibuprofen, Tylenol, x-ray. If not better he will let me know for a referral. NSAIDs GI precautions discussed

## 2020-02-12 ENCOUNTER — Other Ambulatory Visit: Payer: Self-pay

## 2020-02-12 ENCOUNTER — Ambulatory Visit: Payer: BC Managed Care – PPO | Admitting: Internal Medicine

## 2020-02-12 ENCOUNTER — Encounter: Payer: Self-pay | Admitting: Internal Medicine

## 2020-02-12 VITALS — BP 160/95 | HR 51 | Temp 98.0°F | Resp 16 | Ht 73.0 in | Wt 237.1 lb

## 2020-02-12 DIAGNOSIS — I10 Essential (primary) hypertension: Secondary | ICD-10-CM

## 2020-02-12 DIAGNOSIS — E1140 Type 2 diabetes mellitus with diabetic neuropathy, unspecified: Secondary | ICD-10-CM | POA: Diagnosis not present

## 2020-02-12 DIAGNOSIS — E785 Hyperlipidemia, unspecified: Secondary | ICD-10-CM

## 2020-02-12 MED ORDER — VALSARTAN 320 MG PO TABS: 320.0000 mg | ORAL_TABLET | Freq: Every day | ORAL | 3 refills | Status: DC

## 2020-02-12 NOTE — Patient Instructions (Addendum)
Per our records you are due for an eye exam. Please contact your eye doctor to schedule an appointment. Please have them send copies of your office visit notes to [place]. Our fax number is [phone].  Stop losartan  Start valsartan 320 mg 1 tablet at night  Check the  blood pressure   daily BP GOAL is between 110/65 and  135/85. If it is consistently higher or lower, let me know      GO TO THE FRONT DESK, PLEASE SCHEDULE YOUR APPOINTMENTS Come back for blood work in 3 weeks    Come back for a physical exam by 04-2020

## 2020-02-12 NOTE — Progress Notes (Signed)
Subjective:    Patient ID: Barry Bush, male    DOB: 04-13-1961, 59 y.o.   MRN: [id number]  DOS:  02/12/2020 Type of visit - description: Routine checkup In general feels well. Good compliance to medication. BP noted to be elevated today and is slightly elevated at home. Denies eating excessive salt, not taking NSAIDs. Was seen with knee pain before, that resolved.   Review of Systems See above   Past Medical History:  Diagnosis Date  . Acute serous otitis media of left ear 10/2014   Dr. Verne Spurr  . Diabetes mellitus 08-2010  . Esophageal stricture 2003   s/p dilation  . Eustachian tube disorder 10/2014   Dr. Verne Spurr  . GERD (gastroesophageal reflux disease)   . Gout   . Hypertension   . Psoriasis    sees derm  . PSORIASIS 10/16/2006   Qualifier: Diagnosis of  By: Shary Decamp      Past Surgical History:  Procedure Laterality Date  . Facial Trauma      Allergies as of 02/12/2020   No Known Allergies     Medication List       Accurate as of February 12, 2020  8:52 AM. If you have any questions, ask your nurse or doctor.        allopurinol 100 MG tablet Commonly known as: ZYLOPRIM Take 1 tablet (100 mg total) by mouth daily.   amLODipine 10 MG tablet Commonly known as: NORVASC Take 1 tablet (10 mg total) by mouth daily.   aspirin 81 MG tablet Take 81 mg by mouth daily.   atorvastatin 10 MG tablet Commonly known as: LIPITOR Take 1 tablet (10 mg total) by mouth daily.   fluticasone 50 MCG/ACT nasal spray Commonly known as: FLONASE Place 2 sprays into both nostrils daily.   gabapentin 300 MG capsule Commonly known as: NEURONTIN TAKE 1 CAPSULE BY MOUTH AT BEDTIME.   loratadine 10 MG tablet Commonly known as: CLARITIN TAKE 1 TABLET BY MOUTH DAILY.   losartan 100 MG tablet Commonly known as: COZAAR Take 1 tablet (100 mg total) by mouth daily.   metFORMIN 850 MG tablet Commonly known as: GLUCOPHAGE TAKE 2 TABLETS BY MOUTH WITH BREAKFAST  AND 1 TABLET BY MOUTH WITH DINNER.   metoprolol tartrate 100 MG tablet Commonly known as: LOPRESSOR Take 1 tablet (100 mg total) by mouth 2 (two) times daily.   omeprazole 20 MG capsule Commonly known as: PRILOSEC Take 20 mg by mouth daily.   pioglitazone 30 MG tablet Commonly known as: ACTOS Take 1 tablet (30 mg total) by mouth daily.   sildenafil 20 MG tablet Commonly known as: REVATIO Take 4-5 tablets (80-100 mg total) by mouth at bedtime as needed.          Objective:   Physical Exam BP (!) 167/102 (BP Location: Left Arm, Patient Position: Sitting, Cuff Size: Normal)   Pulse (!) 51   Temp 98 F (36.7 C) (Oral)   Resp 16   Ht 6\' 1"  (1.854 m)   Wt 237 lb 2 oz (107.6 kg)   SpO2 96%   BMI 31.28 kg/m  General:   Well developed, NAD, BMI noted. HEENT:  Normocephalic . Face symmetric, atraumatic Lungs:  CTA B Normal respiratory effort, no intercostal retractions, no accessory muscle use. Heart: RRR,  no murmur.  Lower extremities: no pretibial edema bilaterally  Skin: Not pale. Not jaundice Neurologic:  alert & oriented X3.  Speech normal, gait appropriate for age and unassisted Psych--  Cognition and judgment appear intact.  Cooperative with normal attention span and concentration.  Behavior appropriate. No anxious or depressed appearing.      Assessment     Assessment DM  dx 2012 Mild neuropathy dx 10-2015 HTN GERD Esophageal stricture, dilatation 2003 Gout Psoriasis --- sees dermatology, on  otezla  Eustachian  tube disorder, saw ENT 2016 B12 deficiency DX 02-2019  PLAN: DM: Continue Metformin, Actos, check A1c HTN: Slightly elevated at home, 150/109 today.  Elevated here today, I rechecked manually right arm: 160/95. We will continue amlodipine, metoprolol and change losartan to Diovan.  Monitor BPs daily, BMP and CBC in 3 weeks High cholesterol: On Lipitor, check FLP Knee pain: See last visit, x-ray of the knee nonacute, sxs better  RTC 3 weeks  for blood work only RTC CPX 04-2020   This visit occurred during the SARS-CoV-2 public health emergency.  Safety protocols were in place, including screening questions prior to the visit, additional usage of staff PPE, and extensive cleaning of exam room while observing appropriate contact time as indicated for disinfecting solutions.

## 2020-02-12 NOTE — Progress Notes (Signed)
Pre visit review using our clinic review tool, if applicable. No additional management support is needed unless otherwise documented below in the visit note. 

## 2020-02-13 NOTE — Assessment & Plan Note (Signed)
DM: Continue Metformin, Actos, check A1c HTN: Slightly elevated at home, 150/109 today.  Elevated here today, I rechecked manually right arm: 160/95. We will continue amlodipine, metoprolol and change losartan to Diovan.  Monitor BPs daily, BMP and CBC in 3 weeks High cholesterol: On Lipitor, check FLP Knee pain: See last visit, x-ray of the knee nonacute, sxs better  RTC 3 weeks for blood work only RTC CPX 04-2020

## 2020-02-26 ENCOUNTER — Other Ambulatory Visit: Payer: Self-pay | Admitting: Internal Medicine

## 2020-02-28 NOTE — Addendum Note (Signed)
Addended by: Mervin Kung A on: 02/28/2020 11:04 AM   Modules accepted: Orders

## 2020-03-05 ENCOUNTER — Other Ambulatory Visit: Payer: Self-pay | Admitting: Internal Medicine

## 2020-03-06 ENCOUNTER — Other Ambulatory Visit (INDEPENDENT_AMBULATORY_CARE_PROVIDER_SITE_OTHER): Payer: BC Managed Care – PPO

## 2020-03-06 ENCOUNTER — Other Ambulatory Visit: Payer: Self-pay

## 2020-03-06 DIAGNOSIS — I10 Essential (primary) hypertension: Secondary | ICD-10-CM | POA: Diagnosis not present

## 2020-03-06 DIAGNOSIS — E1140 Type 2 diabetes mellitus with diabetic neuropathy, unspecified: Secondary | ICD-10-CM

## 2020-03-06 DIAGNOSIS — E785 Hyperlipidemia, unspecified: Secondary | ICD-10-CM | POA: Diagnosis not present

## 2020-03-07 LAB — HEMOGLOBIN A1C
Hgb A1c MFr Bld: 6.5 % of total Hgb — ABNORMAL HIGH (ref <5.7)
Mean Plasma Glucose: 140 (calc)
eAG (mmol/L): 7.7 (calc)

## 2020-03-07 LAB — CBC WITH DIFFERENTIAL/PLATELET
Absolute Monocytes: 686 {cells}/uL (ref 200–950)
Basophils Absolute: 49 {cells}/uL (ref 0–200)
Basophils Relative: 0.7 %
Eosinophils Absolute: 301 {cells}/uL (ref 15–500)
Eosinophils Relative: 4.3 %
HCT: 37.2 % — ABNORMAL LOW (ref 38.5–50.0)
Hemoglobin: 12.4 g/dL — ABNORMAL LOW (ref 13.2–17.1)
Lymphs Abs: 1981 {cells}/uL (ref 850–3900)
MCH: 30.5 pg (ref 27.0–33.0)
MCHC: 33.3 g/dL (ref 32.0–36.0)
MCV: 91.6 fL (ref 80.0–100.0)
MPV: 9.9 fL (ref 7.5–12.5)
Monocytes Relative: 9.8 %
Neutro Abs: 3983 {cells}/uL (ref 1500–7800)
Neutrophils Relative %: 56.9 %
Platelets: 246 Thousand/uL (ref 140–400)
RBC: 4.06 Million/uL — ABNORMAL LOW (ref 4.20–5.80)
RDW: 12.9 % (ref 11.0–15.0)
Total Lymphocyte: 28.3 %
WBC: 7 Thousand/uL (ref 3.8–10.8)

## 2020-03-07 LAB — LIPID PANEL
Cholesterol: 142 mg/dL (ref <200)
HDL: 57 mg/dL (ref >=40)
LDL Cholesterol (Calc): 70 mg/dL
Non-HDL Cholesterol (Calc): 85 mg/dL (ref <130)
Total CHOL/HDL Ratio: 2.5 (calc) (ref <5.0)
Triglycerides: 71 mg/dL (ref <150)

## 2020-03-07 LAB — BASIC METABOLIC PANEL
BUN: 15 mg/dL (ref 7–25)
CO2: 28 mmol/L (ref 20–32)
Calcium: 9.1 mg/dL (ref 8.6–10.3)
Chloride: 101 mmol/L (ref 98–110)
Creat: 0.88 mg/dL (ref 0.70–1.33)
Glucose, Bld: 129 mg/dL — ABNORMAL HIGH (ref 65–99)
Potassium: 4.8 mmol/L (ref 3.5–5.3)
Sodium: 140 mmol/L (ref 135–146)

## 2020-05-07 ENCOUNTER — Other Ambulatory Visit: Payer: Self-pay | Admitting: Internal Medicine

## 2020-05-08 ENCOUNTER — Encounter: Payer: Self-pay | Admitting: Internal Medicine

## 2020-05-15 ENCOUNTER — Encounter: Payer: Self-pay | Admitting: Internal Medicine

## 2020-05-15 ENCOUNTER — Ambulatory Visit: Payer: BC Managed Care – PPO | Admitting: Internal Medicine

## 2020-05-15 ENCOUNTER — Other Ambulatory Visit: Payer: Self-pay

## 2020-05-15 VITALS — BP 182/100 | HR 59 | Temp 97.8°F | Resp 18 | Ht 73.0 in | Wt 236.5 lb

## 2020-05-15 DIAGNOSIS — E1140 Type 2 diabetes mellitus with diabetic neuropathy, unspecified: Secondary | ICD-10-CM

## 2020-05-15 DIAGNOSIS — I10 Essential (primary) hypertension: Secondary | ICD-10-CM | POA: Diagnosis not present

## 2020-05-15 MED ORDER — GABAPENTIN 300 MG PO CAPS: 300.0000 mg | ORAL_CAPSULE | Freq: Two times a day (BID) | ORAL | 1 refills | Status: DC

## 2020-05-15 MED ORDER — LOSARTAN POTASSIUM-HCTZ 100-25 MG PO TABS: 1.0000 | ORAL_TABLET | Freq: Every day | ORAL | 1 refills | Status: DC

## 2020-05-15 NOTE — Patient Instructions (Addendum)
Happy belated [place] Ouch and Happy Holidays!   Per our records you are due for an eye exam. Please contact your eye doctor to schedule an appointment. Please have them send copies of your office visit notes to [place]. Our fax number is [phone].  Stop valsartan  Start losartan HCT 100-25 mg:  1 tablet every morning  This new medication may trigger gout, for 1 week take colchicine twice daily  Continue checking your blood pressure, twice a week. BP GOAL is between 110/65 and  135/85. If it is consistently higher or lower, let me know   GO TO THE FRONT DESK, PLEASE SCHEDULE YOUR APPOINTMENTS Come back for   Blood work in 2 weeks   Come back for a check up in 3 months      To find a good quality blood pressure cuff:  www.validatebp.org

## 2020-05-15 NOTE — Progress Notes (Signed)
Subjective:    Patient ID: Barry Bush, male    DOB: Jul 22, 1960, 59 y.o.   MRN: [id number]  DOS:  05/15/2020 Type of visit - description: rov Today with evaluated  hypertension, DM, neuropathy. Neuropathy symptoms increased, he self increase gabapentin dose.   BP Readings from Last 3 Encounters:  05/15/20 (!) 182/100  02/12/20 (!) 160/95  01/28/20 136/72     Review of Systems Denies chest pain or difficulty breathing. No cough, no headache  Past Medical History:  Diagnosis Date  . Acute serous otitis media of left ear 10/2014   Dr. Verne Spurr  . Diabetes mellitus 08-2010  . Esophageal stricture 2003   s/p dilation  . Eustachian tube disorder 10/2014   Dr. Verne Spurr  . GERD (gastroesophageal reflux disease)   . Gout   . Hypertension   . Psoriasis    sees derm  . PSORIASIS 10/16/2006   Qualifier: Diagnosis of  By: Shary Decamp      Past Surgical History:  Procedure Laterality Date  . Facial Trauma      Allergies as of 05/15/2020   No Known Allergies     Medication List       Accurate as of May 15, 2020 11:59 PM. If you have any questions, ask your nurse or doctor.        STOP taking these medications   valsartan 320 MG tablet Commonly known as: DIOVAN Stopped by: Willow Ora, MD     TAKE these medications   allopurinol 100 MG tablet Commonly known as: ZYLOPRIM Take 1 tablet (100 mg total) by mouth daily.   amLODipine 10 MG tablet Commonly known as: NORVASC Take 1 tablet (10 mg total) by mouth daily.   aspirin 81 MG tablet Take 81 mg by mouth daily.   atorvastatin 10 MG tablet Commonly known as: LIPITOR Take 1 tablet (10 mg total) by mouth daily.   fluticasone 50 MCG/ACT nasal spray Commonly known as: FLONASE Place 2 sprays into both nostrils daily.   gabapentin 300 MG capsule Commonly known as: NEURONTIN Take 1 capsule (300 mg total) by mouth 2 (two) times daily. What changed: when to take this Changed by: Willow Ora, MD    loratadine 10 MG tablet Commonly known as: CLARITIN TAKE 1 TABLET BY MOUTH DAILY.   losartan-hydrochlorothiazide 100-25 MG tablet Commonly known as: HYZAAR Take 1 tablet by mouth daily. Started by: Willow Ora, MD   metFORMIN 850 MG tablet Commonly known as: GLUCOPHAGE TAKE 2 TABLETS BY MOUTH WITH BREAKFAST AND 1 TABLET BY MOUTH WITH DINNER.   metoprolol tartrate 100 MG tablet Commonly known as: LOPRESSOR Take 1 tablet (100 mg total) by mouth 2 (two) times daily.   omeprazole 20 MG capsule Commonly known as: PRILOSEC Take 20 mg by mouth daily.   pioglitazone 30 MG tablet Commonly known as: ACTOS Take 1 tablet (30 mg total) by mouth daily.   sildenafil 20 MG tablet Commonly known as: REVATIO Take 4-5 tablets (80-100 mg total) by mouth at bedtime as needed.          Objective:   Physical Exam BP (!) 182/100 (BP Location: Left Arm, Patient Position: Sitting, Cuff Size: Normal)   Pulse (!) 59   Temp 97.8 F (36.6 C) (Oral)   Resp 18   Ht 6\' 1"  (1.854 m)   Wt 236 lb 8 oz (107.3 kg)   SpO2 98%   BMI 31.20 kg/m  General:   Well developed, NAD, BMI noted. HEENT:  Normocephalic .  Face symmetric, atraumatic Lungs:  CTA B Normal respiratory effort, no intercostal retractions, no accessory muscle use. Heart: RRR,  no murmur.  Lower extremities: no pretibial edema bilaterally  Skin: Not pale. Not jaundice Neurologic:  alert & oriented X3.  Speech normal, gait appropriate for age and unassisted Psych--  Cognition and judgment appear intact.  Cooperative with normal attention span and concentration.  Behavior appropriate. No anxious or depressed appearing.      Assessment    Assessment DM  dx 2012 Mild neuropathy dx 10-2015 HTN GERD Esophageal stricture, dilatation 2003 Gout Psoriasis --- sees dermatology, on  otezla  Eustachian  tube disorder, saw ENT 2016 B12 deficiency DX 02-2019  PLAN: DM: Last A1c 6.5, continue Metformin, Actos. Neuropathy: Sxs  increased from nocturnal to morning at night, self increase gabapentin to B.I.D. with good results, Rx sent. HTN: See last visit, losartan switched to valsartan,  subsequent BMP satisfactory.  Also takes metoprolol 100 mg BID, amlodipine 10 mg daily.   Has not taken his medications today. Amb BPs (few readings): 180s.Upon arrival BP was 173/118, re check: 182/100. Not taking NSAIDs, admits to snore sometimes (Epworth: 3, neg) Plan:  DC valsartan, go back on losartan plus HCTZ, BMP in 2 weeks, monitor BPs, see AVS High cholesterol: On Lipitor, last LDL 70 Preventive care: Had a covid vaxbooster, had s/e x  24-hour.  Feeling better RTC labs 2 weeks RTC OV 3 months       This visit occurred during the SARS-CoV-2 public health emergency.  Safety protocols were in place, including screening questions prior to the visit, additional usage of staff PPE, and extensive cleaning of exam room while observing appropriate contact time as indicated for disinfecting solutions.

## 2020-05-15 NOTE — Progress Notes (Signed)
Pre visit review using our clinic review tool, if applicable. No additional management support is needed unless otherwise documented below in the visit note. 

## 2020-05-16 NOTE — Assessment & Plan Note (Signed)
DM: Last A1c 6.5, continue Metformin, Actos. Neuropathy: Sxs increased from nocturnal to morning at night, self increase gabapentin to B.I.D. with good results, Rx sent. HTN: See last visit, losartan switched to valsartan,  subsequent BMP satisfactory.  Also takes metoprolol 100 mg BID, amlodipine 10 mg daily.   Has not taken his medications today. Amb BPs (few readings): 180s.Upon arrival BP was 173/118, re check: 182/100. Not taking NSAIDs, admits to snore sometimes (Epworth: 3, neg) Plan:  DC valsartan, go back on losartan plus HCTZ, BMP in 2 weeks, monitor BPs, see AVS High cholesterol: On Lipitor, last LDL 70 Preventive care: Had a covid vaxbooster, had s/e x  24-hour.  Feeling better RTC labs 2 weeks RTC OV 3 months

## 2020-05-29 ENCOUNTER — Other Ambulatory Visit: Payer: Self-pay

## 2020-05-29 ENCOUNTER — Other Ambulatory Visit (INDEPENDENT_AMBULATORY_CARE_PROVIDER_SITE_OTHER): Payer: BC Managed Care – PPO

## 2020-05-29 DIAGNOSIS — I10 Essential (primary) hypertension: Secondary | ICD-10-CM

## 2020-05-29 LAB — BASIC METABOLIC PANEL
BUN: 20 mg/dL (ref 6–23)
CO2: 30 mEq/L (ref 19–32)
Calcium: 9.3 mg/dL (ref 8.4–10.5)
Chloride: 100 mEq/L (ref 96–112)
Creatinine, Ser: 0.89 mg/dL (ref 0.40–1.50)
GFR: 94.77 mL/min (ref >60.00)
Glucose, Bld: 125 mg/dL — ABNORMAL HIGH (ref 70–99)
Potassium: 4.9 mEq/L (ref 3.5–5.1)
Sodium: 138 mEq/L (ref 135–145)

## 2020-06-04 DIAGNOSIS — E119 Type 2 diabetes mellitus without complications: Secondary | ICD-10-CM | POA: Diagnosis not present

## 2020-06-04 LAB — HM DIABETES EYE EXAM

## 2020-06-05 ENCOUNTER — Other Ambulatory Visit: Payer: Self-pay | Admitting: Internal Medicine

## 2020-06-06 ENCOUNTER — Other Ambulatory Visit: Payer: Self-pay | Admitting: Internal Medicine

## 2020-06-25 ENCOUNTER — Other Ambulatory Visit: Payer: Self-pay | Admitting: Internal Medicine

## 2020-07-10 ENCOUNTER — Other Ambulatory Visit: Payer: Self-pay | Admitting: Internal Medicine

## 2020-08-20 ENCOUNTER — Encounter: Payer: Self-pay | Admitting: Internal Medicine

## 2020-08-20 ENCOUNTER — Ambulatory Visit: Payer: BC Managed Care – PPO | Admitting: Internal Medicine

## 2020-08-20 ENCOUNTER — Other Ambulatory Visit: Payer: Self-pay

## 2020-08-20 VITALS — BP 144/82 | HR 64 | Temp 98.4°F | Resp 16 | Ht 73.0 in | Wt 235.0 lb

## 2020-08-20 DIAGNOSIS — M109 Gout, unspecified: Secondary | ICD-10-CM | POA: Diagnosis not present

## 2020-08-20 DIAGNOSIS — G629 Polyneuropathy, unspecified: Secondary | ICD-10-CM

## 2020-08-20 DIAGNOSIS — E538 Deficiency of other specified B group vitamins: Secondary | ICD-10-CM

## 2020-08-20 DIAGNOSIS — E1140 Type 2 diabetes mellitus with diabetic neuropathy, unspecified: Secondary | ICD-10-CM | POA: Diagnosis not present

## 2020-08-20 LAB — B12 AND FOLATE PANEL
Folate: 16.7 ng/mL (ref >5.9)
Vitamin B-12: 400 pg/mL (ref 211–911)

## 2020-08-20 LAB — HEMOGLOBIN A1C: Hgb A1c MFr Bld: 6.9 % — ABNORMAL HIGH (ref 4.6–6.5)

## 2020-08-20 LAB — URIC ACID: Uric Acid, Serum: 8 mg/dL — ABNORMAL HIGH (ref 4.0–7.8)

## 2020-08-20 LAB — TSH: TSH: 2.62 u[IU]/mL (ref 0.35–4.50)

## 2020-08-20 MED ORDER — FREESTYLE LITE W/DEVICE KIT: 1.0000 | PACK | 0 refills | Status: AC

## 2020-08-20 MED ORDER — GABAPENTIN 600 MG PO TABS: 600.0000 mg | ORAL_TABLET | Freq: Two times a day (BID) | ORAL | 6 refills | Status: DC

## 2020-08-20 MED ORDER — FREESTYLE LITE TEST VI STRP: ORAL_STRIP | 12 refills | Status: AC

## 2020-08-20 MED ORDER — FREESTYLE LANCETS MISC: 12 refills | Status: AC

## 2020-08-20 NOTE — Progress Notes (Signed)
Subjective:    Patient ID: Barry Bush, male    DOB: May 28, 1961, 60 y.o.   MRN: [id number]  DOS:  08/20/2020 Type of visit - description: f/u In general feels well except neuropathy symptoms are more noticeable, not only at his feet but also at the tip of his fingers. Again describes burning feeling at the bottom of the feet. Denies back or neck pain.  We reviewed his ambulatory BPs, CBGs.   Wt Readings from Last 3 Encounters:  08/20/20 235 lb (106.6 kg)  05/15/20 236 lb 8 oz (107.3 kg)  02/12/20 237 lb 2 oz (107.6 kg)    Review of Systems See above   Past Medical History:  Diagnosis Date  . Acute serous otitis media of left ear 10/2014   Dr. Verne Spurr  . Diabetes mellitus 08-2010  . Esophageal stricture 2003   s/p dilation  . Eustachian tube disorder 10/2014   Dr. Verne Spurr  . GERD (gastroesophageal reflux disease)   . Gout   . Hypertension   . Psoriasis    sees derm  . PSORIASIS 10/16/2006   Qualifier: Diagnosis of  By: Shary Decamp      Past Surgical History:  Procedure Laterality Date  . Facial Trauma      Allergies as of 08/20/2020   No Known Allergies     Medication List       Accurate as of August 20, 2020  8:31 AM. If you have any questions, ask your nurse or doctor.        allopurinol 100 MG tablet Commonly known as: ZYLOPRIM Take 1 tablet (100 mg total) by mouth daily.   amLODipine 10 MG tablet Commonly known as: NORVASC Take 1 tablet (10 mg total) by mouth daily.   aspirin 81 MG tablet Take 81 mg by mouth daily.   atorvastatin 10 MG tablet Commonly known as: LIPITOR Take 1 tablet (10 mg total) by mouth daily.   fluticasone 50 MCG/ACT nasal spray Commonly known as: FLONASE Place 2 sprays into both nostrils daily.   gabapentin 300 MG capsule Commonly known as: NEURONTIN Take 1 capsule (300 mg total) by mouth 2 (two) times daily.   loratadine 10 MG tablet Commonly known as: CLARITIN TAKE 1 TABLET BY MOUTH DAILY.    losartan-hydrochlorothiazide 100-25 MG tablet Commonly known as: HYZAAR Take 1 tablet by mouth daily.   metFORMIN 850 MG tablet Commonly known as: GLUCOPHAGE TAKE 2 TABLETS BY MOUTH WITH BREAKFAST AND 1 TABLET BY MOUTH WITH DINNER.   metoprolol tartrate 100 MG tablet Commonly known as: LOPRESSOR Take 1 tablet (100 mg total) by mouth 2 (two) times daily.   omeprazole 20 MG capsule Commonly known as: PRILOSEC Take 20 mg by mouth daily.   pioglitazone 30 MG tablet Commonly known as: ACTOS Take 1 tablet (30 mg total) by mouth daily.   sildenafil 20 MG tablet Commonly known as: REVATIO Take 4-5 tablets (80-100 mg total) by mouth at bedtime as needed.          Objective:   Physical Exam BP (!) 144/82 (BP Location: Left Arm, Patient Position: Sitting, Cuff Size: Normal)   Pulse 64   Temp 98.4 F (36.9 C) (Oral)   Resp 16   Ht 6\' 1"  (1.854 m)   Wt 235 lb (106.6 kg)   SpO2 98%   BMI 31.00 kg/m  General:   Well developed, NAD, BMI noted. HEENT:  Normocephalic . Face symmetric, atraumatic Lungs:  CTA B Normal respiratory effort, no intercostal  retractions, no accessory muscle use. Heart: RRR,  no murmur.  Lower extremities: no pretibial edema bilaterally  Skin: Not pale. Not jaundice Neurologic:  alert & oriented X3.  Speech normal, gait appropriate for age and unassisted Psych--  Cognition and judgment appear intact.  Cooperative with normal attention span and concentration.  Behavior appropriate. No anxious or depressed appearing.      Assessment     Assessment DM  dx 2012 Mild neuropathy dx 10-2015 HTN GERD Esophageal stricture, dilatation 2003 Gout Psoriasis --- sees dermatology, on  otezla  Eustachian  tube disorder, saw ENT 2016 B12 deficiency DX 02-2019  PLAN: DM: Continue Metformin, Actos.  Ambulatory CBGs in the morning in the 120s in the afternoon 130-160s.  Check A1c. HTN: On amlodipine, Hyzaar, metoprolol.  Ambulatory BPs are mostly in the  120s, 130s with diastolic BP in the 80s and occasionally low 90s.  No change for now, last BMP satisfactory.   Neuropathy: sxs increasing, likely D/T DM, B12 deficiency,  to be complete we will check ANA, TSH and refer to neurology symptoms are progressing.  Increase gabapentin to 600 mg twice daily. Gout: On allopurinol, check a U acid B12 deficiency: On OTC supplements, checking levels. RTC 4 months    This visit occurred during the SARS-CoV-2 public health emergency.  Safety protocols were in place, including screening questions prior to the visit, additional usage of staff PPE, and extensive cleaning of exam room while observing appropriate contact time as indicated for disinfecting solutions.

## 2020-08-20 NOTE — Progress Notes (Signed)
Pre visit review using our clinic review tool, if applicable. No additional management support is needed unless otherwise documented below in the visit note. 

## 2020-08-20 NOTE — Patient Instructions (Addendum)
Per our records you are due for an eye exam. Please contact your eye doctor to schedule an appointment. Please have them send copies of your office visit notes to [place]. Our fax number is [phone].   Continue checking your blood pressures as you are doing BP GOAL is between 110/65 and  135/85. If it is consistently higher or lower, let me know  Increase gabapentin to 600 mg twice daily, I sent a prescription We are referring you to a neurologist  GO TO THE LAB : Get the blood work     GO TO THE FRONT DESK, PLEASE SCHEDULE YOUR APPOINTMENTS Come back for a checkup in 4 months

## 2020-08-23 NOTE — Assessment & Plan Note (Signed)
DM: Continue Metformin, Actos.  Ambulatory CBGs in the morning in the 120s in the afternoon 130-160s.  Check A1c. HTN: On amlodipine, Hyzaar, metoprolol.  Ambulatory BPs are mostly in the 120s, 130s with diastolic BP in the 80s and occasionally low 90s.  No change for now, last BMP satisfactory.   Neuropathy: sxs increasing, likely D/T DM, B12 deficiency,  to be complete we will check ANA, TSH and refer to neurology symptoms are progressing.  Increase gabapentin to 600 mg twice daily. Gout: On allopurinol, check a U acid B12 deficiency: On OTC supplements, checking levels. RTC 4 months

## 2020-08-24 ENCOUNTER — Other Ambulatory Visit: Payer: Self-pay | Admitting: Internal Medicine

## 2020-08-24 LAB — ANTI-NUCLEAR AB-TITER (ANA TITER): ANA Titer 1: 1:80 {titer} — ABNORMAL HIGH

## 2020-08-24 LAB — ANA: Anti Nuclear Antibody (ANA): POSITIVE — AB

## 2020-08-24 NOTE — Telephone Encounter (Signed)
Yes, okay to refill, see results

## 2020-08-24 NOTE — Telephone Encounter (Signed)
Refill request for Actos, lab work completed on 08/20/2020. A1c higher at 6.9. Okay to refill?

## 2020-08-27 ENCOUNTER — Ambulatory Visit: Payer: BC Managed Care – PPO | Admitting: Neurology

## 2020-08-27 ENCOUNTER — Encounter: Payer: Self-pay | Admitting: Neurology

## 2020-08-27 ENCOUNTER — Other Ambulatory Visit: Payer: Self-pay

## 2020-08-27 VITALS — BP 137/85 | HR 61 | Ht 73.0 in | Wt 235.0 lb

## 2020-08-27 DIAGNOSIS — G8929 Other chronic pain: Secondary | ICD-10-CM

## 2020-08-27 DIAGNOSIS — R200 Anesthesia of skin: Secondary | ICD-10-CM

## 2020-08-27 DIAGNOSIS — E1140 Type 2 diabetes mellitus with diabetic neuropathy, unspecified: Secondary | ICD-10-CM | POA: Diagnosis not present

## 2020-08-27 DIAGNOSIS — M25561 Pain in right knee: Secondary | ICD-10-CM | POA: Diagnosis not present

## 2020-08-27 DIAGNOSIS — L409 Psoriasis, unspecified: Secondary | ICD-10-CM

## 2020-08-27 DIAGNOSIS — R768 Other specified abnormal immunological findings in serum: Secondary | ICD-10-CM

## 2020-08-27 DIAGNOSIS — R202 Paresthesia of skin: Secondary | ICD-10-CM | POA: Diagnosis not present

## 2020-08-27 DIAGNOSIS — Z79899 Other long term (current) drug therapy: Secondary | ICD-10-CM | POA: Diagnosis not present

## 2020-08-27 DIAGNOSIS — M25562 Pain in left knee: Secondary | ICD-10-CM

## 2020-08-27 NOTE — Progress Notes (Signed)
Subjective:    Patient ID: Barry Bush is a 60 y.o. male.  HPI     Star Age, MD, PhD [facility] [address], [address] [place], [place] [address]  Dear Dr. Larose Kells,   I saw your patient, Barry Bush, upon your kind request, in my Neurologic clinic today for initial consultation of his paresthesias, concern for neuropathy. The patient is unaccompanied today.  As you know, [name] is a 60 year old right-handed gentleman with an underlying medical history of hypertension, reflux disease, gout, psoriasis, diabetes and obesity, who reports an approximately 6 to 7-year history of tingling which started in his feet, primarily in the big toes.  He has had numbness and tingling as well as stinging sensation and sometimes burning feeling in his feet, it seems to be confined to the toes and balls of the feet but sometimes he has some discomfort around the right ankle.  He has had worsening of his psoriasis.  Last year he went to dermatology but he was discouraged from trying new medication because of the pandemic and the potential for lowered immune system with these medications.  In the past he was on [place] but his insurance stopped paying for it.  He also reports discomfort in both knees.  He does not have any pain in any other major joints.  He is on his feet all day.  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  In the past 2 years he has noticed more cold symptoms in his feet and he sleeps with his socks on.  He has recently noticed some tingling in his fingertips particularly in the left hand in the last 2 fingers.  He has not fallen.  He has good sugar control typically and his A1c has never been out of control, has been diabetic for at least 12 years as he recalls.  No family history of neuropathy.  Mom has diabetes, she also complains of cold feet, and dad also had diabetes.  I reviewed your office note from 08/20/2020.  He had blood work through your  office on 08/20/2020 and I was able to review the results in his chart: Hemoglobin A1c was 6.9, TSH 2.62, B12 400, folate 16.7, uric acid elevated at 8.0, ANA positive with a titer of 1:80. He is a non-smoker and drinks alcohol only occasionally, never had a history of heavy drinking.  He lives with his wife, he sleeps well, he has some occasional snoring but has never been told he has apneas or has had symptoms of choking.  He and his wife sleep in separate beds.  He tries to get at least 7 or 8 hours of sleep on an average night and feels reasonably well rested, nocturia once per average night.  He has yearly eye examinations, last exam was November 2021 and he was told that his prescription was about the same and he continued to use his old glasses. He has been on gabapentin 300 mg twice daily and recently this was increased to 600 mg twice daily, he picked up a prescription this week.  Thus far, he has not been able to really tell a difference.  His Past Medical History Is Significant For: Past Medical History:  Diagnosis Date  . Acute serous otitis media of left ear 10/2014   Dr. Ilda Foil  . Diabetes mellitus 08-2010  . Esophageal stricture 2003   s/p dilation  . Eustachian tube disorder 10/2014   Dr. Ilda Foil  .  GERD (gastroesophageal reflux disease)   . Gout   . Hypertension   . Psoriasis    sees derm  . PSORIASIS 10/16/2006   Qualifier: Diagnosis of  By: Dawson Bills      His Past Surgical History Is Significant For: Past Surgical History:  Procedure Laterality Date  . Facial Trauma      His Family History Is Significant For: Family History  Problem Relation Age of Onset  . Diabetes Father   . Heart attack Father        2 stents 2012, onset at age 4  . Diabetes Mother   . Lung cancer Other        GM  . Melanoma Other        GF  . Colon cancer Neg Hx   . Prostate cancer Neg Hx     His Social History Is Significant For: Social History   Socioeconomic History  .  Marital status: Married    Spouse name: Not on file  . Number of children: 1  . Years of education: Not on file  . Highest education level: Not on file  Occupational History  . 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 Smoking status: Never Smoker  . Smokeless tobacco: Never Used  Substance and Sexual Activity  . Alcohol use: Yes    Comment: 3 days a week  . Drug use: No  . Sexual activity: Not on file  Other Topics Concern  . Not on file  Social History Narrative   Household- pt and wife   Social Determinants of Radio broadcast assistant Strain: Not on file  Food Insecurity: Not on file  Transportation Needs: Not on file  Physical Activity: Not on file  Stress: Not on file  Social Connections: Not on file    His Allergies Are:  No Known Allergies:   His Current Medications Are:  Outpatient Encounter Medications as of 08/27/2020  Medication Sig  . allopurinol (ZYLOPRIM) 100 MG tablet Take 1 tablet (100 mg total) by mouth daily.  [name] amLODipine (NORVASC) 10 MG tablet Take 1 tablet (10 mg total) by mouth daily.  [name] aspirin 81 MG tablet Take 81 mg by mouth daily.  [name] atorvastatin (LIPITOR) 10 MG tablet Take 1 tablet (10 mg total) by mouth daily.  . Blood Glucose Monitoring Suppl (FREESTYLE LITE) w/Device KIT 1 Device by Does not apply route as directed. Check blood sugars twice daily  . fluticasone (FLONASE) 50 MCG/ACT nasal spray Place 2 sprays into both nostrils daily.  [name] gabapentin (NEURONTIN) 600 MG tablet Take 1 tablet (600 mg total) by mouth 2 (two) times daily.  [name] glucose blood (FREESTYLE LITE) test strip Check blood sugars twice daily  . Lancets (FREESTYLE) lancets Check blood sugars twice daily  . loratadine (CLARITIN) 10 MG tablet TAKE 1 TABLET BY MOUTH DAILY.  [name] losartan-hydrochlorothiazide (HYZAAR) 100-25 MG tablet Take 1 tablet by mouth daily.  . metFORMIN (GLUCOPHAGE) 850 MG tablet TAKE 2 TABLETS BY MOUTH WITH BREAKFAST  AND 1 TABLET BY MOUTH WITH DINNER.  . metoprolol tartrate (LOPRESSOR) 100 MG tablet Take 1 tablet (100 mg total) by mouth 2 (two) times daily.  [name] omeprazole (PRILOSEC) 20 MG capsule Take 20 mg by mouth daily.  . pioglitazone (ACTOS) 30 MG tablet Take 1 tablet (30 mg total) by mouth daily.  . sildenafil (REVATIO) 20 MG tablet Take 4-5 tablets (80-100 mg total) by mouth at bedtime  as needed.   No facility-administered encounter medications on file as of 08/27/2020.  :   Review of Systems:  Out of a complete 14 point review of systems, all are reviewed and negative with the exception of these symptoms as listed below:   Review of Systems  Neurological:       Here for consult on worsening neuropathy. Pt is currently on Gabapentin 600 mg BID.    Objective:  Neurological Exam  Physical Exam Physical Examination:   Vitals:   08/27/20 0801  BP: 137/85  Pulse: 61   General Examination: The patient is a very pleasant 60 y.o. male in no acute distress. He appears well-developed and well-nourished and well groomed.   HEENT: Normocephalic, atraumatic, pupils are equal, round and reactive to light and accommodation.  Corrective eyeglasses in place.  Funduscopic exam is normal with sharp disc margins noted. Extraocular tracking is good without limitation to gaze excursion or nystagmus noted. Normal smooth pursuit is noted. Hearing is grossly intact. Face is symmetric with normal facial animation and normal facial sensation. Speech is clear with no dysarthria noted. There is no hypophonia. There is no lip, neck/head, jaw or voice tremor. Neck is supple with full range of passive and active motion. There are no carotid bruits on auscultation. Oropharynx exam reveals: mild mouth dryness, adequate dental hygiene. Tongue protrudes centrally and palate elevates symmetrically.   Chest: Clear to auscultation without wheezing, rhonchi or crackles noted.  Heart: S1+S2+0, regular and normal without murmurs,  rubs or gallops noted.   Abdomen: Soft, non-tender and non-distended with normal bowel sounds appreciated on auscultation.  Extremities: There is no pitting edema in the distal lower extremities bilaterally. Pedal pulses are intact.  Skin: Warm and dry but with prominent psoriatic patches above knees bilaterally, also elbows and into the forearm areas bilaterally.  No varicose veins.  Musculoskeletal: exam reveals no significant joint swelling but does report bilateral knee discomfort.  No significant decrease in range of motion.   Neurologically:  Mental status: The patient is awake, alert and oriented in all 4 spheres. His immediate and remote memory, attention, language skills and fund of knowledge are appropriate. There is no evidence of aphasia, agnosia, apraxia or anomia. Speech is clear with normal prosody and enunciation. Thought process is linear. Mood is normal and affect is normal.  Cranial nerves II - XII are as described above under HEENT exam. In addition: shoulder shrug is normal with equal shoulder height noted. Motor exam: Normal bulk, strength and tone is noted. There is no drift, tremor or rebound. Romberg is negative. Reflexes are 1-2+ in the upper extremities, 2+ in both knees, trace or absent in both ankles. Babinski: Toes are flexor bilaterally. Fine motor skills and coordination: intact with normal finger taps, normal hand movements, normal rapid alternating patting, normal foot taps and normal foot agility.  Cerebellar testing: No dysmetria or intention tremor on finger to nose testing. Heel to shin is unremarkable bilaterally. There is no truncal or gait ataxia.  Sensory exam: intact to light touch, pinprick, vibration, temperature sense in the upper extremities with decreased sensation to pinprick and vibration sense in the distal lower extremities, vibration sense is reduced in the toes bilaterally and pinprick reduced up to almost ankle bilaterally.  Gait, station and  balance: He stands easily. No veering to one side is noted. No leaning to one side is noted. Posture is age-appropriate and stance is narrow based. Gait shows normal stride length and normal pace. No problems  turning are noted.  Tandem walk is slightly challenging in the beginning but doable well after few insecure steps.    Assessment and Plan:   In summary, EL PILE is a very pleasant 60 y.o.-year old male with an underlying medical history of hypertension, reflux disease, gout, psoriasis, diabetes and obesity, who presents for evaluation of his paresthesias affecting both feet as well as dysesthesias including stinging and burning sensation and numbness.  History and examination are concerning for neuropathy, likely diabetic neuropathy given his history.  We talked about this condition, its prognosis and treatment options.  We talked about his symptomatic treatment with gabapentin.  It was recently increased from 300 mg twice daily to 600 mg twice daily.  He is encouraged to continue with this dose.  He had blood work through your office on 08/20/2020 and had a positive ANA, A1c was below 7, uric acid a little elevated.  He is advised to talk to about the positive ANA and if you would recommend any additional work-up or referral to rheumatology.  There is no telltale evidence that he has psoriatic arthritis but he reports bilateral knee pain, he may benefit from evaluation for this. Of note, his psoriasis has become worse as well.  He has seen dermatology for this and is encouraged to seek reevaluation.  There may be options for him since the pandemic is hopefully less roaring and he is fully vaccinated.  From my end of things I recommend some additional blood work today, we will check SPEP and additional B vitamins.   We will also proceed with an EMG nerve conduction velocity test through our office and call him with his results.   He is advised to follow-up routinely in this office in 3 to 4  months, sooner if needed and we will inform him of his test results by phone call in the interim.   I answered all his questions today and the patient was in agreement with the above outlined plan.  Thank you very much for allowing me to participate in the care of this nice patient. If I can be of any further assistance to you please do not hesitate to call me at [phone].  Sincerely,   Star Age, MD, PhD

## 2020-08-27 NOTE — Patient Instructions (Signed)
It was nice to meet you today.  When Dr. Drue Novel has checked your blood work, you had a positive ANA, this is a nonspecific auto antibody.  Please check with him if he would like for you to do additional testing with blood work or see a rheumatologist since you also have bilateral knee pain.  I do believe it would be helpful if you saw your dermatologist for your psoriasis again.  Perhaps the ANA is positive because of your psoriasis even again, it is a nonspecific marker.  Your symptoms and exam do suggest neuropathy, likely diabetic, i. e. nerve damage. Unfortunately, as I mentioned, there is no specific treatment for most neuropathies. The most common cause for neuropathy is diabetes in this [place], in which case, tight glucose control is key.  Some studies suggest that obesity and prediabetes can also cause nerve damage even in the absence of a formal diagnosis of diabetes.  Unfortunately, even with good diabetes control, some patients go on to have neuropathy.  Other causes include thyroid disease, and some vitamin deficiencies. Certain medications such as chemotherapy agents and other chemicals or toxins including alcohol can cause neuropathy. There are some genetic conditions or hereditary neuropathies. Typically patients will report a family history of neuropathy in those conditions. There are cases associated with cancers and autoimmune conditions. Most neuropathies are progressive unless a root cause can be found and treated, which is rare, as I explained. For most neuropathies there is no actual cure or reversing of symptoms.   Please continue with the gabapentin as prescribed by your primary care physician.  Electrophysiologic testing with nerve conduction velocity studies and EMG (muscle testing) do not always pick up neuropathies that affect the smallest fibers. Other common tests include different type of blood work, and rarely, spinal fluid testing, and sometimes we resort to asking for a  nerve and muscle biopsy.  We can also consider specialist input from a neuromuscular specialist, sometimes we make referrals to [facility] or [facility] or [facility].  For now, as discussed, we will proceed with further work-up from my end of things: We will check blood work today and call you with the test results.  We will do an EMG and nerve conduction velocity test, which is an electrical nerve and muscle test, which we will schedule. We will call you with the results.  Please follow-up routinely in this office in about 3 to 4 months.

## 2020-09-01 ENCOUNTER — Telehealth: Payer: Self-pay

## 2020-09-01 NOTE — Telephone Encounter (Signed)
Pt called and advised of results. He verbalized understanding and understands we will call back if Vitamin B1 and B6 come back abnormal.

## 2020-09-01 NOTE — Telephone Encounter (Signed)
-----   Message from Huston Foley, MD sent at 09/01/2020  7:24 AM EST ----- Please call and advise the patient that the recent labs we checked were within normal limits. 2 tests are pending, namely the vitamin B1 and B6, which usually take a few days longer; we will call/update if abnormal.  Thanks,  Huston Foley, MD, PhD

## 2020-09-01 NOTE — Progress Notes (Signed)
Please call and advise the patient that the recent labs we checked were within normal limits. 2 tests are pending, namely the vitamin B1 and B6, which usually take a few days longer; we will call/update if abnormal.  Thanks,  Huston Foley, MD, PhD

## 2020-09-07 LAB — MULTIPLE MYELOMA PANEL, SERUM
Albumin SerPl Elph-Mcnc: 3.9 g/dL (ref 2.9–4.4)
Albumin/Glob SerPl: 1.4 (ref 0.7–1.7)
Alpha 1: 0.2 g/dL (ref 0.0–0.4)
Alpha2 Glob SerPl Elph-Mcnc: 0.9 g/dL (ref 0.4–1.0)
B-Globulin SerPl Elph-Mcnc: 1.2 g/dL (ref 0.7–1.3)
Gamma Glob SerPl Elph-Mcnc: 0.7 g/dL (ref 0.4–1.8)
Globulin, Total: 2.9 g/dL (ref 2.2–3.9)
IgA/Immunoglobulin A, Serum: 233 mg/dL (ref 90–386)
IgG (Immunoglobin G), Serum: 609 mg/dL (ref 603–1613)
IgM (Immunoglobulin M), Srm: 20 mg/dL (ref 20–172)
Total Protein: 6.8 g/dL (ref 6.0–8.5)

## 2020-09-07 LAB — VITAMIN B1: Thiamine: 147.8 nmol/L (ref 66.5–200.0)

## 2020-09-07 LAB — SEDIMENTATION RATE: Sed Rate: 8 mm/hr (ref 0–30)

## 2020-09-07 LAB — VITAMIN B6: Vitamin B6: 19.3 ug/L (ref 5.3–46.7)

## 2020-09-07 LAB — HEAVY METALS PROFILE II, BLOOD
Arsenic: 1 ug/L (ref 0–9)
Cadmium: <0.5 ug/L (ref 0.0–1.2)
Lead, Blood: <1 ug/dL (ref 0–4)
Mercury: <1 ug/L (ref 0.0–14.9)

## 2020-09-07 LAB — RPR: RPR Ser Ql: NONREACTIVE

## 2020-09-07 LAB — VITAMIN D 25 HYDROXY (VIT D DEFICIENCY, FRACTURES): Vit D, 25-Hydroxy: 36.4 ng/mL (ref 30.0–100.0)

## 2020-09-09 ENCOUNTER — Encounter: Payer: BC Managed Care – PPO | Admitting: Neurology

## 2020-09-09 ENCOUNTER — Other Ambulatory Visit: Payer: Self-pay

## 2020-09-09 ENCOUNTER — Ambulatory Visit (INDEPENDENT_AMBULATORY_CARE_PROVIDER_SITE_OTHER): Payer: BC Managed Care – PPO | Admitting: Neurology

## 2020-09-09 DIAGNOSIS — R768 Other specified abnormal immunological findings in serum: Secondary | ICD-10-CM

## 2020-09-09 DIAGNOSIS — R200 Anesthesia of skin: Secondary | ICD-10-CM | POA: Diagnosis not present

## 2020-09-09 DIAGNOSIS — G8929 Other chronic pain: Secondary | ICD-10-CM

## 2020-09-09 DIAGNOSIS — R202 Paresthesia of skin: Secondary | ICD-10-CM | POA: Diagnosis not present

## 2020-09-09 DIAGNOSIS — L409 Psoriasis, unspecified: Secondary | ICD-10-CM

## 2020-09-09 DIAGNOSIS — E1140 Type 2 diabetes mellitus with diabetic neuropathy, unspecified: Secondary | ICD-10-CM

## 2020-09-09 DIAGNOSIS — M25562 Pain in left knee: Secondary | ICD-10-CM

## 2020-09-09 DIAGNOSIS — Z0289 Encounter for other administrative examinations: Secondary | ICD-10-CM

## 2020-09-09 NOTE — Procedures (Addendum)
Full Name: Barry Bush Gender: Male MRN #: [id number] Date of Birth: 09/14/1960    Visit Date: 09/09/2020 07:13 Age: 60 Years Examining Physician: Levert Feinstein, MD  Referring Physician: Huston Foley, MD History: 60 year old male with history of diabetes, presenting with 3 years history of right foot numbness, starting at the plantar surface, mainly involving the ball of right foot, 2 years later, he began to experience similar involvement of left foot, he denies significant pain, denies gait abnormality.  Only recently he began to notice intermittent left fourth and fifth finger numbness.  He had diagnosis of psoriasis, but never received any treatment.  On examination: Bilateral upper and lower extremity motor strength is normal.  Decreased vibratory sensation at bilateral toes, length dependent decreased pinprick to ankle level, trace ankle reflex.  Summary of the tests: Nerve conduction study: Bilateral superficial peroneal sensory responses were absent.  Bilateral sural sensory response showed mildly decreased snap amplitude.  Left ulnar sensory responses were normal.  Bilateral tibial, peroneal to EDB, left ulnar motor responses were normal.  Bilateral tibial F-wave latency was prolonged, but well-formed, likely due to his tall status.  Electromyography: Selected needle examination of bilateral lower extremity muscles were normal.  Conclusion: This is an abnormal study.  There is electrodiagnostic evidence of mild length dependent axonal neuropathy, mainly involving sensory component.    ------------------------------- Levert Feinstein M.D. PhD  [facility] [address], [address] [place], [place] [address] Tel: [phone] Fax: [phone]  Verbal informed consent was obtained from the patient, patient was informed of potential risk of procedure, including bruising, bleeding, hematoma formation, infection, muscle weakness, muscle pain, numbness, among  others.        MNC    Nerve / Sites Muscle Latency Ref. Amplitude Ref. Rel Amp Segments Distance Velocity Ref. Area    ms ms mV mV %  cm m/s m/s mVms  L Ulnar - ADM     Wrist ADM 2.5 ?3.3 9.3 ?6.0 100 Wrist - ADM 7   33.0     B.Elbow ADM 6.1  7.6  82 B.Elbow - Wrist 21 57 ?49 29.3     A.Elbow ADM 8.1  7.0  92.9 A.Elbow - B.Elbow 10 51 ?49 28.7  L Peroneal - EDB     Ankle EDB 4.6 ?6.5 3.0 ?2.0 100 Ankle - EDB 9   7.7     Fib head EDB 11.4  2.2  73.1 Fib head - Ankle 30 44 ?44 7.4     Pop fossa EDB 13.7  2.1  96.1 Pop fossa - Fib head 10 44 ?44 7.3         Pop fossa - Ankle      R Peroneal - EDB     Ankle EDB 4.3 ?6.5 6.8 ?2.0 100 Ankle - EDB 9   18.8     Fib head EDB 10.2  5.6  81.5 Fib head - Ankle 30 51 ?44 16.5     Pop fossa EDB 12.2  6.0  108 Pop fossa - Fib head 10 51 ?44 17.3         Pop fossa - Ankle      L Tibial - AH     Ankle AH 3.9 ?5.8 5.4 ?4.0 100 Ankle - AH 9   12.9     Pop fossa AH 13.0  1.8  32.9 Pop fossa - Ankle 38 42 ?41 7.1  R Tibial - AH     Ankle  AH 3.8 ?5.8 4.3 ?4.0 100 Ankle - AH 9   8.1     Pop fossa AH 13.5  3.1  71.9 Pop fossa - Ankle 40 41 ?41 10.3                     SNC    Nerve / Sites Rec. Site Peak Lat Ref.  Amp Ref. Segments Distance    ms ms V V  cm  L Sural - Ankle (Calf)     Calf Ankle 3.5 ?4.4 3 ?6 Calf - Ankle 14  R Sural - Ankle (Calf)     Calf Ankle 4.3 ?4.4 4 ?6 Calf - Ankle 14  L Superficial peroneal - Ankle     Lat leg Ankle NR ?4.4 NR ?6 Lat leg - Ankle 14  R Superficial peroneal - Ankle     Lat leg Ankle NR ?4.4 NR ?6 Lat leg - Ankle 14  L Ulnar - Orthodromic, (Dig V, Mid palm)     Dig V Wrist 2.5 ?3.1 5 ?5 Dig V - Wrist 51               F  Wave    Nerve F Lat Ref.   ms ms  L Tibial - AH 68.3 ?56.0  L Ulnar - ADM 29.3 ?32.0  R Tibial - AH 69.2 ?56.0           EMG Summary Table    Spontaneous MUAP Recruitment  Muscle IA Fib PSW Fasc Other Amp Dur. Poly Pattern  L. Tibialis anterior Normal None None None _______  Normal Normal Normal Normal  L. Tibialis posterior Normal None None None _______ Normal Normal Normal Normal  L. Peroneus longus Normal None None None _______ Normal Normal Normal Normal  L. Gastrocnemius (Medial head) Normal None None None _______ Normal Normal Normal Normal  L. Vastus lateralis Normal None None None _______ Normal Normal Normal Normal  R. Tibialis anterior Normal None None None _______ Normal Normal Normal Normal  R. Tibialis posterior Normal None None None _______ Normal Normal Normal Normal  R. Peroneus longus Normal None None None _______ Normal Normal Normal Normal  R. Gastrocnemius (Medial head) Normal None None None _______ Normal Normal Normal Normal  R. Vastus lateralis Normal None None None _______ Normal Normal Normal Normal

## 2020-09-10 NOTE — Progress Notes (Signed)
Please call patient and advise him that his recent EMG and nerve conduction velocity test which is the electrical nerve and muscle test showed evidence of neuropathy, mostly mild findings.  As explained, he likely has diabetic neuropathy.  As discussed, he can follow-up in our clinic as planned.

## 2020-09-15 ENCOUNTER — Telehealth: Payer: Self-pay | Admitting: *Deleted

## 2020-09-15 NOTE — Telephone Encounter (Signed)
Spoke with patient and advised him that his recent EMG and nerve conduction velocity test which is the electrical nerve and muscle test showed evidence of neuropathy, mostly mild findings. As explained, he likely has diabetic neuropathy. As discussed, he can follow-up in our clinic as planned. Patient verbalized understanding, appreciation.

## 2020-10-05 DIAGNOSIS — Z79899 Other long term (current) drug therapy: Secondary | ICD-10-CM | POA: Diagnosis not present

## 2020-10-05 DIAGNOSIS — L400 Psoriasis vulgaris: Secondary | ICD-10-CM | POA: Diagnosis not present

## 2020-10-31 ENCOUNTER — Other Ambulatory Visit: Payer: Self-pay | Admitting: Internal Medicine

## 2020-11-05 ENCOUNTER — Other Ambulatory Visit: Payer: Self-pay | Admitting: Internal Medicine

## 2020-11-10 ENCOUNTER — Other Ambulatory Visit: Payer: Self-pay | Admitting: Internal Medicine

## 2020-12-03 ENCOUNTER — Ambulatory Visit: Payer: BC Managed Care – PPO | Admitting: Neurology

## 2020-12-03 ENCOUNTER — Encounter: Payer: Self-pay | Admitting: Neurology

## 2020-12-03 VITALS — BP 124/80 | HR 50 | Ht 73.0 in | Wt 236.0 lb

## 2020-12-03 DIAGNOSIS — E1140 Type 2 diabetes mellitus with diabetic neuropathy, unspecified: Secondary | ICD-10-CM | POA: Diagnosis not present

## 2020-12-03 DIAGNOSIS — G629 Polyneuropathy, unspecified: Secondary | ICD-10-CM

## 2020-12-03 NOTE — Patient Instructions (Signed)
It was nice to see you again today.  Your blood work through our office did not show any additional abnormalities or treatable causes for your neuropathy/nerve damage.  Your electrical nerve and muscle testing called EMG/NCV test through our office showed evidence of mild neuropathy affecting the sensory nerves.  Your exam is stable which is reassuring.  You can continue with gabapentin 800 mg twice daily as instructed by Dr. Drue Novel.  Please follow-up routinely to see one of our nurse practitioners in 1 year.

## 2020-12-03 NOTE — Progress Notes (Signed)
Subjective:    Patient ID: Barry Bush is a 60 y.o. male.  HPI    Interim history:   [name] is a 60 year old right-handed gentleman with an underlying medical history of hypertension, reflux disease, gout, psoriasis, diabetes and obesity, who presents for follow-up consultation of his neuropathy.  The patient is unaccompanied today.  I first met him at the request of his primary care physician on 08/27/2020, at which time he reported a several year history of approximately 6 to 7 years of paresthesias affecting his feet.  He had had recent blood work through his primary care physician.  Examination showed decreased sensation to pinprick and vibration in the distal lower extremities bilaterally.  He was on gabapentin which had been started by his primary care physician and he was on 600 mg twice daily at the time.  A1c has been below 7, he had a positive ANA and was advised to talk to his primary care physician about further evaluation for this.  I suggested we proceed with EMG and nerve conduction velocity testing as well as additional blood work through our office.  He was advised to continue with gabapentin.  He was advised to consider seeing dermatology for his psoriasis which had become worse.   Blood work from 08/27/2020 included multiple myeloma panel, vitamin B1, vitamin B6, RPR, ESR, heavy metal profile, vitamin D.  Labs were benign and he was notified.  He had and EMG/NCV test through our office on 09/09/20 and I reviewed the results:     Conclusion: This is an abnormal study.  There is electrodiagnostic evidence of mild length dependent axonal neuropathy, mainly involving sensory component.  We called him with his test results.   Today, 12/03/20: He reports feeling stable.  His primary care physician increased the gabapentin to 800 mg twice daily.  He reports doing well with that and it is helpful.  He has not had any major side effects from it.  Symptoms are about the same,  he has not had any recent falls.  He did see dermatology in April and is waiting to start Stelara, previously was on [place] with good success but insurance did not cover it any longer.  The patient's allergies, current medications, family history, past medical history, past social history, past surgical history and problem list were reviewed and updated as appropriate.   Previously:   08/27/20: (He) reports an approximately 6 to 7-year history of tingling which started in his feet, primarily in the big toes.  He has had numbness and tingling as well as stinging sensation and sometimes burning feeling in his feet, it seems to be confined to the toes and balls of the feet but sometimes he has some discomfort around the right ankle.  He has had worsening of his psoriasis.  Last year he went to dermatology but he was discouraged from trying new medication because of the pandemic and the potential for lowered immune system with these medications.  In the past he was on [place] but his insurance stopped paying for it.  He also reports discomfort in both knees.  He does not have any pain in any other major joints.  He is on his feet all day.  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  In the past 2 years he has noticed more cold symptoms in his feet and he sleeps with his socks on.  He has recently noticed some tingling in his fingertips particularly in the left hand in  the last 2 fingers.  He has not fallen.  He has good sugar control typically and his A1c has never been out of control, has been diabetic for at least 12 years as he recalls.  No family history of neuropathy.  Mom has diabetes, she also complains of cold feet, and dad also had diabetes.  I reviewed your office note from 08/20/2020.  He had blood work through your office on 08/20/2020 and I was able to review the results in his chart: Hemoglobin A1c was 6.9, TSH 2.62, B12 400, folate 16.7, uric acid elevated at 8.0, ANA positive with a titer of 1:80. He is a  non-smoker and drinks alcohol only occasionally, never had a history of heavy drinking.  He lives with his wife, he sleeps well, he has some occasional snoring but has never been told he has apneas or has had symptoms of choking.  He and his wife sleep in separate beds.  He tries to get at least 7 or 8 hours of sleep on an average night and feels reasonably well rested, nocturia once per average night.  He has yearly eye examinations, last exam was November 2021 and he was told that his prescription was about the same and he continued to use his old glasses. He has been on gabapentin 300 mg twice daily and recently this was increased to 600 mg twice daily, he picked up a prescription this week.  Thus far, he has not been able to really tell a difference.   His Past Medical History Is Significant For: Past Medical History:  Diagnosis Date   Acute serous otitis media of left ear 10/2014   Dr. Ilda Foil   Diabetes mellitus 08-2010   Esophageal stricture 2003   s/p dilation   Eustachian tube disorder 10/2014   Dr. Ilda Foil   GERD (gastroesophageal reflux disease)    Gout    Hypertension    Psoriasis    sees derm   PSORIASIS 10/16/2006   Qualifier: Diagnosis of  By: Dawson Bills      His Past Surgical History Is Significant For: Past Surgical History:  Procedure Laterality Date   Facial Trauma      His Family History Is Significant For: Family History  Problem Relation Age of Onset   Diabetes Father    Heart attack Father        2 stents 2012, onset at age 77   Diabetes Mother    Lung cancer Other        GM   Melanoma Other        GF   Colon cancer Neg Hx    Prostate cancer Neg Hx     His Social History Is Significant For: Social History   Socioeconomic History   Marital status: Married    Spouse name: Not on file   Number of children: 1   Years of education: Not on file   Highest education level: Not on file  Occupational History   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  Tobacco Use   Smoking status: Never   Smokeless tobacco: Never  Vaping Use   Vaping Use: Never used  Substance and Sexual Activity   Alcohol use: Yes    Comment: 3 days a week   Drug use: No   Sexual activity: Not on file  Other Topics Concern   Not on file  Social History Narrative   Household- pt and wife   Right handed  Caffeine: 2 cups/day   Social Determinants of Health   Financial Resource Strain: Not on file  Food Insecurity: Not on file  Transportation Needs: Not on file  Physical Activity: Not on file  Stress: Not on file  Social Connections: Not on file    His Allergies Are:  No Known Allergies:   His Current Medications Are:  Outpatient Encounter Medications as of 12/03/2020  Medication Sig   allopurinol (ZYLOPRIM) 100 MG tablet Take 1 tablet (100 mg total) by mouth daily.   amLODipine (NORVASC) 10 MG tablet Take 1 tablet (10 mg total) by mouth daily.   aspirin 81 MG tablet Take 81 mg by mouth daily.   atorvastatin (LIPITOR) 10 MG tablet Take 1 tablet (10 mg total) by mouth daily.   Blood Glucose Monitoring Suppl (FREESTYLE LITE) w/Device KIT 1 Device by Does not apply route as directed. Check blood sugars twice daily   fluticasone (FLONASE) 50 MCG/ACT nasal spray Place 2 sprays into both nostrils daily.   gabapentin (NEURONTIN) 800 MG tablet Take 800 mg by mouth 2 (two) times daily.   glucose blood (FREESTYLE LITE) test strip Check blood sugars twice daily   hydrochlorothiazide (HYDRODIURIL) 25 MG tablet Take 1 tablet (25 mg total) by mouth daily.   Lancets (FREESTYLE) lancets Check blood sugars twice daily   loratadine (CLARITIN) 10 MG tablet TAKE 1 TABLET BY MOUTH DAILY.   losartan (COZAAR) 100 MG tablet Take 1 tablet (100 mg total) by mouth daily.   metFORMIN (GLUCOPHAGE) 850 MG tablet TAKE 2 TABLETS BY MOUTH WITH BREAKFAST AND 1 TABLET BY MOUTH WITH DINNER.   metoprolol tartrate (LOPRESSOR) 100 MG tablet Take 1 tablet (100 mg  total) by mouth 2 (two) times daily.   omeprazole (PRILOSEC) 20 MG capsule Take 20 mg by mouth daily.   pioglitazone (ACTOS) 30 MG tablet Take 1 tablet (30 mg total) by mouth daily.   sildenafil (REVATIO) 20 MG tablet Take 4-5 tablets (80-100 mg total) by mouth at bedtime as needed.   [DISCONTINUED] gabapentin (NEURONTIN) 600 MG tablet Take 1 tablet (600 mg total) by mouth 2 (two) times daily. (Patient not taking: Reported on 12/03/2020)   No facility-administered encounter medications on file as of 12/03/2020.  :  Review of Systems:  Out of a complete 14 point review of systems, all are reviewed and negative with the exception of these symptoms as listed below:  Review of Systems  Neurological:        Patient here for follow-up of neuropathy. He states his symptoms are about the same. He reports he is tolerating Gabapentin well. He believes he takes 800 mg twice daily now.    Objective:  Neurological Exam  Physical Exam Physical Examination:   Vitals:   12/03/20 0731  BP: 124/80  Pulse: (!) 50    General Examination: The patient is a very pleasant 60 y.o. male in no acute distress. He appears well-developed and well-nourished and well groomed.   HEENT: Normocephalic, atraumatic, pupils are equal, round and reactive to light, corrective eyeglasses in place.  Extraocular tracking is good without limitation to gaze excursion or nystagmus noted. Normal smooth pursuit is noted. Hearing is grossly intact. Face is symmetric with normal facial animation and normal facial sensation. Speech is clear with no dysarthria noted. There is no hypophonia. There is no lip, neck/head, jaw or voice tremor. Neck is supple with full range of passive and active motion. There are no carotid bruits on auscultation.   Chest: Clear to  auscultation without wheezing, rhonchi or crackles noted.   Heart: S1+S2+0, regular and normal without murmurs, rubs or gallops noted.   Abdomen: Soft, non-tender and  non-distended.   Extremities: There is no pitting edema in the distal lower extremities bilaterally. Pedal pulses are intact.   Skin: Warm and dry but with prominent psoriatic patches on both knees and elbows and into the forearm areas bilaterally.    Musculoskeletal: exam reveals no significant joint swelling but does report bilateral knee discomfort and popping noted both knees.   Neurologically: Mental status: The patient is awake, alert and oriented in all 4 spheres. His immediate and remote memory, attention, language skills and fund of knowledge are appropriate. There is no evidence of aphasia, agnosia, apraxia or anomia. Speech is clear with normal prosody and enunciation. Thought process is linear. Mood is normal and affect is normal. Cranial nerves II - XII are as described above under HEENT exam. In addition: shoulder shrug is normal with equal shoulder height noted. Motor exam: Normal bulk, strength and tone is noted. There is no drift, tremor or rebound. Romberg is negative. Reflexes are 1-2+ in the upper extremities, 2+ in both knees, trace or absent in both ankles, all stable. Babinski: Toes are flexor bilaterally. Fine motor skills and coordination: intact with normal finger taps, normal hand movements, normal rapid alternating patting, normal foot taps and normal foot agility. Cerebellar testing: No dysmetria or intention tremor on finger to nose testing. Heel to shin is unremarkable bilaterally. There is no truncal or gait ataxia. Sensory exam: intact to light touch, vibration, temperature sense in the upper extremities with decreased sensation to temp and vibration sense in the distal lower extremities, mostly big toes and forefoot areas. Gait, station and balance: He stands easily. No veering to one side is noted. No leaning to one side is noted. Posture is age-appropriate and stance is narrow based. Gait shows normal stride length and normal pace. No problems turning are noted.   Tandem walk is slightly challenging in the beginning but doable well after few insecure steps.     Assessment and Plan:    In summary, Barry Bush is a very pleasant 60 year old male with an underlying medical history of hypertension, reflux disease, gout, psoriasis, diabetes and obesity, who presents for follow-up consultation of his sensory neuropathy, likely secondary to diabetes, neuropathy was found to be in the mild range by EMG and nerve conduction velocity testing through our office in March 2022.  Extensive blood work did not show any treatable alternative cause of his symptoms.  He has seen dermatology in the interim in April 2022 and is awaiting his new medication start with Stelara.  He is stable in his symptomatology, currently on gabapentin through his primary care physician of 800 mg twice daily.  He is tolerating this and has noted modest results with regards to decrease in dysesthesias.  He has had occasional stinging and burning sensation as well.  Exam is stable which is reassuring.  At this juncture, he is advised to follow-up in this office to see one of our nurse practitioners routinely in 1 year. I answered all his questions today and the patient was in agreement with the plan.  I spent 3 minutes in total face-to-face time and in reviewing records during pre-charting, more than 50% of which was spent in counseling and coordination of care, reviewing test results, reviewing medications and treatment regimen and/or in discussing or reviewing the diagnosis of PN, the prognosis and treatment  options. Pertinent laboratory and imaging test results that were available during this visit with the patient were reviewed by me and considered in my medical decision making (see chart for details).

## 2020-12-09 ENCOUNTER — Other Ambulatory Visit: Payer: Self-pay | Admitting: Internal Medicine

## 2020-12-18 ENCOUNTER — Encounter: Payer: Self-pay | Admitting: Internal Medicine

## 2020-12-18 ENCOUNTER — Other Ambulatory Visit: Payer: Self-pay

## 2020-12-18 ENCOUNTER — Ambulatory Visit: Payer: BC Managed Care – PPO | Admitting: Internal Medicine

## 2020-12-18 VITALS — BP 132/70 | HR 54 | Temp 98.4°F | Resp 16 | Ht 73.0 in | Wt 236.0 lb

## 2020-12-18 DIAGNOSIS — Z7185 Encounter for immunization safety counseling: Secondary | ICD-10-CM

## 2020-12-18 DIAGNOSIS — I10 Essential (primary) hypertension: Secondary | ICD-10-CM

## 2020-12-18 DIAGNOSIS — E1140 Type 2 diabetes mellitus with diabetic neuropathy, unspecified: Secondary | ICD-10-CM | POA: Diagnosis not present

## 2020-12-18 DIAGNOSIS — Z125 Encounter for screening for malignant neoplasm of prostate: Secondary | ICD-10-CM | POA: Diagnosis not present

## 2020-12-18 DIAGNOSIS — L408 Other psoriasis: Secondary | ICD-10-CM | POA: Diagnosis not present

## 2020-12-18 LAB — COMPREHENSIVE METABOLIC PANEL
ALT: 14 U/L (ref 0–53)
AST: 12 U/L (ref 0–37)
Albumin: 4.5 g/dL (ref 3.5–5.2)
Alkaline Phosphatase: 47 U/L (ref 39–117)
BUN: 19 mg/dL (ref 6–23)
CO2: 28 mEq/L (ref 19–32)
Calcium: 9.6 mg/dL (ref 8.4–10.5)
Chloride: 100 mEq/L (ref 96–112)
Creatinine, Ser: 0.87 mg/dL (ref 0.40–1.50)
GFR: 95.05 mL/min (ref >60.00)
Glucose, Bld: 133 mg/dL — ABNORMAL HIGH (ref 70–99)
Potassium: 4.6 mEq/L (ref 3.5–5.1)
Sodium: 137 mEq/L (ref 135–145)
Total Bilirubin: 0.4 mg/dL (ref 0.2–1.2)
Total Protein: 6.7 g/dL (ref 6.0–8.3)

## 2020-12-18 LAB — CBC WITH DIFFERENTIAL/PLATELET
Basophils Absolute: 0.1 10*3/uL (ref 0.0–0.1)
Basophils Relative: 1 % (ref 0.0–3.0)
Eosinophils Absolute: 0.7 10*3/uL (ref 0.0–0.7)
Eosinophils Relative: 9.6 % — ABNORMAL HIGH (ref 0.0–5.0)
HCT: 37.5 % — ABNORMAL LOW (ref 39.0–52.0)
Hemoglobin: 12.6 g/dL — ABNORMAL LOW (ref 13.0–17.0)
Lymphocytes Relative: 21.6 % (ref 12.0–46.0)
Lymphs Abs: 1.6 10*3/uL (ref 0.7–4.0)
MCHC: 33.6 g/dL (ref 30.0–36.0)
MCV: 93.6 fl (ref 78.0–100.0)
Monocytes Absolute: 0.6 10*3/uL (ref 0.1–1.0)
Monocytes Relative: 8.2 % (ref 3.0–12.0)
Neutro Abs: 4.6 10*3/uL (ref 1.4–7.7)
Neutrophils Relative %: 59.6 % (ref 43.0–77.0)
Platelets: 272 10*3/uL (ref 150.0–400.0)
RBC: 4.01 Mil/uL — ABNORMAL LOW (ref 4.22–5.81)
RDW: 13.7 % (ref 11.5–15.5)
WBC: 7.6 10*3/uL (ref 4.0–10.5)

## 2020-12-18 LAB — HEMOGLOBIN A1C: Hgb A1c MFr Bld: 7.1 % — ABNORMAL HIGH (ref 4.6–6.5)

## 2020-12-18 LAB — PSA: PSA: 1.12 ng/mL (ref 0.10–4.00)

## 2020-12-18 NOTE — Progress Notes (Signed)
Subjective:    Patient ID: Barry Bush, male    DOB: 17-Mar-1961, 60 y.o.   MRN: [id number]  DOS:  12/18/2020 Type of visit - description: rov  Today we talk about diabetes, hypertension, neuropathy, psoriasis and prostate cancer screening. Good compliance with medication, ambulatory blood sugars in the 130s, 140s. He remains very active but diet is not healthy.  Review of Systems Denies any dysuria, gross hematuria or difficulty urinating  Past Medical History:  Diagnosis Date   Acute serous otitis media of left ear 10/2014   Dr. Ilda Foil   Diabetes mellitus 08-2010   Esophageal stricture 2003   s/p dilation   Eustachian tube disorder 10/2014   Dr. Ilda Foil   GERD (gastroesophageal reflux disease)    Gout    Hypertension    Psoriasis    sees derm   PSORIASIS 10/16/2006   Qualifier: Diagnosis of  By: Dawson Bills      Past Surgical History:  Procedure Laterality Date   Facial Trauma      Allergies as of 12/18/2020   No Known Allergies      Medication List        Accurate as of December 18, 2020  9:44 AM. If you have any questions, ask your nurse or doctor.          allopurinol 100 MG tablet Commonly known as: ZYLOPRIM Take 1 tablet (100 mg total) by mouth daily.   amLODipine 10 MG tablet Commonly known as: NORVASC Take 1 tablet (10 mg total) by mouth daily.   aspirin 81 MG tablet Take 81 mg by mouth daily.   atorvastatin 10 MG tablet Commonly known as: LIPITOR Take 1 tablet (10 mg total) by mouth daily.   fluticasone 50 MCG/ACT nasal spray Commonly known as: FLONASE Place 2 sprays into both nostrils daily.   freestyle lancets Check blood sugars twice daily   FREESTYLE LITE test strip Generic drug: glucose blood Check blood sugars twice daily   FreeStyle Lite w/Device Kit 1 Device by Does not apply route as directed. Check blood sugars twice daily   gabapentin 800 MG tablet Commonly known as: NEURONTIN Take 800 mg by mouth 2 (two)  times daily.   hydrochlorothiazide 25 MG tablet Commonly known as: HYDRODIURIL Take 1 tablet (25 mg total) by mouth daily.   loratadine 10 MG tablet Commonly known as: CLARITIN TAKE 1 TABLET BY MOUTH DAILY.   losartan 100 MG tablet Commonly known as: COZAAR Take 1 tablet (100 mg total) by mouth daily.   metFORMIN 850 MG tablet Commonly known as: GLUCOPHAGE TAKE 2 TABLETS BY MOUTH WITH BREAKFAST AND 1 TABLET BY MOUTH WITH DINNER.   metoprolol tartrate 100 MG tablet Commonly known as: LOPRESSOR Take 1 tablet (100 mg total) by mouth 2 (two) times daily.   omeprazole 20 MG capsule Commonly known as: PRILOSEC Take 20 mg by mouth daily.   pioglitazone 30 MG tablet Commonly known as: ACTOS Take 1 tablet (30 mg total) by mouth daily.   sildenafil 20 MG tablet Commonly known as: REVATIO Take 4-5 tablets (80-100 mg total) by mouth at bedtime as needed.           Objective:   Physical Exam BP 132/70 (BP Location: Left Arm, Patient Position: Sitting, Cuff Size: Normal)   Pulse (!) 54   Temp 98.4 F (36.9 C) (Oral)   Resp 16   Ht _0  (1.854 m)   Wt 236 lb (107 kg)   SpO2 98%  BMI 31.14 kg/m  General:   Well developed, NAD, BMI noted. HEENT:  Normocephalic . Face symmetric, atraumatic Lungs:  CTA B Normal respiratory effort, no intercostal retractions, no accessory muscle use. Heart: RRR,  no murmur.  Lower extremities: no pretibial edema bilaterally  Skin: Not pale. Not jaundice DRE: Declined Neurologic:  alert & oriented X3.  Speech normal, gait appropriate for age and unassisted Psych--  Cognition and judgment appear intact.  Cooperative with normal attention span and concentration.  Behavior appropriate. No anxious or depressed appearing.      Assessment    Assessment DM  dx 2012 Mild neuropathy dx 10-2015, saw neuro, 08-2020, extensive blood work (-) NCS confirmed mild neuropathy, likely DM related. HTN GERD Esophageal stricture, dilatation  2003 Gout Psoriasis  Eustachian  tube disorder, saw ENT 2016 B12 deficiency DX 02-2019  PLAN: DM: Currently on metformin, Actos.  Ambulatory CBGs 130, 140.  Check A1c, strongly recommend to consider improve his diet, watch carbohydrate intake. Information provided. HTN: Seems well controlled on amlodipine, HCTZ, losartan, metoprolol.  Check a CMP and CBC Neuropathy: Saw neurology, EMG and NCS, showed evidence of neuropathy, mild.  Likely diabetic related. Cont gabapentin; was rec to see derm for psoriasis  Psoriasis: was ok w/ otezla d/c d/t $$, working w/ derm , would like to see derm within the [place] system, referral placed. Prostate cancer screening: Declined DRE, check PSA, no symptoms Vaccine advised: Recommend COVID #4 and flu shot yearly. RTC 4 months CPX  This visit occurred during the SARS-CoV-2 public health emergency.  Safety protocols were in place, including screening questions prior to the visit, additional usage of staff PPE, and extensive cleaning of exam room while observing appropriate contact time as indicated for disinfecting solutions.

## 2020-12-18 NOTE — Patient Instructions (Addendum)
See about improving your diet, watch your carbohydrate intake  Consider a 4th COVID shot   GO TO THE LAB : Get the blood work     GO TO THE FRONT DESK, PLEASE SCHEDULE YOUR APPOINTMENTS Come back for physical exam in 4 months

## 2020-12-19 ENCOUNTER — Other Ambulatory Visit: Payer: Self-pay | Admitting: Internal Medicine

## 2020-12-19 NOTE — Assessment & Plan Note (Signed)
PLAN: DM: Currently on metformin, Actos.  Ambulatory CBGs 130, 140.  Check A1c, strongly recommend to consider improve his diet, watch carbohydrate intake. Information provided. HTN: Seems well controlled on amlodipine, HCTZ, losartan, metoprolol.  Check a CMP and CBC Neuropathy: Saw neurology, EMG and NCS, showed evidence of neuropathy, mild.  Likely diabetic related. Cont gabapentin; was rec to see derm for psoriasis  Psoriasis: was ok w/ otezla d/c d/t $$, working w/ derm , would like to see derm within the [place] system, referral placed. Prostate cancer screening: Declined DRE, check PSA, no symptoms Vaccine advised: Recommend COVID #4 and flu shot yearly. RTC 4 months CPX

## 2020-12-21 MED ORDER — EMPAGLIFLOZIN 10 MG PO TABS: 10.0000 mg | ORAL_TABLET | Freq: Every day | ORAL | 1 refills | Status: DC

## 2020-12-21 NOTE — Telephone Encounter (Signed)
Labs completed Friday, okay to refill metformin 850mg  and atorvastatin 10mg ?

## 2020-12-21 NOTE — Telephone Encounter (Signed)
Please call patient: Labs okay except for diabetes needs better control. Continue same medications, okay refills. Add Jardiance 10 mg 1 p.o. every morning, send a 42-month supply. Advised to call if he has any symptoms such as rash at the genital area. Needs to stay hydrated with water

## 2020-12-21 NOTE — Telephone Encounter (Signed)
Spoke w/ Pt- informed of results and recommendations.Pt verbalized understanding. Jardiance 10mg  sent to [facility].

## 2021-01-01 ENCOUNTER — Other Ambulatory Visit: Payer: Self-pay | Admitting: Internal Medicine

## 2021-01-06 ENCOUNTER — Encounter: Payer: Self-pay | Admitting: Internal Medicine

## 2021-02-08 ENCOUNTER — Telehealth: Payer: Self-pay

## 2021-02-08 MED ORDER — GLIMEPIRIDE 2 MG PO TABS: 2.0000 mg | ORAL_TABLET | Freq: Every day | ORAL | 3 refills | Status: DC

## 2021-02-08 NOTE — Telephone Encounter (Signed)
Please advise 

## 2021-02-08 NOTE — Telephone Encounter (Signed)
Spoke w/ Pt- informed of recommendations. Pt states he does not skip meals but will keep that in mind if he happens to skip a meal. Rx sent to [facility].

## 2021-02-08 NOTE — Telephone Encounter (Signed)
We can switch temporarily to glimepiride 2 mg.  1 tablet daily.  Send #30 and 3 refills. Other diabetes medications the same. If he plans to skip breakfast or a meal: Do not take glimepiride that day. Watch for low blood sugar symptoms.

## 2021-02-08 NOTE — Addendum Note (Signed)
Addended byConrad Vermillion D on: 02/08/2021 04:01 PM   Modules accepted: Orders

## 2021-02-08 NOTE — Telephone Encounter (Signed)
Pt called saying the Jardiance costs too much now that he is in the donut whole. He asks if you suggest a replacement for the time being?

## 2021-02-18 DIAGNOSIS — Z79899 Other long term (current) drug therapy: Secondary | ICD-10-CM | POA: Diagnosis not present

## 2021-02-18 DIAGNOSIS — L570 Actinic keratosis: Secondary | ICD-10-CM | POA: Diagnosis not present

## 2021-02-18 DIAGNOSIS — L400 Psoriasis vulgaris: Secondary | ICD-10-CM | POA: Diagnosis not present

## 2021-03-03 ENCOUNTER — Other Ambulatory Visit: Payer: Self-pay | Admitting: Internal Medicine

## 2021-03-16 DIAGNOSIS — L409 Psoriasis, unspecified: Secondary | ICD-10-CM | POA: Diagnosis not present

## 2021-04-06 ENCOUNTER — Telehealth: Payer: Self-pay

## 2021-04-06 MED ORDER — GLIPIZIDE ER 5 MG PO TB24: 5.0000 mg | ORAL_TABLET | Freq: Every day | ORAL | 3 refills | Status: DC

## 2021-04-06 NOTE — Telephone Encounter (Signed)
Advise patient: Stop glimepiride I sent glipizide XL 5 mg 1 tablet before breakfast. This is a slow release medicine, hopefully that will work well for him.

## 2021-04-06 NOTE — Telephone Encounter (Signed)
I spoke w/ Pt today- he informed he had to stop glimepiride, around 10am every morning he would get very jittery, and hungry, did not check blood sugars to make sure they weren't low, he stopped and restarted but had the same symptoms again. Just wanted to let PCP know that he isn't taking it.

## 2021-04-07 NOTE — Telephone Encounter (Signed)
Spoke w/ Pt- informed of recommendations. Pt verbalized understanding.  

## 2021-04-23 ENCOUNTER — Other Ambulatory Visit: Payer: Self-pay | Admitting: Internal Medicine

## 2021-04-27 ENCOUNTER — Other Ambulatory Visit: Payer: Self-pay | Admitting: Internal Medicine

## 2021-05-18 DIAGNOSIS — L400 Psoriasis vulgaris: Secondary | ICD-10-CM | POA: Diagnosis not present

## 2021-05-18 DIAGNOSIS — Z79899 Other long term (current) drug therapy: Secondary | ICD-10-CM | POA: Diagnosis not present

## 2021-06-04 ENCOUNTER — Other Ambulatory Visit: Payer: Self-pay | Admitting: Internal Medicine

## 2021-06-07 ENCOUNTER — Other Ambulatory Visit: Payer: Self-pay | Admitting: Internal Medicine

## 2021-06-08 ENCOUNTER — Ambulatory Visit (INDEPENDENT_AMBULATORY_CARE_PROVIDER_SITE_OTHER): Payer: BC Managed Care – PPO | Admitting: Internal Medicine

## 2021-06-08 ENCOUNTER — Encounter: Payer: Self-pay | Admitting: Internal Medicine

## 2021-06-08 VITALS — BP 134/72 | HR 70 | Temp 98.0°F | Resp 16 | Ht 73.0 in | Wt 238.0 lb

## 2021-06-08 DIAGNOSIS — E1140 Type 2 diabetes mellitus with diabetic neuropathy, unspecified: Secondary | ICD-10-CM | POA: Diagnosis not present

## 2021-06-08 DIAGNOSIS — I10 Essential (primary) hypertension: Secondary | ICD-10-CM | POA: Diagnosis not present

## 2021-06-08 DIAGNOSIS — Z23 Encounter for immunization: Secondary | ICD-10-CM | POA: Diagnosis not present

## 2021-06-08 DIAGNOSIS — Z0000 Encounter for general adult medical examination without abnormal findings: Secondary | ICD-10-CM | POA: Diagnosis not present

## 2021-06-08 DIAGNOSIS — E785 Hyperlipidemia, unspecified: Secondary | ICD-10-CM

## 2021-06-08 DIAGNOSIS — Z09 Encounter for follow-up examination after completed treatment for conditions other than malignant neoplasm: Secondary | ICD-10-CM

## 2021-06-08 LAB — BASIC METABOLIC PANEL
BUN: 18 mg/dL (ref 6–23)
CO2: 30 mEq/L (ref 19–32)
Calcium: 9.8 mg/dL (ref 8.4–10.5)
Chloride: 97 mEq/L (ref 96–112)
Creatinine, Ser: 0.86 mg/dL (ref 0.40–1.50)
GFR: 95.07 mL/min (ref >60.00)
Glucose, Bld: 118 mg/dL — ABNORMAL HIGH (ref 70–99)
Potassium: 4.6 mEq/L (ref 3.5–5.1)
Sodium: 135 mEq/L (ref 135–145)

## 2021-06-08 LAB — HEMOGLOBIN A1C: Hgb A1c MFr Bld: 6.9 % — ABNORMAL HIGH (ref 4.6–6.5)

## 2021-06-08 LAB — LIPID PANEL
Cholesterol: 141 mg/dL (ref 0–200)
HDL: 62 mg/dL (ref >39.00)
LDL Cholesterol: 67 mg/dL (ref 0–99)
NonHDL: 79.18
Total CHOL/HDL Ratio: 2
Triglycerides: 63 mg/dL (ref 0.0–149.0)
VLDL: 12.6 mg/dL (ref 0.0–40.0)

## 2021-06-08 MED ORDER — GABAPENTIN 800 MG PO TABS: 800.0000 mg | ORAL_TABLET | Freq: Three times a day (TID) | ORAL | 6 refills | Status: DC

## 2021-06-08 NOTE — Progress Notes (Signed)
Subjective:    Patient ID: Barry Bush, male    DOB: 18-Oct-1960, 60 y.o.   MRN: [id number]  DOS:  06/08/2021 Type of visit - description: CPX  Here for CPX Chronic medical problems were addressed. Neuropathy  >> feet tingling and numbness not completely well controlled.  Having symptoms during the daytime when he is at work.  Review of Systems  Other than above, a 14 point review of systems is negative      Past Medical History:  Diagnosis Date   Diabetes mellitus 08/26/2010   Esophageal stricture 06/27/2001   s/p dilation   Eustachian tube disorder 10/26/2014   Dr. 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  Tobacco Use   Smoking status: Never   Smokeless tobacco: Never  Vaping Use   Vaping Use: Never used  Substance and Sexual Activity   Alcohol use: Yes    Comment: 3 days a week   Drug use: No   Sexual activity: Not on file  Other Topics Concern   Not on file  Social History Narrative   Household- pt and wife   Right handed    Caffeine: 2 cups/day   Social Determinants of Health   Financial Resource Strain: Not on file  Food Insecurity: Not on file  Transportation Needs: Not on file  Physical Activity: Not on file  Stress: Not on file  Social Connections: Not on file  Intimate Partner Violence: Not on file    Allergies as of 06/08/2021   No Known Allergies      Medication List        Accurate as of June 08, 2021  5:44 PM. If you have any  questions, ask your nurse or doctor.          allopurinol 100 MG tablet Commonly known as: ZYLOPRIM TAKE 1 TABLET (100 MG TOTAL) BY MOUTH DAILY.   amLODipine 10 MG tablet Commonly known as: NORVASC Take 1 tablet (10 mg total) by mouth daily.   aspirin 81 MG tablet Take 81 mg by mouth daily.   atorvastatin 10 MG tablet Commonly known as: LIPITOR Take 1 tablet (10 mg total) by mouth daily.   empagliflozin 10 MG Tabs tablet Commonly known as: Jardiance Take 1 tablet (10 mg total) by mouth daily before breakfast.   fluticasone 50 MCG/ACT nasal spray Commonly known as: FLONASE Place 2 sprays into both nostrils daily.   freestyle lancets Check blood sugars twice daily   FREESTYLE LITE test strip Generic drug: glucose blood Check blood sugars twice daily   FreeStyle Lite w/Device Kit 1 Device by Does not apply route as directed. Check blood sugars twice daily   gabapentin 800 MG tablet Commonly known as: NEURONTIN Take 1 tablet (800 mg total) by mouth 3 (three) times daily. What changed: when to take this Changed by: Kathlene November, MD  glipiZIDE 5 MG 24 hr tablet Commonly known as: glipiZIDE XL Take 1 tablet (5 mg total) by mouth daily with breakfast.   hydrochlorothiazide 25 MG tablet Commonly known as: HYDRODIURIL Take 1 tablet (25 mg total) by mouth daily.   loratadine 10 MG tablet Commonly known as: CLARITIN TAKE 1 TABLET BY MOUTH DAILY.   losartan 100 MG tablet Commonly known as: COZAAR Take 1 tablet (100 mg total) by mouth daily.   metFORMIN 850 MG tablet Commonly known as: GLUCOPHAGE TAKE 2 TABLETS BY MOUTH WITH BREAKFAST AND 1 TABLET BY MOUTH WITH DINNER.   metoprolol tartrate 100 MG tablet Commonly known as: LOPRESSOR TAKE 1 TABLET BY MOUTH 2 TIMES DAILY.   omeprazole 20 MG capsule Commonly known as: PRILOSEC Take 20 mg by mouth daily.   pioglitazone 30 MG tablet Commonly known as: ACTOS TAKE 1 TABLET (30 MG TOTAL) BY MOUTH DAILY.    sildenafil 20 MG tablet Commonly known as: REVATIO Take 4-5 tablets (80-100 mg total) by mouth at bedtime as needed.           Objective:   Physical Exam BP 134/72 (BP Location: Left Arm, Patient Position: Sitting, Cuff Size: Normal)    Pulse 70    Temp 98 F (36.7 C) (Oral)    Resp 16    Ht 6' 1"  (1.854 m)    Wt 238 lb (108 kg)    SpO2 97%    BMI 31.40 kg/m  General: Well developed, NAD, BMI noted Neck: No  thyromegaly  HEENT:  Normocephalic . Face symmetric, atraumatic Lungs:  CTA B Normal respiratory effort, no intercostal retractions, no accessory muscle use. Heart: RRR,  no murmur.  Abdomen:  Not distended, soft, non-tender. No rebound or rigidity.   DM foot exam: No edema, skin normal, pinprick examination: Numbness distally in both feet.  Good pedal pulses Skin: Exposed areas without rash. Not pale. Not jaundice Neurologic:  alert & oriented X3.  Speech normal, gait appropriate for age and unassisted Strength symmetric and appropriate for age.  Psych: Cognition and judgment appear intact.  Cooperative with normal attention span and concentration.  Behavior appropriate. No anxious or depressed appearing.     Assessment     Assessment DM  dx 2012 Mild neuropathy dx 10-2015, saw neuro, 08-2020, extensive blood work (-) NCS confirmed mild neuropathy, likely DM related. HTN GERD Esophageal stricture, dilatation 2003 Gout Psoriasis  Eustachian  tube disorder, saw ENT 2016 B12 deficiency DX 02-2018 + FH CAD  PLAN: Here for CPX DM: On Jardiance, glipizide, metformin, Actos.  Ambulatory CBGs fasting 115, postprandial 160.  Check A1c. Neuropathy: Feet care today confirm diagnosis, skin is normal, needs better sx control, increase gabapentin 800 mg from BID to TID.  Rx sent Feet care discussed. HTN: Ambulatory BPs in the 130s, continue amlodipine, HCTZ, losartan, metoprolol. Gout: No recent episodes B12 deficiency: Last level satisfactory, encourage daily  supplements Psoriasis: Sees dermatology, tried Stelara, cost was an issue, plans to try something else FH CAD: On aspirin, controlling CV RF RTC 4 months      This visit occurred during the SARS-CoV-2 public health emergency.  Safety protocols were in place, including screening questions prior to the visit, additional usage of staff PPE, and extensive cleaning of exam room while observing appropriate contact time as indicated for disinfecting solutions.

## 2021-06-08 NOTE — Assessment & Plan Note (Signed)
Here for CPX DM: On Jardiance, glipizide, metformin, Actos.  Ambulatory CBGs fasting 115, postprandial 160.  Check A1c. Neuropathy: Feet care today confirm diagnosis, skin is normal, needs better sx control, increase gabapentin 800 mg from BID to TID.  Rx sent Feet care discussed. HTN: Ambulatory BPs in the 130s, continue amlodipine, HCTZ, losartan, metoprolol. Gout: No recent episodes B12 deficiency: Last level satisfactory, encourage daily supplements Psoriasis: Sees dermatology, tried Stelara, cost was an issue, plans to try something else FH CAD: On aspirin, controlling CV RF RTC 4 months

## 2021-06-08 NOTE — Patient Instructions (Addendum)
Per our records you are due for your diabetic eye exam. Please contact your eye doctor to schedule an appointment. Please have them send copies of your office visit notes to [place]. Our fax number is [phone]. If you need a referral to an eye doctor please let us know.  Continue checking your blood sugars  Continue checking your blood pressure regularly  BP GOAL is between 110/65 and  135/85. If it is consistently higher or lower, let me know     GO TO THE LAB : Get the blood work     GO TO THE FRONT DESK, PLEASE SCHEDULE YOUR APPOINTMENTS Come back for a checkup in 4 months    Diabetes Mellitus and Foot Care Foot care is an important part of your health, especially when you have diabetes. Diabetes may cause you to have problems because of poor blood flow (circulation) to your feet and legs, which can cause your skin to: Become thinner and drier. Break more easily. Heal more slowly. Peel and crack. You may also have nerve damage (neuropathy) in your legs and feet, causing decreased feeling in them. This means that you may not notice minor injuries to your feet that could lead to more serious problems. Noticing and addressing any potential problems early is the best way to prevent future foot problems. How to care for your feet Foot hygiene  Wash your feet daily with warm water and mild soap. Do not use hot water. Then, pat your feet and the areas between your toes until they are completely dry. Do not soak your feet as this can dry your skin. Trim your toenails straight across. Do not dig under them or around the cuticle. File the edges of your nails with an emery board or nail file. Apply a moisturizing lotion or petroleum jelly to the skin on your feet and to dry, brittle toenails. Use lotion that does not contain alcohol and is unscented. Do not apply lotion between your toes. Shoes and socks Wear clean socks or stockings every day. Make sure they are not too tight. Do not wear  knee-high stockings since they may decrease blood flow to your legs. Wear shoes that fit properly and have enough cushioning. Always look in your shoes before you put them on to be sure there are no objects inside. To break in new shoes, wear them for just a few hours a day. This prevents injuries on your feet. Wounds, scrapes, corns, and calluses  Check your feet daily for blisters, cuts, bruises, sores, and redness. If you cannot see the bottom of your feet, use a mirror or ask someone for help. Do not cut corns or calluses or try to remove them with medicine. If you find a minor scrape, cut, or break in the skin on your feet, keep it and the skin around it clean and dry. You may clean these areas with mild soap and water. Do not clean the area with peroxide, alcohol, or iodine. If you have a wound, scrape, corn, or callus on your foot, look at it several times a day to make sure it is healing and not infected. Check for: Redness, swelling, or pain. Fluid or blood. Warmth. Pus or a bad smell. General tips Do not cross your legs. This may decrease blood flow to your feet. Do not use heating pads or hot water bottles on your feet. They may burn your skin. If you have lost feeling in your feet or legs, you may not know  this is happening until it is too late. Protect your feet from hot and cold by wearing shoes, such as at the beach or on hot pavement. Schedule a complete foot exam at least once a year (annually) or more often if you have foot problems. Report any cuts, sores, or bruises to your health care provider immediately. Where to find more information American Diabetes Association: www.diabetes.org Association of Diabetes Care & Education Specialists: www.diabeteseducator.org Contact a health care provider if: You have a medical condition that increases your risk of infection and you have any cuts, sores, or bruises on your feet. You have an injury that is not healing. You have redness  on your legs or feet. You feel burning or tingling in your legs or feet. You have pain or cramps in your legs and feet. Your legs or feet are numb. Your feet always feel cold. You have pain around any toenails. Get help right away if: You have a wound, scrape, corn, or callus on your foot and: You have pain, swelling, or redness that gets worse. You have fluid or blood coming from the wound, scrape, corn, or callus. Your wound, scrape, corn, or callus feels warm to the touch. You have pus or a bad smell coming from the wound, scrape, corn, or callus. You have a fever. You have a red line going up your leg. Summary Check your feet every day for blisters, cuts, bruises, sores, and redness. Apply a moisturizing lotion or petroleum jelly to the skin on your feet and to dry, brittle toenails. Wear shoes that fit properly and have enough cushioning. If you have foot problems, report any cuts, sores, or bruises to your health care provider immediately. Schedule a complete foot exam at least once a year (annually) or more often if you have foot problems. This information is not intended to replace advice given to you by your health care provider. Make sure you discuss any questions you have with your health care provider. Document Revised: 01/02/2020 Document Reviewed: 01/02/2020 Elsevier Patient Education  2022 [employer].

## 2021-06-08 NOTE — Assessment & Plan Note (Signed)
--  Td 2015 -  PNM 2015; PNM 13: 2016 - covd vax x 2, rec booster  - shingrix :d/w pt, provider #1 today, come back in 2- 3 months -  Flu shot : today -- CCS:  cscope 04-2013, next  10 years per report --Prostate cancer screening: declined DRE 11/2020, PSA was  wnl   --Diet and exercise:  d/w pt   -Labs: BMP, FLP,A1c, -POA package of information provided

## 2021-06-09 MED ORDER — EMPAGLIFLOZIN 25 MG PO TABS: 25.0000 mg | ORAL_TABLET | Freq: Every day | ORAL | 1 refills | Status: DC

## 2021-06-09 NOTE — Addendum Note (Signed)
Addended byConrad Maui D on: 06/09/2021 01:35 PM   Modules accepted: Orders

## 2021-06-16 ENCOUNTER — Other Ambulatory Visit: Payer: Self-pay | Admitting: Internal Medicine

## 2021-06-17 DIAGNOSIS — Z79899 Other long term (current) drug therapy: Secondary | ICD-10-CM | POA: Diagnosis not present

## 2021-06-17 DIAGNOSIS — L409 Psoriasis, unspecified: Secondary | ICD-10-CM | POA: Diagnosis not present

## 2021-07-07 ENCOUNTER — Ambulatory Visit: Payer: BC Managed Care – PPO | Admitting: Physician Assistant

## 2021-07-19 ENCOUNTER — Other Ambulatory Visit: Payer: Self-pay | Admitting: Internal Medicine

## 2021-07-30 ENCOUNTER — Other Ambulatory Visit: Payer: Self-pay | Admitting: Internal Medicine

## 2021-08-30 ENCOUNTER — Other Ambulatory Visit: Payer: Self-pay | Admitting: Internal Medicine

## 2021-09-17 DIAGNOSIS — Z79899 Other long term (current) drug therapy: Secondary | ICD-10-CM | POA: Diagnosis not present

## 2021-09-17 DIAGNOSIS — L409 Psoriasis, unspecified: Secondary | ICD-10-CM | POA: Diagnosis not present

## 2021-09-30 ENCOUNTER — Other Ambulatory Visit: Payer: Self-pay | Admitting: Internal Medicine

## 2021-10-07 ENCOUNTER — Encounter: Payer: Self-pay | Admitting: Internal Medicine

## 2021-10-07 ENCOUNTER — Ambulatory Visit: Payer: BC Managed Care – PPO | Admitting: Internal Medicine

## 2021-10-07 VITALS — BP 126/72 | HR 79 | Temp 97.7°F | Resp 16 | Ht 73.0 in | Wt 239.2 lb

## 2021-10-07 DIAGNOSIS — I10 Essential (primary) hypertension: Secondary | ICD-10-CM | POA: Diagnosis not present

## 2021-10-07 DIAGNOSIS — G629 Polyneuropathy, unspecified: Secondary | ICD-10-CM

## 2021-10-07 DIAGNOSIS — E1140 Type 2 diabetes mellitus with diabetic neuropathy, unspecified: Secondary | ICD-10-CM | POA: Diagnosis not present

## 2021-10-07 LAB — COMPREHENSIVE METABOLIC PANEL
ALT: 15 U/L (ref 0–53)
AST: 13 U/L (ref 0–37)
Albumin: 4.5 g/dL (ref 3.5–5.2)
Alkaline Phosphatase: 43 U/L (ref 39–117)
BUN: 20 mg/dL (ref 6–23)
CO2: 29 mEq/L (ref 19–32)
Calcium: 9.4 mg/dL (ref 8.4–10.5)
Chloride: 97 mEq/L (ref 96–112)
Creatinine, Ser: 0.91 mg/dL (ref 0.40–1.50)
GFR: 92.32 mL/min (ref >60.00)
Glucose, Bld: 115 mg/dL — ABNORMAL HIGH (ref 70–99)
Potassium: 4.6 mEq/L (ref 3.5–5.1)
Sodium: 134 mEq/L — ABNORMAL LOW (ref 135–145)
Total Bilirubin: 0.4 mg/dL (ref 0.2–1.2)
Total Protein: 6.6 g/dL (ref 6.0–8.3)

## 2021-10-07 LAB — HEMOGLOBIN A1C: Hgb A1c MFr Bld: 6.6 % — ABNORMAL HIGH (ref 4.6–6.5)

## 2021-10-07 NOTE — Progress Notes (Signed)
? ?Subjective:  ? ? Patient ID: Barry Bush, male    DOB: [birth year], 61 y.o.   MRN: [id number] ? ?DOS:  10/07/2021 ?Type of visit - description: f/u ? ?Here for follow-up. ?Today with talk about hypertension, diabetes, neuropathy. ?Also psoriasis followed by dermatology. ?Ambulatory BPs are really good. ?Sometimes when he is 4 hours into fasting , he feels slightly jittery but no other symptoms. ? ? ?Review of Systems ?See above  ? ?Past Medical History:  ?Diagnosis Date  ? Diabetes mellitus 08/26/2010  ? Esophageal stricture 06/27/2001  ? s/p dilation  ? Eustachian tube disorder 10/26/2014  ? Dr. Ilda Foil  ? GERD (gastroesophageal reflux disease)   ? Gout   ? Hypertension   ? PSORIASIS 10/16/2006  ? Qualifier: Diagnosis of  By: Dawson Bills    ? ? ?Past Surgical History:  ?Procedure Laterality Date  ? Facial Trauma    ? ? ?Current Outpatient Medications  ?Medication Instructions  ? allopurinol (ZYLOPRIM) 100 mg, Oral, Daily  ? amLODipine (NORVASC) 10 mg, Oral, Daily  ? aspirin 81 mg, Daily  ? atorvastatin (LIPITOR) 10 mg, Oral, Daily  ? Blood Glucose Monitoring Suppl (FREESTYLE LITE) w/Device KIT 1 Device, Does not apply, As directed, Check blood sugars twice daily  ? empagliflozin (JARDIANCE) 25 mg, Oral, Daily before breakfast  ? fluticasone (FLONASE) 50 MCG/ACT nasal spray 2 sprays, Each Nare, Daily  ? gabapentin (NEURONTIN) 800 mg, Oral, 3 times daily  ? glipiZIDE (GLUCOTROL XL) 5 MG 24 hr tablet TAKE 1 TABLET BY MOUTH DAILY WITH BREAKFAST.  ? glucose blood (FREESTYLE LITE) test strip Check blood sugars twice daily  ? hydrochlorothiazide (HYDRODIURIL) 25 mg, Oral, Daily  ? Lancets (FREESTYLE) lancets Check blood sugars twice daily  ? loratadine (CLARITIN) 10 MG tablet TAKE 1 TABLET BY MOUTH DAILY.  ? losartan (COZAAR) 100 mg, Oral, Daily  ? metFORMIN (GLUCOPHAGE) 850 MG tablet TAKE 2 TABLETS BY MOUTH WITH BREAKFAST AND 1 TABLET BY MOUTH WITH DINNER.  ? metoprolol tartrate (LOPRESSOR) 100 MG tablet TAKE  1 TABLET BY MOUTH 2 TIMES DAILY.  ? omeprazole (PRILOSEC) 20 mg, Oral, Daily,    ? pioglitazone (ACTOS) 30 mg, Oral, Daily  ? Risankizumab-rzaa (SKYRIZI PEN) 150 MG/ML SOAJ See admin instructions  ? sildenafil (REVATIO) 20 MG tablet TAKE 4 TO 5 TABLETS BY MOUTH AT BEDTIME AS NEEDED  ? ? ?   ?Objective:  ? Physical Exam ?BP 126/72 (BP Location: Left Arm, Patient Position: Sitting, Cuff Size: Normal)   Pulse 79   Temp 97.7 ?F (36.5 ?C) (Oral)   Resp 16   Ht 6' 1"  (1.854 m)   Wt 239 lb 4 oz (108.5 kg)   SpO2 98%   BMI 31.57 kg/m?  ?General:   ?Well developed, NAD, BMI noted. ?HEENT:  ?Normocephalic . Face symmetric, atraumatic ?Lungs:  ?CTA B ?Normal respiratory effort, no intercostal retractions, no accessory muscle use. ?Heart: RRR,  no murmur.  ?Lower extremities: no pretibial edema bilaterally  ?Skin: Not pale. Not jaundice ?Neurologic:  ?alert & oriented X3.  ?Speech normal, gait appropriate for age and unassisted ?Psych--  ?Cognition and judgment appear intact.  ?Cooperative with normal attention span and concentration.  ?Behavior appropriate. ?No anxious or depressed appearing.  ? ?   ?Assessment   ? ?Assessment ?DM  dx 2012 ?Mild neuropathy dx 10-2015, saw neuro, 08-2020, extensive blood work (-) NCS confirmed mild neuropathy, likely DM related. ?HTN ?GERD ?Esophageal stricture, dilatation 2003 ?Gout ?Psoriasis  ?Eustachian  tube disorder, saw  ENT 2016 ?B12 deficiency DX 02-2018 ?+ FH CAD ? ?PLAN: ?DM: Last A1c 6.9, slightly better than before.  Continue Jardiance, glipizide, metformin, Actos.  Occasionally jittery at noon, probably hypoglycemia, recommend to check a CBG when that happened to document.  If the blood sugar is less than 90, that is likely the etiology and recommend a healthy snack until he can have lunch. ?HTN: On amlodipine, HCTZ, losartan, metoprolol.  Check CMP.  Reports ambulatory BPs are very good. ?High cholesterol : last LDL 67, well controlled. ?Neuropathy: Gabapentin dose increased  at the last visit, currently on 800 mg 3 times daily.  Symptoms are about the same, he found that diabetic shoes and socks that  help.  We agreed no change for now ?Psoriasis: Orson Ape is actually helping but the cost is extremely high. ?RTC 4 months ? ? ? ?This visit occurred during the SARS-CoV-2 public health emergency.  Safety protocols were in place, including screening questions prior to the visit, additional usage of staff PPE, and extensive cleaning of exam room while observing appropriate contact time as indicated for disinfecting solutions.  ? ?

## 2021-10-07 NOTE — Assessment & Plan Note (Addendum)
DM: Last A1c 6.9, slightly better than before.  Continue Jardiance, glipizide, metformin, Actos.  Occasionally jittery at noon, probably hypoglycemia, recommend to check a CBG when that happened to document.  If the blood sugar is less than 90, that is likely the etiology and recommend a healthy snack until he can have lunch. ?HTN: On amlodipine, HCTZ, losartan, metoprolol.  Check CMP.  Reports ambulatory BPs are very good. ?High cholesterol : last LDL 67, well controlled. ?Neuropathy: Gabapentin dose increased at the last visit, currently on 800 mg 3 times daily.  Symptoms are about the same, he found that diabetic shoes and socks that  help.  We agreed no change for now ?Psoriasis: Cristy Folks is actually helping but the cost is extremely high. ?RTC 4 months ? ?

## 2021-10-07 NOTE — Patient Instructions (Addendum)
Per our records you are due for your diabetic eye exam. Please contact your eye doctor to schedule an appointment. Please have them send copies of your office visit notes to [place]. Our fax number is [phone]. If you need a referral to an eye doctor please let us know. ? ? ?Diabetes: ?Check your blood sugar at different times. ?Also check when you feel "jittery". ? ?- early in AM fasting  ( goal 90-130) ?- 2 hours after a meal (goal less than 180) ?- bedtime (goal 90-150) ? ? ? ?Check the  blood pressure regularly ?BP GOAL is between 110/65 and  135/85. ?If it is consistently higher or lower, let me know ? ? ?  ?GO TO THE LAB : Get the blood work   ? ? ?GO TO THE FRONT DESK, PLEASE SCHEDULE YOUR APPOINTMENTS ?Come back for a checkup in 4 months ?

## 2021-10-19 ENCOUNTER — Telehealth: Payer: Self-pay | Admitting: Family Medicine

## 2021-10-19 NOTE — Telephone Encounter (Signed)
Rescheduled 6/14 appointment with pt over the phone- Amy out of the office. ?

## 2021-10-22 ENCOUNTER — Other Ambulatory Visit: Payer: Self-pay | Admitting: Internal Medicine

## 2021-10-27 DIAGNOSIS — E119 Type 2 diabetes mellitus without complications: Secondary | ICD-10-CM | POA: Diagnosis not present

## 2021-10-27 LAB — HM DIABETES EYE EXAM

## 2021-11-04 ENCOUNTER — Encounter: Payer: Self-pay | Admitting: Internal Medicine

## 2021-11-29 NOTE — Progress Notes (Deleted)
No chief complaint on file.   HISTORY OF PRESENT ILLNESS:  11/29/21 ALL:  Barry Bush is a 61 y.o. male here today for follow up for neuropathy. He was seen in consult with Dr Rexene Alberts 08/2020 for worsening paresthesias of both feet. EMG showed mild axonal neuropathy. He was last seen in follow up 11/2020 and advised to continue gabapentin 853m per PCP. Since,    HISTORY (copied from Dr AGuadelupe Sabinprevious note)  Barry Bush a 61year old right-handed gentleman with an underlying medical history of hypertension, reflux disease, gout, psoriasis, diabetes and obesity, who presents for follow-up consultation of his neuropathy.  The patient is unaccompanied today.  I first met him at the request of his primary care physician on 08/27/2020, at which time he reported a several year history of approximately 6 to 7 years of paresthesias affecting his feet.  He had had recent blood work through his primary care physician.  Examination showed decreased sensation to pinprick and vibration in the distal lower extremities bilaterally.  He was on gabapentin which had been started by his primary care physician and he was on 600 mg twice daily at the time.  A1c has been below 7, he had a positive ANA and was advised to talk to his primary care physician about further evaluation for this.  I suggested we proceed with EMG and nerve conduction velocity testing as well as additional blood work through our office.  He was advised to continue with gabapentin.  He was advised to consider seeing dermatology for his psoriasis which had become worse.   Blood work from 08/27/2020 included multiple myeloma panel, vitamin B1, vitamin B6, RPR, ESR, heavy metal profile, vitamin D.  Labs were benign and he was notified.   He had and EMG/NCV test through our office on 09/09/20 and I reviewed the results:    Conclusion: This is an abnormal study. There is electrodiagnostic evidence of mild length dependent axonal  neuropathy, mainly involving sensory component.   We called him with his test results.    Today, 12/03/20: He reports feeling stable.  His primary care physician increased the gabapentin to 800 mg twice daily.  He reports doing well with that and it is helpful.  He has not had any major side effects from it.  Symptoms are about the same, he has not had any recent falls.  He did see dermatology in April and is waiting to start Stelara, previously was on [place] good success but insurance did not cover it any longer.   REVIEW OF SYSTEMS: Out of a complete 14 system review of symptoms, the patient complains only of the following symptoms, and all other reviewed systems are negative.   ALLERGIES: No Known Allergies   HOME MEDICATIONS: Outpatient Medications Prior to Visit  Medication Sig Dispense Refill   allopurinol (ZYLOPRIM) 100 MG tablet TAKE 1 TABLET (100 MG TOTAL) BY MOUTH DAILY. 90 tablet 1   amLODipine (NORVASC) 10 MG tablet Take 1 tablet (10 mg total) by mouth daily. 30 tablet 6   aspirin 81 MG tablet Take 81 mg by mouth daily.     atorvastatin (LIPITOR) 10 MG tablet TAKE 1 TABLET (10 MG TOTAL) BY MOUTH DAILY. 90 tablet 1   Blood Glucose Monitoring Suppl (FREESTYLE LITE) w/Device KIT 1 Device by Does not apply route as directed. Check blood sugars twice daily 1 kit 0   empagliflozin (JARDIANCE) 25 MG TABS tablet Take 1 tablet (25 mg total) by mouth daily  before breakfast. 90 tablet 1   fluticasone (FLONASE) 50 MCG/ACT nasal spray Place 2 sprays into both nostrils daily. 16 g 0   gabapentin (NEURONTIN) 800 MG tablet Take 1 tablet (800 mg total) by mouth 3 (three) times daily. 90 tablet 6   glipiZIDE (GLUCOTROL XL) 5 MG 24 hr tablet TAKE 1 TABLET BY MOUTH DAILY WITH BREAKFAST. 90 tablet 1   glucose blood (FREESTYLE LITE) test strip Check blood sugars twice daily 200 each 12   hydrochlorothiazide (HYDRODIURIL) 25 MG tablet TAKE 1 TABLET (25 MG TOTAL) BY MOUTH DAILY. 90 tablet 1    Lancets (FREESTYLE) lancets Check blood sugars twice daily 200 each 12   loratadine (CLARITIN) 10 MG tablet TAKE 1 TABLET BY MOUTH DAILY. 30 tablet 0   losartan (COZAAR) 100 MG tablet TAKE 1 TABLET (100 MG TOTAL) BY MOUTH DAILY. 90 tablet 1   metFORMIN (GLUCOPHAGE) 850 MG tablet TAKE 2 TABLETS BY MOUTH WITH BREAKFAST AND 1 TABLET BY MOUTH WITH DINNER. 270 tablet 1   metoprolol tartrate (LOPRESSOR) 100 MG tablet TAKE 1 TABLET BY MOUTH 2 TIMES DAILY. 180 tablet 1   omeprazole (PRILOSEC) 20 MG capsule Take 20 mg by mouth daily.     pioglitazone (ACTOS) 30 MG tablet TAKE 1 TABLET (30 MG TOTAL) BY MOUTH DAILY. 90 tablet 1   sildenafil (REVATIO) 20 MG tablet TAKE 4 TO 5 TABLETS BY MOUTH AT BEDTIME AS NEEDED 60 tablet 3   No facility-administered medications prior to visit.     PAST MEDICAL HISTORY: Past Medical History:  Diagnosis Date   Diabetes mellitus 08/26/2010   Esophageal stricture 06/27/2001   s/p dilation   Eustachian tube disorder 10/26/2014   Dr. Ilda Foil   GERD (gastroesophageal reflux disease)    Gout    Hypertension    PSORIASIS 10/16/2006   Qualifier: Diagnosis of  By: Dawson Bills       PAST SURGICAL HISTORY: Past Surgical History:  Procedure Laterality Date   Facial Trauma       FAMILY HISTORY: Family History  Problem Relation Age of Onset   Diabetes Mother    Diabetes Father    Heart attack Father        age 87, 2 stents   Lung cancer Other        GM   Melanoma Other        GF   Colon cancer Neg Hx    Prostate cancer Neg Hx      SOCIAL HISTORY: Social History   Socioeconomic History   Marital status: Married    Spouse name: Not on file   Number of children: 1   Years of education: Not on file   Highest education level: Not on file  Occupational History   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  Tobacco Use   Smoking status: Never   Smokeless tobacco: Never  Vaping Use   Vaping Use: Never used  Substance and  Sexual Activity   Alcohol use: Yes    Comment: 3 days a week   Drug use: No   Sexual activity: Not on file  Other Topics Concern   Not on file  Social History Narrative   Household- pt and wife   Right handed    Caffeine: 2 cups/day   Social Determinants of Health   Financial Resource Strain: Not on file  Food Insecurity: Not on file  Transportation Needs: Not on file  Physical Activity: Not on file  Stress: Not on file  Social Connections: Not on file  Intimate Partner Violence: Not on file     PHYSICAL EXAM  There were no vitals filed for this visit. There is no height or weight on file to calculate BMI.  Generalized: Well developed, in no acute distress  Cardiology: normal rate and rhythm, no murmur auscultated  Respiratory: clear to auscultation bilaterally    Neurological examination  Mentation: Alert oriented to time, place, history taking. Follows all commands speech and language fluent Cranial nerve II-XII: Pupils were equal round reactive to light. Extraocular movements were full, visual field were full on confrontational test. Facial sensation and strength were normal. Uvula tongue midline. Head turning and shoulder shrug  were normal and symmetric. Motor: The motor testing reveals 5 over 5 strength of all 4 extremities. Good symmetric motor tone is noted throughout.  Sensory: Sensory testing is intact to soft touch on all 4 extremities. No evidence of extinction is noted.  Coordination: Cerebellar testing reveals good finger-nose-finger and heel-to-shin bilaterally.  Gait and station: Gait is normal. Tandem gait is normal. Romberg is negative. No drift is seen.  Reflexes: Deep tendon reflexes are symmetric and normal bilaterally.    DIAGNOSTIC DATA (LABS, IMAGING, TESTING) - I reviewed patient records, labs, notes, testing and imaging myself where available.  Lab Results  Component Value Date   WBC 7.6 12/18/2020   HGB 12.6 (L) 12/18/2020   HCT 37.5 (L)  12/18/2020   MCV 93.6 12/18/2020   PLT 272.0 12/18/2020      Component Value Date/Time   NA 134 (L) 10/07/2021 0928   K 4.6 10/07/2021 0928   CL 97 10/07/2021 0928   CO2 29 10/07/2021 0928   GLUCOSE 115 (H) 10/07/2021 0928   BUN 20 10/07/2021 0928   CREATININE 0.91 10/07/2021 0928   CREATININE 0.88 03/06/2020 0722   CALCIUM 9.4 10/07/2021 0928   PROT 6.6 10/07/2021 0928   PROT 6.8 08/27/2020 0850   ALBUMIN 4.5 10/07/2021 0928   AST 13 10/07/2021 0928   ALT 15 10/07/2021 0928   ALKPHOS 43 10/07/2021 0928   BILITOT 0.4 10/07/2021 0928   GFRNONAA >89 10/27/2015 1600   GFRAA >89 10/27/2015 1600   Lab Results  Component Value Date   CHOL 141 06/08/2021   HDL 62.00 06/08/2021   LDLCALC 67 06/08/2021   LDLDIRECT 138.4 03/28/2011   TRIG 63.0 06/08/2021   CHOLHDL 2 06/08/2021   Lab Results  Component Value Date   HGBA1C 6.6 (H) 10/07/2021   Lab Results  Component Value Date   VITAMINB12 400 08/20/2020   Lab Results  Component Value Date   TSH 2.62 08/20/2020        View : No data to display.               View : No data to display.           ASSESSMENT AND PLAN  61 y.o. year old male  has a past medical history of Diabetes mellitus (08/26/2010), Esophageal stricture (06/27/2001), Eustachian tube disorder (10/26/2014), GERD (gastroesophageal reflux disease), Gout, Hypertension, and PSORIASIS (10/16/2006). here with    No diagnosis found.  Elam Dutch ***.  Healthy lifestyle habits encouraged. *** will follow up with PCP as directed. *** will return to see me in ***, sooner if needed. *** verbalizes understanding and agreement with this plan.   No orders of the defined types were placed in this encounter.    No orders of the defined  types were placed in this encounter.    Debbora Presto, MSN, FNP-C 11/29/2021, 12:12 PM  [facility] [address] [place], [place] [address] [phone]

## 2021-11-29 NOTE — Patient Instructions (Incomplete)

## 2021-11-30 ENCOUNTER — Ambulatory Visit: Payer: BC Managed Care – PPO | Admitting: Family Medicine

## 2021-11-30 ENCOUNTER — Encounter: Payer: Self-pay | Admitting: Family Medicine

## 2021-11-30 DIAGNOSIS — E1140 Type 2 diabetes mellitus with diabetic neuropathy, unspecified: Secondary | ICD-10-CM

## 2021-12-02 ENCOUNTER — Other Ambulatory Visit: Payer: Self-pay | Admitting: Internal Medicine

## 2021-12-08 ENCOUNTER — Ambulatory Visit: Payer: BC Managed Care – PPO | Admitting: Family Medicine

## 2021-12-13 ENCOUNTER — Telehealth: Payer: Self-pay

## 2021-12-13 ENCOUNTER — Ambulatory Visit: Payer: BC Managed Care – PPO | Admitting: Family Medicine

## 2021-12-13 ENCOUNTER — Other Ambulatory Visit: Payer: Self-pay | Admitting: Internal Medicine

## 2021-12-13 ENCOUNTER — Encounter: Payer: Self-pay | Admitting: Family Medicine

## 2021-12-13 VITALS — BP 120/88 | HR 75 | Temp 98.8°F | Resp 18 | Ht 73.0 in | Wt 236.2 lb

## 2021-12-13 DIAGNOSIS — B349 Viral infection, unspecified: Secondary | ICD-10-CM | POA: Diagnosis not present

## 2021-12-13 DIAGNOSIS — R112 Nausea with vomiting, unspecified: Secondary | ICD-10-CM

## 2021-12-13 DIAGNOSIS — E1140 Type 2 diabetes mellitus with diabetic neuropathy, unspecified: Secondary | ICD-10-CM | POA: Diagnosis not present

## 2021-12-13 LAB — POC URINALSYSI DIPSTICK (AUTOMATED)
Bilirubin, UA: NEGATIVE
Glucose, UA: NEGATIVE
Ketones, UA: NEGATIVE
Leukocytes, UA: NEGATIVE
Nitrite, UA: NEGATIVE
Protein, UA: POSITIVE — AB
Spec Grav, UA: 1.015 (ref 1.010–1.025)
Urobilinogen, UA: 0.2 E.U./dL
pH, UA: 6 (ref 5.0–8.0)

## 2021-12-13 LAB — CBC WITH DIFFERENTIAL/PLATELET
Basophils Absolute: 0.1 10*3/uL (ref 0.0–0.1)
Basophils Relative: 1.1 % (ref 0.0–3.0)
Eosinophils Absolute: 0 10*3/uL (ref 0.0–0.7)
Eosinophils Relative: 0.2 % (ref 0.0–5.0)
HCT: 38.5 % — ABNORMAL LOW (ref 39.0–52.0)
Hemoglobin: 12.9 g/dL — ABNORMAL LOW (ref 13.0–17.0)
Lymphocytes Relative: 15.2 % (ref 12.0–46.0)
Lymphs Abs: 0.7 10*3/uL (ref 0.7–4.0)
MCHC: 33.6 g/dL (ref 30.0–36.0)
MCV: 91.6 fl (ref 78.0–100.0)
Monocytes Absolute: 0.6 10*3/uL (ref 0.1–1.0)
Monocytes Relative: 12.7 % — ABNORMAL HIGH (ref 3.0–12.0)
Neutro Abs: 3.4 10*3/uL (ref 1.4–7.7)
Neutrophils Relative %: 70.8 % (ref 43.0–77.0)
Platelets: 186 10*3/uL (ref 150.0–400.0)
RBC: 4.2 Mil/uL — ABNORMAL LOW (ref 4.22–5.81)
RDW: 13.9 % (ref 11.5–15.5)
WBC: 4.8 10*3/uL (ref 4.0–10.5)

## 2021-12-13 LAB — COMPREHENSIVE METABOLIC PANEL
ALT: 42 U/L (ref 0–53)
AST: 36 U/L (ref 0–37)
Albumin: 4.2 g/dL (ref 3.5–5.2)
Alkaline Phosphatase: 58 U/L (ref 39–117)
BUN: 16 mg/dL (ref 6–23)
CO2: 26 mEq/L (ref 19–32)
Calcium: 9.3 mg/dL (ref 8.4–10.5)
Chloride: 92 mEq/L — ABNORMAL LOW (ref 96–112)
Creatinine, Ser: 1.11 mg/dL (ref 0.40–1.50)
GFR: 72.65 mL/min (ref >60.00)
Glucose, Bld: 93 mg/dL (ref 70–99)
Potassium: 4 mEq/L (ref 3.5–5.1)
Sodium: 132 mEq/L — ABNORMAL LOW (ref 135–145)
Total Bilirubin: 0.5 mg/dL (ref 0.2–1.2)
Total Protein: 6.9 g/dL (ref 6.0–8.3)

## 2021-12-13 LAB — POC INFLUENZA A&B (BINAX/QUICKVUE)
Influenza A, POC: NEGATIVE
Influenza B, POC: NEGATIVE

## 2021-12-13 MED ORDER — PROMETHAZINE HCL 25 MG/ML IJ SOLN
50.0000 mg | Freq: Four times a day (QID) | INTRAMUSCULAR | Status: DC | PRN
  Administered 2021-12-13: 50 mg via INTRAMUSCULAR

## 2021-12-13 MED ORDER — ONDANSETRON HCL 4 MG PO TABS: 4.0000 mg | ORAL_TABLET | Freq: Three times a day (TID) | ORAL | 0 refills | Status: DC | PRN

## 2021-12-13 NOTE — Telephone Encounter (Signed)
Returned call to patient and informed him that he does not have to return for labs unless he is feeling worse. He was unable to provide a swab due to nausea.

## 2021-12-13 NOTE — Patient Instructions (Addendum)
Nausea and Vomiting, Adult Nausea is the feeling that you have an upset stomach or that you are about to vomit. As nausea gets worse, it can lead to vomiting. Vomiting is when stomach contents forcefully come out of your mouth as a result of nausea. Vomiting can make you feel weak and cause you to become dehydrated. Dehydration can make you feel tired and thirsty, cause you to have a dry mouth, and decrease how often you urinate. Older adults and people with other diseases or a weak disease-fighting system (immune system) are at higher risk for dehydration. It is important to treat your nausea and vomiting as told by your health care provider. Follow these instructions at home: Watch your symptoms for any changes. Tell your health care provider about them. Eating and drinking     Take an oral rehydration solution (ORS). This is a drink that is sold at pharmacies and retail stores. Drink clear fluids slowly and in small amounts as you are able. Clear fluids include water, ice chips, low-calorie sports drinks, and fruit juice that has water added (diluted fruit juice). Eat bland, easy-to-digest foods in small amounts as you are able. These foods include bananas, applesauce, rice, lean meats, toast, and crackers. Avoid fluids that contain a lot of sugar or caffeine, such as energy drinks, sports drinks, and soda. Avoid alcohol. Avoid spicy or fatty foods. General instructions Take over-the-counter and prescription medicines only as told by your health care provider. Drink enough fluid to keep your urine pale yellow. Wash your hands often using soap and water for at least 20 seconds. If soap and water are not available, use hand sanitizer. Make sure that everyone in your household washes their hands well and often. Rest at home while you recover. Watch your condition for any changes. Take slow and deep breaths when you feel nauseous. Keep all follow-up visits. This is important. Contact a health  care provider if: Your symptoms get worse. You have new symptoms. You have a fever. You cannot drink fluids without vomiting. Your nausea does not go away after 2 days. You feel light-headed or dizzy. You have a headache. You have muscle cramps. You have a rash. You have pain while urinating. Get help right away if: You have pain in your chest, neck, arm, or jaw. You feel extremely weak or you faint. You have persistent vomiting. You have vomit that is bright red or looks like black coffee grounds. You have bloody or black stools (feces) or stools that look like tar. You have a severe headache, a stiff neck, or both. You have severe pain, cramping, or bloating in your abdomen. You have difficulty breathing, or you are breathing very quickly. Your heart is beating very quickly. Your skin feels cold and clammy. You feel confused. You have signs of dehydration, such as: Dark urine, very little urine, or no urine. Cracked lips. Dry mouth. Sunken eyes. Sleepiness. Weakness. These symptoms may be an emergency. Get help right away. Call 911. Do not wait to see if the symptoms will go away. Do not drive yourself to the hospital. Summary Nausea is the feeling that you have an upset stomach or that you are about to vomit. As nausea gets worse, it can lead to vomiting. Vomiting can make you feel weak and cause you to become dehydrated. Follow instructions from your health care provider about eating and drinking to prevent dehydration. Take over-the-counter and prescription medicines only as told by your health care provider. Contact your health care  provider if your symptoms get worse, or you have new symptoms. Keep all follow-up visits. This is important. This information is not intended to replace advice given to you by your health care provider. Make sure you discuss any questions you have with your health care provider. Document Revised: 12/18/2020 Document Reviewed:  12/18/2020 Elsevier Patient Education  2023 [employer] Viral Illness, Adult Viruses are tiny germs that can get into a person's body and cause illness. There are many different types of viruses, and they cause many types of illness. Viral illnesses can range from mild to severe. They can affect various parts of the body. Short-term conditions that are caused by a virus include colds and the flu (influenza). Long-term conditions that are caused by a virus include herpes, shingles, and HIV (human immunodeficiency virus) infection. A few viruses have been linked to certain cancers. What are the causes? Many types of viruses can cause illness. Viruses invade cells in your body, multiply, and cause the infected cells to work abnormally or die. When these cells die, they release more of the virus. When this happens, you develop symptoms of the illness, and the virus continues to spread to other cells. If the virus takes over the function of the cell, it can cause the cell to divide and grow out of control. This happens when a virus causes cancer. Different viruses get into the body in different ways. You can get a virus by: Swallowing food or water that has come in contact with the virus (is contaminated). Breathing in droplets that have been coughed or sneezed into the air by an infected person. Touching a surface that has been contaminated with the virus and then touching your eyes, nose, or mouth. Being bitten by an insect or animal that carries the virus. Having sexual contact with a person who is infected with the virus. Being exposed to blood or fluids that contain the virus, either through an open cut or during a transfusion. If a virus enters your body, your body's defense system (immune system) will try to fight the virus. You may be at higher risk for a viral illness if your immune system is weak. What are the signs or symptoms? You may have these symptoms, depending on the type of virus and  the location of the cells that it invades: Cold and flu viruses: Fever. Headache. Sore throat. Muscle aches. Stuffy nose (nasal congestion). Cough. Digestive system (gastrointestinal) viruses: Fever. Pain in the abdomen. Nausea. Diarrhea. Liver viruses (hepatitis): Loss of appetite. Tiredness. Skin or the white parts of your eyes turning yellow (jaundice). Brain and spinal cord viruses: Fever. Headache. Stiff neck. Nausea and vomiting. Confusion or sleepiness. Skin viruses: Warts. Itching. Rash. Sexually transmitted viruses: Discharge. Swelling. Redness. Rash. How is this diagnosed? This condition may be diagnosed based on one or more of the following: Symptoms. Medical history. Physical exam. Blood test, sample of mucus from your lungs (sputum sample), stool sample, or a swab of body fluids or a skin sore (lesion). How is this treated? Viruses can be hard to treat because they live within cells. Antibiotic medicines do not treat viruses because these medicines do not get inside cells. Treatment for a viral illness may include: Resting and drinking plenty of fluids. Medicines to relieve symptoms. These can include over-the-counter medicine for pain and fever, medicines for cough or congestion, and medicines to relieve diarrhea. Antiviral medicines. These medicines are available only for certain types of viruses. Some viral illnesses can be prevented with vaccinations.  A common example is the flu shot. Follow these instructions at home: Medicines Take over-the-counter and prescription medicines only as told by your health care provider. If you were prescribed an antiviral medicine, take it as told by your health care provider. Do not stop taking the antiviral even if you start to feel better. Be aware of when antibiotics are needed and when they are not needed. Antibiotics do not treat viruses. You may get an antibiotic if your health care provider thinks that you may  have, or are at risk for, a bacterial infection and you have a viral infection. Do not ask for an antibiotic prescription if you have been diagnosed with a viral illness. Antibiotics will not make your illness go away faster. Frequently taking antibiotics when they are not needed can lead to antibiotic resistance. When this develops, the medicine no longer works against the bacteria that it normally fights. General instructions  Drink enough fluids to keep your urine pale yellow. Rest as much as possible. Return to your normal activities as told by your health care provider. Ask your health care provider what activities are safe for you. Keep all follow-up visits as told by your health care provider. This is important. How is this prevented? To reduce your risk of viral illness: Wash your hands often with soap and water for at least 20 seconds. If soap and water are not available, use hand sanitizer. Avoid touching your nose, eyes, and mouth, especially if you have not washed your hands recently. If anyone in your household has a viral infection, clean all household surfaces that may have been in contact with the virus. Use soap and hot water. You may also use bleach that you have added water to (diluted). Stay away from people who are sick with symptoms of a viral infection. Do not share items such as toothbrushes and water bottles with other people. Keep your vaccinations up to date. This includes getting a yearly flu shot. Eat a healthy diet and get plenty of rest. Contact a health care provider if: You have symptoms of a viral illness that do not go away. Your symptoms come back after going away. Your symptoms get worse. Get help right away if you have: Trouble breathing. A severe headache or a stiff neck. Severe vomiting or pain in your abdomen. These symptoms may represent a serious problem that is an emergency. Do not wait to see if the symptoms will go away. Get medical help right  away. Call your local emergency services (911 in the [place] Do not drive yourself to the hospital. Summary Viruses are types of germs that can get into a person's body and cause illness. Viral illnesses can range from mild to severe. They can affect various parts of the body. Viruses can be hard to treat. There are medicines to relieve symptoms, and there are some antiviral medicines. If you were prescribed an antiviral medicine, take it as told by your health care provider. Do not stop taking the antiviral even if you start to feel better. Contact a health care provider if you have symptoms of a viral illness that do not go away. This information is not intended to replace advice given to you by your health care provider. Make sure you discuss any questions you have with your health care provider. Document Revised: 10/28/2019 Document Reviewed: 04/23/2019 Elsevier Patient Education  2023 [employer].

## 2021-12-13 NOTE — Assessment & Plan Note (Signed)
bs 111

## 2021-12-13 NOTE — Assessment & Plan Note (Signed)
F/u neg Unable to do covid because ptbegan vomiting  Will get resp panel done

## 2021-12-13 NOTE — Assessment & Plan Note (Signed)
Phenergan IM given in office zofran prn  Brat diet---- sf gatorade , pedialyte ice pops  If N/V con't -- go to ER

## 2021-12-13 NOTE — Progress Notes (Signed)
Subjective:   By signing my name below, I, Carylon Perches, attest that this documentation has been prepared under the direction and in the presence of Ann Held DO 12/13/2021     Patient ID: Barry Bush, male    DOB: Jul 13, 1960, 61 y.o.   MRN: [id number]  Chief Complaint  Patient presents with   Headache    Pt states sxs started over the weekend. Pt states headaches and nausea, chills and sweats. Negative COVID test this morning.     HPI Patient is in today for an office visit. He is accompanied by his daughter   He complains of a headache that begun on 12/10/2021. He also has associated symptoms of nausea, diarrhea,vomiting and lightheadedness. He is also experiencing sweating and freezing throughout the evening. He reports that the morning of 12/13/2021, his headache worsened. He states that currently during this visit, he has a dull headache in the back of his left head. He is currently taking Tylenol and reports that the medication is relieving symptoms. He reports that he had mild diarrhea on 12/12/2021. He does not believe his symptoms are due to food. He denies of any congestion, sore throat, nasal drainage, coughing, abdominal pain, joint pain, back pain or urinary symptoms. He also denies of troubles eating food. His daughter reports that they bought a Covid 19 test last year that they used to check for Covid. He reports that results are negative.   Past Medical History:  Diagnosis Date   Diabetes mellitus 08/26/2010   Esophageal stricture 06/27/2001   s/p dilation   Eustachian tube disorder 10/26/2014   Dr. Ilda Foil   GERD (gastroesophageal reflux disease)    Gout    Hypertension    PSORIASIS 10/16/2006   Qualifier: Diagnosis of  By: Dawson Bills      Past Surgical History:  Procedure Laterality Date   Facial Trauma      Family History  Problem Relation Age of Onset   Diabetes Mother    Diabetes Father    Heart attack Father        age 83, 2  stents   Lung cancer Other        GM   Melanoma Other        GF   Colon cancer Neg Hx    Prostate cancer Neg Hx     Social History   Socioeconomic History   Marital status: Married    Spouse name: Not on file   Number of children: 1   Years of education: Not on file   Highest education level: Not on file  Occupational History   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  Tobacco Use   Smoking status: Never   Smokeless tobacco: Never  Vaping Use   Vaping Use: Never used  Substance and Sexual Activity   Alcohol use: Yes    Comment: 3 days a week   Drug use: No   Sexual activity: Not on file  Other Topics Concern   Not on file  Social History Narrative   Household- pt and wife   Right handed    Caffeine: 2 cups/day   Social Determinants of Health   Financial Resource Strain: Not on file  Food Insecurity: Not on file  Transportation Needs: Not on file  Physical Activity: Not on file  Stress: Not on file  Social Connections: Not on file  Intimate Partner Violence: Not on file    Outpatient  Medications Prior to Visit  Medication Sig Dispense Refill   allopurinol (ZYLOPRIM) 100 MG tablet TAKE 1 TABLET (100 MG TOTAL) BY MOUTH DAILY. 90 tablet 1   amLODipine (NORVASC) 10 MG tablet Take 1 tablet (10 mg total) by mouth daily. 30 tablet 6   aspirin 81 MG tablet Take 81 mg by mouth daily.     Blood Glucose Monitoring Suppl (FREESTYLE LITE) w/Device KIT 1 Device by Does not apply route as directed. Check blood sugars twice daily 1 kit 0   empagliflozin (JARDIANCE) 25 MG TABS tablet Take 1 tablet (25 mg total) by mouth daily before breakfast. 90 tablet 1   fluticasone (FLONASE) 50 MCG/ACT nasal spray Place 2 sprays into both nostrils daily. 16 g 0   gabapentin (NEURONTIN) 800 MG tablet Take 1 tablet (800 mg total) by mouth 3 (three) times daily. 90 tablet 6   glipiZIDE (GLUCOTROL XL) 5 MG 24 hr tablet TAKE 1 TABLET BY MOUTH DAILY WITH BREAKFAST. 90  tablet 1   glucose blood (FREESTYLE LITE) test strip Check blood sugars twice daily 200 each 12   hydrochlorothiazide (HYDRODIURIL) 25 MG tablet TAKE 1 TABLET (25 MG TOTAL) BY MOUTH DAILY. 90 tablet 1   Lancets (FREESTYLE) lancets Check blood sugars twice daily 200 each 12   loratadine (CLARITIN) 10 MG tablet TAKE 1 TABLET BY MOUTH DAILY. 30 tablet 0   losartan (COZAAR) 100 MG tablet TAKE 1 TABLET (100 MG TOTAL) BY MOUTH DAILY. 90 tablet 1   metoprolol tartrate (LOPRESSOR) 100 MG tablet TAKE 1 TABLET BY MOUTH 2 TIMES DAILY. 180 tablet 1   omeprazole (PRILOSEC) 20 MG capsule Take 20 mg by mouth daily.     pioglitazone (ACTOS) 30 MG tablet TAKE 1 TABLET (30 MG TOTAL) BY MOUTH DAILY. 90 tablet 1   sildenafil (REVATIO) 20 MG tablet TAKE 4 TO 5 TABLETS BY MOUTH AT BEDTIME AS NEEDED 60 tablet 3   atorvastatin (LIPITOR) 10 MG tablet TAKE 1 TABLET (10 MG TOTAL) BY MOUTH DAILY. 90 tablet 1   metFORMIN (GLUCOPHAGE) 850 MG tablet TAKE 2 TABLETS BY MOUTH WITH BREAKFAST AND 1 TABLET BY MOUTH WITH DINNER. 270 tablet 1   No facility-administered medications prior to visit.    No Known Allergies  Review of Systems  HENT:  Negative for congestion and sore throat.        (-) Nasal Drainage  Respiratory:  Negative for cough.   Gastrointestinal:  Positive for diarrhea, nausea and vomiting. Negative for abdominal pain.  Genitourinary:  Negative for dysuria, frequency and hematuria.  Musculoskeletal:  Negative for joint pain.  Neurological:  Positive for dizziness and headaches (Back of Head).       Objective:    Physical Exam Constitutional:      General: He is not in acute distress.    Appearance: Normal appearance. He is not ill-appearing.  HENT:     Head: Normocephalic and atraumatic.     Right Ear: Tympanic membrane, ear canal and external ear normal.     Left Ear: Tympanic membrane, ear canal and external ear normal.  Eyes:     Extraocular Movements: Extraocular movements intact.     Pupils:  Pupils are equal, round, and reactive to light.  Neck:     Thyroid: No thyromegaly.  Cardiovascular:     Rate and Rhythm: Normal rate and regular rhythm.     Heart sounds: Normal heart sounds. No murmur heard.    No gallop.  Pulmonary:  Effort: Pulmonary effort is normal. No respiratory distress.     Breath sounds: Normal breath sounds. No wheezing or rales.  Abdominal:     General: Bowel sounds are normal. There is no distension.     Palpations: Abdomen is soft.     Tenderness: There is no abdominal tenderness. There is no guarding.  Lymphadenopathy:     Cervical: No cervical adenopathy.  Skin:    General: Skin is warm and dry.  Neurological:     Mental Status: He is alert and oriented to person, place, and time.  Psychiatric:        Mood and Affect: Mood normal.        Behavior: Behavior normal.        Judgment: Judgment normal.     BP 120/88 (BP Location: Left Arm, Patient Position: Sitting, Cuff Size: Normal)   Pulse 75   Temp 98.8 F (37.1 C) (Oral)   Resp 18   Ht 6' 1"  (1.854 m)   Wt 236 lb 3.2 oz (107.1 kg)   SpO2 97%   BMI 31.16 kg/m  Wt Readings from Last 3 Encounters:  12/13/21 236 lb 3.2 oz (107.1 kg)  10/07/21 239 lb 4 oz (108.5 kg)  06/08/21 238 lb (108 kg)    Diabetic Foot Exam - Simple   No data filed    Lab Results  Component Value Date   WBC 7.6 12/18/2020   HGB 12.6 (L) 12/18/2020   HCT 37.5 (L) 12/18/2020   PLT 272.0 12/18/2020   GLUCOSE 115 (H) 10/07/2021   CHOL 141 06/08/2021   TRIG 63.0 06/08/2021   HDL 62.00 06/08/2021   LDLDIRECT 138.4 03/28/2011   LDLCALC 67 06/08/2021   ALT 15 10/07/2021   AST 13 10/07/2021   NA 134 (L) 10/07/2021   K 4.6 10/07/2021   CL 97 10/07/2021   CREATININE 0.91 10/07/2021   BUN 20 10/07/2021   CO2 29 10/07/2021   TSH 2.62 08/20/2020   PSA 1.12 12/18/2020   HGBA1C 6.6 (H) 10/07/2021    Lab Results  Component Value Date   TSH 2.62 08/20/2020   Lab Results  Component Value Date   WBC 7.6  12/18/2020   HGB 12.6 (L) 12/18/2020   HCT 37.5 (L) 12/18/2020   MCV 93.6 12/18/2020   PLT 272.0 12/18/2020   Lab Results  Component Value Date   NA 134 (L) 10/07/2021   K 4.6 10/07/2021   CO2 29 10/07/2021   GLUCOSE 115 (H) 10/07/2021   BUN 20 10/07/2021   CREATININE 0.91 10/07/2021   BILITOT 0.4 10/07/2021   ALKPHOS 43 10/07/2021   AST 13 10/07/2021   ALT 15 10/07/2021   PROT 6.6 10/07/2021   ALBUMIN 4.5 10/07/2021   CALCIUM 9.4 10/07/2021   GFR 92.32 10/07/2021   Lab Results  Component Value Date   CHOL 141 06/08/2021   Lab Results  Component Value Date   HDL 62.00 06/08/2021   Lab Results  Component Value Date   LDLCALC 67 06/08/2021   Lab Results  Component Value Date   TRIG 63.0 06/08/2021   Lab Results  Component Value Date   CHOLHDL 2 06/08/2021   Lab Results  Component Value Date   HGBA1C 6.6 (H) 10/07/2021       Assessment & Plan:   Problem List Items Addressed This Visit       Unprioritized   Viral illness - Primary    F/u neg Unable to do covid because ptbegan vomiting  Will get resp panel done       Relevant Orders   CBC with Differential/Platelet   Comprehensive metabolic panel   POCT Urinalysis Dipstick (Automated)   POC Influenza A&B (Binax test) (Completed)   Nausea and vomiting    Phenergan IM given in office zofran prn  Brat diet---- sf gatorade , pedialyte ice pops  If N/V con't -- go to ER       Relevant Medications   ondansetron (ZOFRAN) 4 MG tablet   promethazine (PHENERGAN) injection 50 mg   Diabetes mellitus (HCC)    bs---  111         Meds ordered this encounter  Medications   ondansetron (ZOFRAN) 4 MG tablet    Sig: Take 1 tablet (4 mg total) by mouth every 8 (eight) hours as needed for nausea or vomiting.    Dispense:  20 tablet    Refill:  0   promethazine (PHENERGAN) injection 50 mg    I, Ann Held, DO, personally preformed the services described in this documentation.  All medical  record entries made by the scribe were at my direction and in my presence.  I have reviewed the chart and discharge instructions (if applicable) and agree that the record reflects my personal performance and is accurate and complete. 12/13/2021   I,Amber Collins,acting as a scribe for [employer], DO.,have documented all relevant documentation on the behalf of Ann Held, DO,as directed by  Ann Held, DO while in the presence of Ann Held, DO.  Ann Held, DO

## 2021-12-21 DIAGNOSIS — Z79899 Other long term (current) drug therapy: Secondary | ICD-10-CM | POA: Diagnosis not present

## 2021-12-21 DIAGNOSIS — L409 Psoriasis, unspecified: Secondary | ICD-10-CM | POA: Diagnosis not present

## 2021-12-24 ENCOUNTER — Other Ambulatory Visit: Payer: Self-pay | Admitting: Internal Medicine

## 2022-01-06 ENCOUNTER — Encounter: Payer: Self-pay | Admitting: Internal Medicine

## 2022-01-06 ENCOUNTER — Ambulatory Visit (HOSPITAL_BASED_OUTPATIENT_CLINIC_OR_DEPARTMENT_OTHER)
Admission: RE | Admit: 2022-01-06 | Discharge: 2022-01-06 | Disposition: A | Discharge status: Home / Self Care | Payer: BC Managed Care – PPO | Source: Ambulatory Visit | Attending: Internal Medicine | Admitting: Internal Medicine

## 2022-01-06 ENCOUNTER — Ambulatory Visit: Payer: BC Managed Care – PPO | Admitting: Internal Medicine

## 2022-01-06 VITALS — BP 122/68 | HR 60 | Temp 98.3°F | Resp 18 | Ht 73.0 in | Wt 239.4 lb

## 2022-01-06 DIAGNOSIS — B349 Viral infection, unspecified: Secondary | ICD-10-CM

## 2022-01-06 DIAGNOSIS — R5383 Other fatigue: Secondary | ICD-10-CM | POA: Diagnosis not present

## 2022-01-06 NOTE — Assessment & Plan Note (Signed)
Viral syndrome: Diagnosed with viral syndrome approximately 3 weeks ago, COVID and flu test negative.  He improved temporarily but then started to feel unwell again "like a hangover". ROS otherwise benign.  Nontoxic-appearing, vital signs stable.  Ambulatory CBGs not low (127 today).  No recent ambulatory BPs Postviral syndrome?  Viral pneumonia? Plan: Chest x-ray for completeness, continue good hydration and routine meds, watch for low BPs and CBGs.  Call next week if not gradually better.

## 2022-01-06 NOTE — Progress Notes (Signed)
Subjective:    Patient ID: Barry Bush, male    DOB: [birth year], 61 y.o.   MRN: [id number]  DOS:  01/06/2022 Type of visit - description: Acute  Was seen 12/13/2021, Dx with viral syndrome. Work-up: CBC normal, CMP showed mild hyponatremia, urinalysis showed some protein.  Influenza test negative  Shortly after he improved however by July 2 he started feeling unwell again: Symptoms are described as a "hangover": Generalized aches, backache, very tired. He went on vacation the week of July 4 and he continued feeling the same way.  No alcohol.  Today he is here with persistent symptoms , feeling about the same.  No fever chills No sneezing, sinus pain or congestion No chest pain, shortness of breath or DOE. No edema No nausea vomiting diarrhea or blood in the stools. No cough He is drinking plenty of fluids. No dysuria or gross hematuria. No headache, diplopia or slurred speech. Very seldom felt slightly dizzy  Review of Systems See above   Past Medical History:  Diagnosis Date   Diabetes mellitus 08/26/2010   Esophageal stricture 06/27/2001   s/p dilation   Eustachian tube disorder 10/26/2014   Dr. Ilda Foil   GERD (gastroesophageal reflux disease)    Gout    Hypertension    PSORIASIS 10/16/2006   Qualifier: Diagnosis of  By: Dawson Bills      Past Surgical History:  Procedure Laterality Date   Facial Trauma      Current Outpatient Medications  Medication Instructions   allopurinol (ZYLOPRIM) 100 mg, Oral, Daily   amLODipine (NORVASC) 10 mg, Oral, Daily   aspirin 81 mg, Daily   atorvastatin (LIPITOR) 10 mg, Oral, Daily   Blood Glucose Monitoring Suppl (FREESTYLE LITE) w/Device KIT 1 Device, Does not apply, As directed, Check blood sugars twice daily   empagliflozin (JARDIANCE) 25 mg, Oral, Daily before breakfast   fluticasone (FLONASE) 50 MCG/ACT nasal spray 2 sprays, Each Nare, Daily   gabapentin (NEURONTIN) 800 mg, Oral, 3 times daily   glipiZIDE  (GLUCOTROL XL) 5 MG 24 hr tablet TAKE 1 TABLET BY MOUTH DAILY WITH BREAKFAST.   glucose blood (FREESTYLE LITE) test strip Check blood sugars twice daily   hydrochlorothiazide (HYDRODIURIL) 25 mg, Oral, Daily   Lancets (FREESTYLE) lancets Check blood sugars twice daily   loratadine (CLARITIN) 10 MG tablet TAKE 1 TABLET BY MOUTH DAILY.   losartan (COZAAR) 100 mg, Oral, Daily   metFORMIN (GLUCOPHAGE) 850 MG tablet TAKE 2 TABLETS BY MOUTH WITH BREAKFAST AND 1 TABLET BY MOUTH WITH DINNER.   metoprolol tartrate (LOPRESSOR) 100 MG tablet TAKE 1 TABLET BY MOUTH 2 TIMES DAILY.   omeprazole (PRILOSEC) 20 mg, Oral, Daily,     ondansetron (ZOFRAN) 4 mg, Oral, Every 8 hours PRN   pioglitazone (ACTOS) 30 mg, Oral, Daily   Risankizumab-rzaa (SKYRIZI) 150 MG/ML SOSY Take as directed   sildenafil (REVATIO) 20 MG tablet TAKE 4 TO 5 TABLETS BY MOUTH AT BEDTIME AS NEEDED       Objective:   Physical Exam BP 122/68   Pulse 60   Temp 98.3 F (36.8 C) (Oral)   Resp 18   Ht _0  (1.854 m)   Wt 239 lb 6 oz (108.6 kg)   SpO2 96%   BMI 31.58 kg/m  General:   Well developed, NAD, BMI noted.  HEENT:  Normocephalic . Face symmetric, atraumatic Lungs:  CTA B Normal respiratory effort, no intercostal retractions, no accessory muscle use. Heart: RRR,  no murmur.  Abdomen:  Not distended, soft, non-tender. No rebound or rigidity.   Skin: Not pale. Not jaundice Lower extremities: no pretibial edema bilaterally  Neurologic:  alert & oriented X3.  Speech normal, gait appropriate for age and unassisted Psych--  Cognition and judgment appear intact.  Cooperative with normal attention span and concentration.  Behavior appropriate. No anxious or depressed appearing.     Assessment     Assessment DM  dx 2012 Mild neuropathy dx 10-2015, saw neuro, 08-2020, extensive blood work (-) NCS confirmed mild neuropathy, likely DM related. HTN GERD Esophageal stricture, dilatation 2003 Gout Psoriasis   Eustachian  tube disorder, saw ENT 2016 B12 deficiency DX 02-2018 + FH CAD  PLAN: Viral syndrome: Diagnosed with viral syndrome approximately 3 weeks ago, COVID and flu test negative.  He improved temporarily but then started to feel unwell again "like a hangover". ROS otherwise benign.  Nontoxic-appearing, vital signs stable.  Ambulatory CBGs not low (127 today).  No recent ambulatory BPs Postviral syndrome?  Viral pneumonia? Plan: Chest x-ray for completeness, continue good hydration and routine meds, watch for low BPs and CBGs.  Call next week if not gradually better.

## 2022-01-06 NOTE — Patient Instructions (Signed)
Get a chest x-ray  Continue drinking plenty of fluids  Check your blood pressures blood sugars to be sure they are not low.  Call next week if you are not better. Call anytime if you have severe symptoms

## 2022-01-19 ENCOUNTER — Other Ambulatory Visit: Payer: Self-pay | Admitting: Internal Medicine

## 2022-01-25 ENCOUNTER — Other Ambulatory Visit: Payer: Self-pay | Admitting: Internal Medicine

## 2022-01-31 ENCOUNTER — Telehealth: Payer: Self-pay | Admitting: Internal Medicine

## 2022-01-31 NOTE — Telephone Encounter (Signed)
FYI- Pt having right sided numbness, he was triaged to EMS 911- Pt refused, and wanted to schedule visit. Appt has been scheduled for 02/02/22.

## 2022-01-31 NOTE — Telephone Encounter (Signed)
Nurse Assessment Nurse: Jayme Cloud, RN, Luis Date/Time (Eastern Time): 01/31/2022 4:08:24 PM Confirm and document reason for call. If symptomatic, describe symptoms. ---Caller states his right arm has numbness and tingling, from wrist to elbow, onset of symptoms1 week prior. Reports having diabetes, states current symptoms may be due to neuropathy. Denies any weakness on RIGHT side, denies facial symptoms. Does the patient have any new or worsening symptoms? ---Yes Will a triage be completed? ---Yes Related visit to physician within the last 2 weeks? ---No Does the PT have any chronic conditions? (i.e. diabetes, asthma, this includes High risk factors for pregnancy, etc.) ---Yes List chronic conditions. ---DM HTN Gout Cholesterol Is this a behavioral health or substance abuse call? ---No Guidelines Guideline Title Affirmed Question Affirmed Notes Nurse Date/Time (Eastern Time) Neurologic Deficit [1] Numbness (i.e., loss of sensation) of the face, arm / hand, or leg / foot on one side of the body AND [2] sudden onset AND [3] present now [name] 01/31/2022 4:12:28 PM PLEASE NOTE: All timestamps contained within this report are represented as [place] Standard Time. CONFIDENTIALTY NOTICE: This fax transmission is intended only for the addressee. It contains information that is legally privileged, confidential or otherwise protected from use or disclosure. If you are not the intended recipient, you are strictly prohibited from reviewing, disclosing, copying using or disseminating any of this information or taking any action in reliance on or regarding this information. If you have received this fax in error, please notify us immediately by telephone so that we can arrange for its return to [place]. Phone: [phone], Toll-Free: [phone], Fax: [phone] Page: 2 of 3 Call Id: [id number] Disp. Time Barry Bush Time) Disposition Final User 01/31/2022 4:06:57 PM Send to Urgent [name], [place] 01/31/2022 4:18:09 PM Call EMS 911 Now Yes [name] 01/31/2022 4:34:42 PM 911 Outcome Documentation Jayme Cloud, RN, [name] Reason: Patient refused EMS services, primary clinic made aware as per directives Final Disposition 01/31/2022 4:18:09 PM Call EMS 911 Now Yes Jayme Cloud, RN, Trinna Balloon Disagree/Comply Disagree Caller Understands Yes PreDisposition Did not know what to do Care Advice Given Per Guideline CALL EMS 911 NOW: * Immediate medical attention is needed. You need to hang up and call 911 (or an ambulance). * Triager Discretion: I'll call you back in a few minutes to be sure you were able to reach them. CARE ADVICE given per Neurologic Deficit (Adult) guideline. Comments User: Laurene Footman, RN Date/Time Barry Bush Time): 01/31/2022 4:13:59 PM states symptoms onset 1-2 weeks prior. User: Laurene Footman, RN Date/Time Barry Bush Time): 01/31/2022 4:17:52 PM patient refusing to call 911/EMS, this writer strongly advised patient to go, verbalized understanding of information provided. User: Laurene Footman, RN Date/Time Barry Bush Time): 01/31/2022 4:19:09 PM will call backline at [phone] and inform of patients EMS outcome as directives indicate User: Laurene Footman, RN Date/Time Barry Bush Time): 01/31/2022 4:20:23 PM [phone], called and states "call can not be completed as dialed" will attempt calling office at this time User: Laurene Footman, RN Date/Time Barry Bush Time): 01/31/2022 4:21:18 PM on hold with office at this time User: Laurene Footman, RN Date/Time Barry Bush Time): 01/31/2022 4:31:44 PM Spoke with Orpha Bur at [facility], reported information to her and she states she will pass that information along to a provider. User: Laurene Footman, RN Date/Time Barry Bush Time): 01/31/2022 4:33:09 PM patient informed to expect call back from clinic, verbalized understanding

## 2022-01-31 NOTE — Telephone Encounter (Signed)
Pt stated he is experiencing r arm numbness/tingling with on and off fatigue. Transferred to triage.

## 2022-02-01 NOTE — Telephone Encounter (Signed)
Noted  

## 2022-02-02 ENCOUNTER — Encounter: Payer: Self-pay | Admitting: Internal Medicine

## 2022-02-02 ENCOUNTER — Ambulatory Visit: Payer: BC Managed Care – PPO | Admitting: Internal Medicine

## 2022-02-02 VITALS — BP 108/60 | HR 68 | Temp 98.2°F | Resp 18 | Ht 73.0 in | Wt 233.5 lb

## 2022-02-02 DIAGNOSIS — I10 Essential (primary) hypertension: Secondary | ICD-10-CM

## 2022-02-02 DIAGNOSIS — R5383 Other fatigue: Secondary | ICD-10-CM | POA: Diagnosis not present

## 2022-02-02 DIAGNOSIS — G629 Polyneuropathy, unspecified: Secondary | ICD-10-CM | POA: Diagnosis not present

## 2022-02-02 DIAGNOSIS — E871 Hypo-osmolality and hyponatremia: Secondary | ICD-10-CM

## 2022-02-02 LAB — BASIC METABOLIC PANEL
BUN: 17 mg/dL (ref 6–23)
CO2: 26 mEq/L (ref 19–32)
Calcium: 9.3 mg/dL (ref 8.4–10.5)
Chloride: 101 mEq/L (ref 96–112)
Creatinine, Ser: 0.94 mg/dL (ref 0.40–1.50)
GFR: 88.6 mL/min (ref >60.00)
Glucose, Bld: 98 mg/dL (ref 70–99)
Potassium: 4.3 mEq/L (ref 3.5–5.1)
Sodium: 137 mEq/L (ref 135–145)

## 2022-02-02 LAB — TSH: TSH: 3.22 u[IU]/mL (ref 0.35–5.50)

## 2022-02-02 LAB — B12 AND FOLATE PANEL
Folate: 17.7 ng/mL (ref >5.9)
Vitamin B-12: 546 pg/mL (ref 211–911)

## 2022-02-02 NOTE — Patient Instructions (Signed)
Check the  blood pressure regularly BP GOAL is between 110/65 and  135/85. If it is consistently higher or lower, let me know    GO TO THE LAB : Get the blood work     GO TO THE FRONT DESK, PLEASE SCHEDULE YOUR APPOINTMENTS Come back for a checkup in 6 weeks  Call anytime if severe symptoms or you develop: Major headaches, visual disturbances, multiple other areas of numbness

## 2022-02-02 NOTE — Assessment & Plan Note (Signed)
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  No injury, no  CNS sxs He also has developed some numbness at the L hip. No know tick bites  Labs: B12, folic acid, Lyme serology, TSH Has an appointment to see neurology for feet numbness, encouraged to discuss this new issue with them. If symptoms increase let me know, see AVS HTN: Well-controlled, BP is slightly low today, recommend to monitor BPs.  Check a BMP, recent hyponatremia noted. Fatigue: Had a viral syndrome early in July, all symptoms gone except for  fatigue.  Recommend to monitor BPs, checking labs. RTC 6 weeks.

## 2022-02-02 NOTE — Progress Notes (Signed)
Subjective:    Patient ID: Barry Bush, male    DOB: 1961-05-08, 61 y.o.   MRN: [id number]  DOS:  02/02/2022 Type of visit - description: acute   Symptoms started 2 weeks ago: Report numbness in the R arm at a very specific location. No pain, no motor deficits.  No known injury.  No neck pain.  No rash.  Today, he had somewhat similar symptoms (numbness) at the lateral aspect of the left hip.  Also had a viral syndrome early July, all symptoms are gone except fatigue in the afternoon.   Review of Systems Denies nausea vomiting. Had a couple of headaches. Dizzy only if he gets up fast. No visual disturbances. No slurred speech, no facial weakness.  Past Medical History:  Diagnosis Date   Diabetes mellitus 08/26/2010   Esophageal stricture 06/27/2001   s/p dilation   Eustachian tube disorder 10/26/2014   Dr. Ilda Foil   GERD (gastroesophageal reflux disease)    Gout    Hypertension    PSORIASIS 10/16/2006   Qualifier: Diagnosis of  By: Dawson Bills      Past Surgical History:  Procedure Laterality Date   Facial Trauma      Current Outpatient Medications  Medication Instructions   allopurinol (ZYLOPRIM) 100 mg, Oral, Daily   amLODipine (NORVASC) 10 mg, Oral, Daily   aspirin 81 mg, Daily   atorvastatin (LIPITOR) 10 mg, Oral, Daily   Blood Glucose Monitoring Suppl (FREESTYLE LITE) w/Device KIT 1 Device, Does not apply, As directed, Check blood sugars twice daily   empagliflozin (JARDIANCE) 25 mg, Oral, Daily before breakfast   fluticasone (FLONASE) 50 MCG/ACT nasal spray 2 sprays, Each Nare, Daily   gabapentin (NEURONTIN) 800 mg, Oral, 3 times daily   glipiZIDE (GLUCOTROL XL) 5 MG 24 hr tablet TAKE 1 TABLET BY MOUTH DAILY WITH BREAKFAST.   glucose blood (FREESTYLE LITE) test strip Check blood sugars twice daily   hydrochlorothiazide (HYDRODIURIL) 25 mg, Oral, Daily   Lancets (FREESTYLE) lancets Check blood sugars twice daily   loratadine (CLARITIN) 10 MG  tablet TAKE 1 TABLET BY MOUTH DAILY.   losartan (COZAAR) 100 mg, Oral, Daily   metFORMIN (GLUCOPHAGE) 850 MG tablet TAKE 2 TABLETS BY MOUTH WITH BREAKFAST AND 1 TABLET BY MOUTH WITH DINNER.   metoprolol tartrate (LOPRESSOR) 100 MG tablet TAKE 1 TABLET BY MOUTH 2 TIMES DAILY.   omeprazole (PRILOSEC) 20 mg, Oral, Daily,     ondansetron (ZOFRAN) 4 mg, Oral, Every 8 hours PRN   pioglitazone (ACTOS) 30 mg, Oral, Daily   Risankizumab-rzaa (SKYRIZI) 150 MG/ML SOSY Take as directed   sildenafil (REVATIO) 20 MG tablet TAKE 4 TO 5 TABLETS BY MOUTH AT BEDTIME AS NEEDED       Objective:   Physical Exam Musculoskeletal:       Arms:    BP 108/60   Pulse 68   Temp 98.2 F (36.8 C) (Oral)   Resp 18   Ht 6' 1" (1.854 m)   Wt 233 lb 8 oz (105.9 kg)   SpO2 96%   BMI 30.81 kg/m  General:   Well developed, NAD, BMI noted. HEENT:  Normocephalic . Face symmetric, atraumatic Neck: Range of motion normal, no cervical TTP Lungs:  CTA B Normal respiratory effort, no intercostal retractions, no accessory muscle use. Heart: RRR,  no murmur.  Lower extremities: no pretibial edema bilaterally  Skin: Not pale. Not jaundice Neurologic:  alert & oriented X3.  Speech normal, gait appropriate for age and unassisted.  Motor and DTR symmetric. Area of numbness: See graphic Psych--  Cognition and judgment appear intact.  Cooperative with normal attention span and concentration.  Behavior appropriate. No anxious or depressed appearing.      Assessment     Assessment DM  dx 2012 Mild neuropathy dx 10-2015, saw neuro, 08-2020, extensive blood work (-) NCS confirmed mild neuropathy, likely DM related. HTN GERD Esophageal stricture, dilatation 2003 Gout Psoriasis  Eustachian  tube disorder, saw ENT 2016 B12 deficiency DX 02-2018 + FH CAD  PLAN:  R lateral antebrachial cutaneous nerve pathology: New problem The patient numbness coincide with the above nerve territory.  No injury, no  CNS sxs He  also has developed some numbness at the L hip. No know tick bites  Labs: B12, folic acid, Lyme serology, TSH Has an appointment to see neurology for feet numbness, encouraged to discuss this new issue with them. If symptoms increase let me know, see AVS HTN: Well-controlled, BP is slightly low today, recommend to monitor BPs.  Check a BMP, recent hyponatremia noted. Fatigue: Had a viral syndrome early in July, all symptoms gone except for  fatigue.  Recommend to monitor BPs, checking labs. RTC 6 weeks.    

## 2022-02-04 LAB — B. BURGDORFI ANTIBODIES BY WB
B burgdorferi IgG Abs (IB): POSITIVE — AB
B burgdorferi IgM Abs (IB): POSITIVE — AB
Lyme Disease 18 kD IgG: REACTIVE — AB
Lyme Disease 23 kD IgG: NONREACTIVE
Lyme Disease 23 kD IgM: REACTIVE — AB
Lyme Disease 28 kD IgG: NONREACTIVE
Lyme Disease 30 kD IgG: NONREACTIVE
Lyme Disease 39 kD IgG: REACTIVE — AB
Lyme Disease 39 kD IgM: REACTIVE — AB
Lyme Disease 41 kD IgG: REACTIVE — AB
Lyme Disease 41 kD IgM: REACTIVE — AB
Lyme Disease 45 kD IgG: NONREACTIVE
Lyme Disease 58 kD IgG: REACTIVE — AB
Lyme Disease 66 kD IgG: REACTIVE — AB
Lyme Disease 93 kD IgG: REACTIVE — AB

## 2022-02-04 LAB — LYME AB SCREEN RFLX: Lyme AB Screen: 8.05 index — ABNORMAL HIGH

## 2022-02-07 ENCOUNTER — Other Ambulatory Visit: Payer: Self-pay

## 2022-02-07 DIAGNOSIS — A6920 Lyme disease, unspecified: Secondary | ICD-10-CM

## 2022-02-07 MED ORDER — DOXYCYCLINE HYCLATE 100 MG PO TABS: 100.0000 mg | ORAL_TABLET | Freq: Two times a day (BID) | ORAL | 0 refills | Status: DC

## 2022-02-07 NOTE — Addendum Note (Signed)
Addended byConrad Minot D on: 02/07/2022 02:07 PM   Modules accepted: Orders

## 2022-02-10 ENCOUNTER — Ambulatory Visit: Payer: BC Managed Care – PPO | Admitting: Internal Medicine

## 2022-02-24 ENCOUNTER — Other Ambulatory Visit: Payer: Self-pay | Admitting: Internal Medicine

## 2022-03-17 ENCOUNTER — Encounter: Payer: Self-pay | Admitting: Internal Medicine

## 2022-03-17 ENCOUNTER — Other Ambulatory Visit (INDEPENDENT_AMBULATORY_CARE_PROVIDER_SITE_OTHER): Payer: BC Managed Care – PPO

## 2022-03-17 ENCOUNTER — Ambulatory Visit: Payer: BC Managed Care – PPO | Admitting: Internal Medicine

## 2022-03-17 VITALS — BP 122/66 | HR 66 | Temp 98.1°F | Resp 16 | Ht 73.0 in | Wt 236.0 lb

## 2022-03-17 DIAGNOSIS — A6920 Lyme disease, unspecified: Secondary | ICD-10-CM

## 2022-03-17 DIAGNOSIS — E1140 Type 2 diabetes mellitus with diabetic neuropathy, unspecified: Secondary | ICD-10-CM | POA: Diagnosis not present

## 2022-03-17 DIAGNOSIS — Z23 Encounter for immunization: Secondary | ICD-10-CM | POA: Diagnosis not present

## 2022-03-17 NOTE — Patient Instructions (Addendum)
Vaccines I recommend:  Covid booster    GO TO THE FRONT DESK, PLEASE SCHEDULE YOUR APPOINTMENTS Come back for  a physical exam by 05-2022

## 2022-03-17 NOTE — Progress Notes (Signed)
Subjective:    Patient ID: Barry Bush, male    DOB: [birth year], 61 y.o.   MRN: [id number]  DOS:  03/17/2022 Type of visit - description: f/u  See LOV, had paresthesias.  Also fatigue. Work-up included a Lyme serology, it came back positive. He was referred to infectious diseases and started on doxycycline. Shortly after the antibiotics he started to feel much better, fatigue resolved. He is still have some paresthesias at the right arm and to a lesser degree also have paresthesias on the left arm symmetrically.  Review of Systems Denies headache, fever.   Past Medical History:  Diagnosis Date   Diabetes mellitus 08/26/2010   Esophageal stricture 06/27/2001   s/p dilation   Eustachian tube disorder 10/26/2014   Dr. Ilda Foil   GERD (gastroesophageal reflux disease)    Gout    Hypertension    PSORIASIS 10/16/2006   Qualifier: Diagnosis of  By: Dawson Bills      Past Surgical History:  Procedure Laterality Date   Facial Trauma      Current Outpatient Medications  Medication Instructions   allopurinol (ZYLOPRIM) 100 mg, Oral, Daily   amLODipine (NORVASC) 10 mg, Oral, Daily   aspirin 81 mg, Daily   atorvastatin (LIPITOR) 10 mg, Oral, Daily   Blood Glucose Monitoring Suppl (FREESTYLE LITE) w/Device KIT 1 Device, Does not apply, As directed, Check blood sugars twice daily   doxycycline (VIBRA-TABS) 100 mg, Oral, 2 times daily   empagliflozin (JARDIANCE) 25 mg, Oral, Daily before breakfast   fluticasone (FLONASE) 50 MCG/ACT nasal spray 2 sprays, Each Nare, Daily   gabapentin (NEURONTIN) 800 mg, Oral, 3 times daily   glipiZIDE (GLUCOTROL XL) 5 MG 24 hr tablet TAKE 1 TABLET BY MOUTH DAILY WITH BREAKFAST.   glucose blood (FREESTYLE LITE) test strip Check blood sugars twice daily   hydrochlorothiazide (HYDRODIURIL) 25 mg, Oral, Daily   Lancets (FREESTYLE) lancets Check blood sugars twice daily   loratadine (CLARITIN) 10 MG tablet TAKE 1 TABLET BY MOUTH DAILY.    losartan (COZAAR) 100 mg, Oral, Daily   metFORMIN (GLUCOPHAGE) 850 MG tablet TAKE 2 TABLETS BY MOUTH WITH BREAKFAST AND 1 TABLET BY MOUTH WITH DINNER.   metoprolol tartrate (LOPRESSOR) 100 MG tablet TAKE 1 TABLET BY MOUTH 2 TIMES DAILY.   omeprazole (PRILOSEC) 20 mg, Oral, Daily,     ondansetron (ZOFRAN) 4 mg, Oral, Every 8 hours PRN   pioglitazone (ACTOS) 30 mg, Oral, Daily   Risankizumab-rzaa (SKYRIZI) 150 MG/ML SOSY Take as directed   sildenafil (REVATIO) 20 MG tablet TAKE 4 TO 5 TABLETS BY MOUTH AT BEDTIME AS NEEDED       Objective:   Physical Exam BP 122/66   Pulse 66   Temp 98.1 F (36.7 C) (Oral)   Resp 16   Ht 6' 1" (1.854 m)   Wt 236 lb (107 kg)   SpO2 98%   BMI 31.14 kg/m  General:   Well developed, NAD, BMI noted. HEENT:  Normocephalic . Face symmetric, atraumatic  Lower extremities: no pretibial edema bilaterally  Skin: Not pale. Not jaundice Neurologic:  alert & oriented X3.  Speech normal, gait appropriate for age and unassisted Motor and DTR symmetric Psych--  Cognition and judgment appear intact.  Cooperative with normal attention span and concentration.  Behavior appropriate. No anxious or depressed appearing.      Assessment     Assessment DM  dx 2012 Mild neuropathy dx 10-2015, saw neuro, 08-2020, extensive blood work (-) NCS confirmed mild  neuropathy, likely DM related. HTN GERD Esophageal stricture, dilatation 2003 Gout Psoriasis  Eustachian  tube disorder, saw ENT 2016 B12 deficiency DX 02-2018 + FH CAD  PLAN:  R lateral antebrachial cutaneous nerve pathology: See LOV, had R arm paresthesias and fatigue, work-up was done, Lyme serology positive, I referred to ID, in the meantime he was started on doxycycline. Patient  tells me that he got a phone call from the ID clinic and was told that no appointment was necessary and continue doxycycline. Since then, fatigue is resolved, still has some paresthesias. Plan: Keep appointment with neurology  for a follow-up of well-known neuropathy and to discuss this new right arm paresthesia. + Lyme serology: See above, treated with doxycycline. DM: Reports ambulatory CBGs in the 120s, continue Jardiance, Glucotrol, metformin, Actos.  Check A1c. Flu shot today RTC 05/2022 CPX

## 2022-03-18 NOTE — Assessment & Plan Note (Signed)
R lateral antebrachial cutaneous nerve pathology: See LOV, had R arm paresthesias and fatigue, work-up was done, Lyme serology positive, I referred to ID, in the meantime he was started on doxycycline. Patient  tells me that he got a phone call from the ID clinic and was told that no appointment was necessary and continue doxycycline. Since then, fatigue is resolved, still has some paresthesias. Plan: Keep appointment with neurology for a follow-up of well-known neuropathy and to discuss this new right arm paresthesia. + Lyme serology: See above, treated with doxycycline. DM: Reports ambulatory CBGs in the 120s, continue Jardiance, Glucotrol, metformin, Actos.  Check A1c. Flu shot today RTC 05/2022 CPX

## 2022-03-21 LAB — HEMOGLOBIN A1C: Hgb A1c MFr Bld: 6.7 % — ABNORMAL HIGH (ref 4.6–6.5)

## 2022-03-24 NOTE — Patient Instructions (Addendum)
Below is our plan:  We will gabapentin 800mg  three times daily. Consider alpha lipoic acid over the counter. I will send a referral to Dr Leeroy Cha with [facility] to discuss Qutenza treatments.   Please make sure you are staying well hydrated. I recommend 50-60 ounces daily. Well balanced diet and regular exercise encouraged. Consistent sleep schedule with 6-8 hours recommended.   Please continue follow up with care team as directed.   Follow up with me in 6 months   You may receive a survey regarding today's visit. I encourage you to leave honest feed back as I do use this information to improve patient care. Thank you for seeing me today!    Alpha-lipoic acid (ALA) - For patients who are refractory to or intolerant of first-line pharmacotherapies and interested in a nutritional supplement approach, we suggest a trial of oral ALA (600 mg daily). ALA is an antioxidant with the potential to diminish oxidative stress, improve the underlying pathophysiology of neuropathy, and reduce pain. (See "Pathogenesis of diabetic polyneuropathy".) Several prospective, placebo-controlled trials have examined ALA (either intravenous or oral) in patients with painful diabetic neuropathy [[id number]]. In an initial trial, daily intravenous ALA for three weeks was associated with reduced pain, paresthesia, and numbness compared with placebo infusions [62]. In a second trial, oral ALA (600, 1200, or 1800 mg daily) versus placebo was studied in 181 patients with diabetes and symptomatic distal symmetric polyneuropathy (DSPN) [64]. All three doses of oral ALA treatment were associated with a reduction in the neuropathy total symptom score (a summation of stabbing pain, burning pain, paresthesia, and asleep numbness) compared with placebo. The benefit of ALA did not differ by dose. A ?50 percent reduction in neuropathic symptoms was observed in 50 to 62 percent of patients treated with ALA versus 26 percent with  placebo. Doses higher than 600 mg daily were limited by increasing adverse events (nausea, vomiting, and vertigo) without increasing efficacy. Confidence in the findings is limited by the short duration of this trial. There are no long-term studies that assess the effect of ALA on progression of neuropathy.

## 2022-03-24 NOTE — Progress Notes (Signed)
Chief Complaint  Patient presents with   Follow-up    Pt in room #1 and alone. Pt here today for f/u.    HISTORY OF PRESENT ILLNESS:  03/28/22 ALL:  Barry Bush is a 61 y.o. male here today for follow up for diabetic neuropathy. He was last seen by Dr Rexene Alberts and doing well on gabapentin 837m BID written by PCP. Since, he reports dose was increased to 8056mTID. He is doing failry well. No significant worsening. He continues to have burning/stinging pain in both feet. He remains active. He is wearing diabetic socks and shoes. He alternates between 4 pairs of diabetic shoes. He does feel this helps. He does note,, over the past few months that he has decreased sensation to the radial area of wrists, bilaterally, that runs to mid forearm. No radiation to hand or fingers. No weakness noted. Last A1C was 6.7. He owns a tiProgrammer, applicationsNo obvious injuries.    HISTORY (copied from Dr AtGuadelupe Sabinrevious note)  Barry Bush with an underlying medical history of hypertension, reflux disease, gout, psoriasis, diabetes and obesity, who presents for follow-up consultation of his neuropathy.  The patient is unaccompanied today.  I first met him at the request of his primary care physician on 08/27/2020, at which time he reported a several year history of approximately 6 to 7 years of paresthesias affecting his feet.  He had had recent blood work through his primary care physician.  Examination showed decreased sensation to pinprick and vibration in the distal lower extremities bilaterally.  He was on gabapentin which had been started by his primary care physician and he was on 600 mg twice daily at the time.  A1c has been below 7, he had a positive ANA and was advised to talk to his primary care physician about further evaluation for this.  I suggested we proceed with EMG and nerve conduction velocity testing as well as additional blood work through our office.  He  was advised to continue with gabapentin.  He was advised to consider seeing dermatology for his psoriasis which had become worse.     Blood work from 08/27/2020 included multiple myeloma panel, vitamin B1, vitamin B6, RPR, ESR, heavy metal profile, vitamin D.  Labs were benign and he was notified.   He had and EMG/NCV test through our office on 09/09/20 and I reviewed the results:    Conclusion: This is an abnormal study.  There is electrodiagnostic evidence of mild length dependent axonal neuropathy, mainly involving sensory component.   We called him with his test results.    Today, 12/03/20: He reports feeling stable.  His primary care physician increased the gabapentin to 800 mg twice daily.  He reports doing well with that and it is helpful.  He has not had any major side effects from it.  Symptoms are about the same, he has not had any recent falls.  He did see dermatology in April and is waiting to start Stelara, previously was on [place] good success but insurance did not cover it any longer.   REVIEW OF SYSTEMS: Out of a complete 14 system review of symptoms, the patient complains only of the following symptoms, numbness, burning, tingling of bilateral feet, numbness of wrists bilaterally and all other reviewed systems are negative.   ALLERGIES: No Known Allergies   HOME MEDICATIONS: Outpatient Medications Prior to Visit  Medication Sig Dispense Refill   allopurinol (ZYLOPRIM) 100 MG tablet  TAKE 1 TABLET (100 MG TOTAL) BY MOUTH DAILY. 90 tablet 1   amLODipine (NORVASC) 10 MG tablet Take 1 tablet (10 mg total) by mouth daily. 30 tablet 6   aspirin 81 MG tablet Take 81 mg by mouth daily.     atorvastatin (LIPITOR) 10 MG tablet TAKE 1 TABLET (10 MG TOTAL) BY MOUTH DAILY. 90 tablet 1   Blood Glucose Monitoring Suppl (FREESTYLE LITE) w/Device KIT 1 Device by Does not apply route as directed. Check blood sugars twice daily 1 kit 0   empagliflozin (JARDIANCE) 25 MG TABS tablet Take 1  tablet (25 mg total) by mouth daily before breakfast. 90 tablet 1   fluticasone (FLONASE) 50 MCG/ACT nasal spray Place 2 sprays into both nostrils daily. 16 g 0   gabapentin (NEURONTIN) 800 MG tablet Take 1 tablet (800 mg total) by mouth 3 (three) times daily. 270 tablet 1   glipiZIDE (GLUCOTROL XL) 5 MG 24 hr tablet TAKE 1 TABLET BY MOUTH DAILY WITH BREAKFAST. 90 tablet 1   glucose blood (FREESTYLE LITE) test strip Check blood sugars twice daily 200 each 12   hydrochlorothiazide (HYDRODIURIL) 25 MG tablet TAKE 1 TABLET (25 MG TOTAL) BY MOUTH DAILY. 90 tablet 1   Lancets (FREESTYLE) lancets Check blood sugars twice daily 200 each 12   loratadine (CLARITIN) 10 MG tablet TAKE 1 TABLET BY MOUTH DAILY. 30 tablet 0   losartan (COZAAR) 100 MG tablet TAKE 1 TABLET (100 MG TOTAL) BY MOUTH DAILY. 90 tablet 1   metFORMIN (GLUCOPHAGE) 850 MG tablet TAKE 2 TABLETS BY MOUTH WITH BREAKFAST AND 1 TABLET BY MOUTH WITH DINNER. 270 tablet 1   metoprolol tartrate (LOPRESSOR) 100 MG tablet TAKE 1 TABLET BY MOUTH 2 TIMES DAILY. 180 tablet 1   omeprazole (PRILOSEC) 20 MG capsule Take 20 mg by mouth daily.     ondansetron (ZOFRAN) 4 MG tablet Take 1 tablet (4 mg total) by mouth every 8 (eight) hours as needed for nausea or vomiting. 20 tablet 0   pioglitazone (ACTOS) 30 MG tablet Take 1 tablet (30 mg total) by mouth daily. 90 tablet 1   Risankizumab-rzaa (SKYRIZI) 150 MG/ML SOSY Take as directed     sildenafil (REVATIO) 20 MG tablet TAKE 4 TO 5 TABLETS BY MOUTH AT BEDTIME AS NEEDED 60 tablet 3   No facility-administered medications prior to visit.     PAST MEDICAL HISTORY: Past Medical History:  Diagnosis Date   Diabetes mellitus 08/26/2010   Esophageal stricture 06/27/2001   s/p dilation   Eustachian tube disorder 10/26/2014   Dr. Ilda Foil   GERD (gastroesophageal reflux disease)    Gout    Hypertension    PSORIASIS 10/16/2006   Qualifier: Diagnosis of  By: Dawson Bills       PAST SURGICAL  HISTORY: Past Surgical History:  Procedure Laterality Date   Facial Trauma       FAMILY HISTORY: Family History  Problem Relation Age of Onset   Diabetes Mother    Diabetes Father    Heart attack Father        age 51, 2 stents   Lung cancer Other        GM   Melanoma Other        GF   Colon cancer Neg Hx    Prostate cancer Neg Hx      SOCIAL HISTORY: Social History   Socioeconomic History   Marital status: Married    Spouse name: Not on file   Number of  children: 1   Years of education: Not on file   Highest education level: Not on file  Occupational History   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  Tobacco Use   Smoking status: Never   Smokeless tobacco: Never  Vaping Use   Vaping Use: Never used  Substance and Sexual Activity   Alcohol use: Yes    Comment: 3 days a week   Drug use: No   Sexual activity: Not on file  Other Topics Concern   Not on file  Social History Narrative   Household- pt and wife   Right handed    Caffeine: 2 cups/day   Social Determinants of Health   Financial Resource Strain: Not on file  Food Insecurity: Not on file  Transportation Needs: Not on file  Physical Activity: Not on file  Stress: Not on file  Social Connections: Not on file  Intimate Partner Violence: Not on file     PHYSICAL EXAM  Vitals:   03/28/22 1338  BP: 129/71  Pulse: (!) 53  Weight: 242 lb 8 oz (110 kg)  Height: 6' 1"  (1.854 m)   Body mass index is 31.99 kg/m.  Generalized: Well developed, in no acute distress  Cardiology: normal rate and rhythm, no murmur auscultated  Respiratory: clear to auscultation bilaterally    Neurological examination  Mentation: Alert oriented to time, place, history taking. Follows all commands speech and language fluent Cranial nerve II-XII: Pupils were equal round reactive to light. Extraocular movements were full, visual field were full on confrontational test. Facial sensation and  strength were normal. Head turning and shoulder shrug  were normal and symmetric. Motor: The motor testing reveals 5 over 5 strength of all 4 extremities. Good symmetric motor tone is noted throughout.  Sensory: Sensory testing is intact to soft touch on all 4 extremities with exception of sensation to bilateral radial heads radiating along radius to mid forearm. No evidence of extinction is noted.  Gait and station: Gait is normal.    DIAGNOSTIC DATA (LABS, IMAGING, TESTING) - I reviewed patient records, labs, notes, testing and imaging myself where available.  Lab Results  Component Value Date   WBC 4.8 12/13/2021   HGB 12.9 (L) 12/13/2021   HCT 38.5 (L) 12/13/2021   MCV 91.6 12/13/2021   PLT 186.0 12/13/2021      Component Value Date/Time   NA 137 02/02/2022 1034   K 4.3 02/02/2022 1034   CL 101 02/02/2022 1034   CO2 26 02/02/2022 1034   GLUCOSE 98 02/02/2022 1034   BUN 17 02/02/2022 1034   CREATININE 0.94 02/02/2022 1034   CREATININE 0.88 03/06/2020 0722   CALCIUM 9.3 02/02/2022 1034   PROT 6.9 12/13/2021 1156   PROT 6.8 08/27/2020 0850   ALBUMIN 4.2 12/13/2021 1156   AST 36 12/13/2021 1156   ALT 42 12/13/2021 1156   ALKPHOS 58 12/13/2021 1156   BILITOT 0.5 12/13/2021 1156   GFRNONAA >89 10/27/2015 1600   GFRAA >89 10/27/2015 1600   Lab Results  Component Value Date   CHOL 141 06/08/2021   HDL 62.00 06/08/2021   LDLCALC 67 06/08/2021   LDLDIRECT 138.4 03/28/2011   TRIG 63.0 06/08/2021   CHOLHDL 2 06/08/2021   Lab Results  Component Value Date   HGBA1C 6.7 (H) 03/17/2022   Lab Results  Component Value Date   VITAMINB12 546 02/02/2022   Lab Results  Component Value Date   TSH 3.22 02/02/2022  No data to display               No data to display           ASSESSMENT AND PLAN  61 y.o. year old male  has a past medical history of Diabetes mellitus (08/26/2010), Esophageal stricture (06/27/2001), Eustachian tube disorder (10/26/2014),  GERD (gastroesophageal reflux disease), Gout, Hypertension, and PSORIASIS (10/16/2006). here with    Painful diabetic neuropathy ([place]) - Plan: Ambulatory referral to Pain Clinic  Numbness of upper extremity  DAXTON NYDAM is doing well, today. He does note slight worsening of burning/stinging of bilateral feet. He recently increased gabapentin to 883m TID per direction of PCP. He is tolerating well. We have discussed adding alpha lipoic acide OTC. I have also referred him to Dr RRanell Patrickwith [facility] for consideration of Qutenza treatments. Nerve stimulator may also be beneficial in the future. I will discuss new symptoms of writs numbness with Dr ARexene Alberts NCS in 2022 showed mild axonal sensory neuropathy of lower extremities. Healthy lifestyle habits encouraged. He will follow up with PCP as directed. He will return to see me in 6 months, sooner if needed. He verbalizes understanding and agreement with this plan.   Orders Placed This Encounter  Procedures   Ambulatory referral to Pain Clinic    Referral Priority:   Routine    Referral Type:   Consultation    Referral Reason:   Specialty Services Required    Requested Specialty:   Pain Medicine    Number of Visits Requested:   1     No orders of the defined types were placed in this encounter.    ADebbora Presto MSN, FNP-C 03/28/2022, 2:16 PM  [facility] [address] [place] [phone]

## 2022-03-28 ENCOUNTER — Encounter: Payer: Self-pay | Admitting: Family Medicine

## 2022-03-28 ENCOUNTER — Ambulatory Visit: Payer: BC Managed Care – PPO | Admitting: Family Medicine

## 2022-03-28 ENCOUNTER — Telehealth: Payer: Self-pay | Admitting: Family Medicine

## 2022-03-28 VITALS — BP 129/71 | HR 53 | Ht 73.0 in | Wt 242.5 lb

## 2022-03-28 DIAGNOSIS — R200 Anesthesia of skin: Secondary | ICD-10-CM

## 2022-03-28 DIAGNOSIS — E1140 Type 2 diabetes mellitus with diabetic neuropathy, unspecified: Secondary | ICD-10-CM | POA: Diagnosis not present

## 2022-03-28 NOTE — Telephone Encounter (Signed)
Referral for pain clinic sent to [facility]. Phone: [phone], Fax: [phone]

## 2022-03-31 ENCOUNTER — Encounter: Payer: Self-pay | Admitting: Family Medicine

## 2022-04-15 ENCOUNTER — Encounter: Payer: Self-pay | Admitting: Physical Medicine and Rehabilitation

## 2022-04-19 ENCOUNTER — Other Ambulatory Visit: Payer: Self-pay | Admitting: Internal Medicine

## 2022-05-02 ENCOUNTER — Encounter: Payer: Self-pay | Admitting: Physical Medicine and Rehabilitation

## 2022-05-02 ENCOUNTER — Encounter
Payer: BC Managed Care – PPO | Attending: Physical Medicine and Rehabilitation | Admitting: Physical Medicine and Rehabilitation

## 2022-05-02 VITALS — BP 124/74 | HR 53 | Ht 73.0 in | Wt 245.0 lb

## 2022-05-02 DIAGNOSIS — Z6832 Body mass index (BMI) 32.0-32.9, adult: Secondary | ICD-10-CM | POA: Insufficient documentation

## 2022-05-02 DIAGNOSIS — G6289 Other specified polyneuropathies: Secondary | ICD-10-CM | POA: Insufficient documentation

## 2022-05-02 DIAGNOSIS — E669 Obesity, unspecified: Secondary | ICD-10-CM | POA: Insufficient documentation

## 2022-05-02 MED ORDER — TOPIRAMATE 25 MG PO TABS: 25.0000 mg | ORAL_TABLET | Freq: Every day | ORAL | 3 refills | Status: DC

## 2022-05-02 NOTE — Progress Notes (Signed)
Subjective:    Patient ID: HUBER MATHERS, male    DOB: [birth year], 61 y.o.   MRN: [id number]  HPI Mr. 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 General activity 5 Relation with others 0 Enjoyment of life 0 What TIME of day is your pain at its worst? daytime Sleep (in general) Good  Pain is worse with: walking Pain improves with:  nothing Relief from Meds: 0  walk without assistance how many minutes can you walk? 30 ability to climb steps?  yes do you drive?  yes  employed # of hrs/week [employer] Do you have any goals in this area?  yes  No problems in this area  Any changes since last visit?  yes - Chest Xray [place]  Any changes since last visit?  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  Tobacco Use   Smoking status: Never   Smokeless tobacco: Never  Vaping Use   Vaping Use: Never used  Substance and Sexual Activity   Alcohol use: Yes    Comment: 3 days a week   Drug use: No   Sexual activity: Not on file  Other  Topics Concern   Not on file  Social History Narrative   Household- pt and wife   Right handed    Caffeine: 2 cups/day   Social Determinants of Health   Financial Resource Strain: Not on file  Food Insecurity: Not on file  Transportation Needs: Not on file  Physical Activity: Not on file  Stress: Not on file  Social Connections: Not on file   Past Surgical History:  Procedure Laterality Date   Facial Trauma     Past Medical History:  Diagnosis Date   Diabetes mellitus 08/26/2010   Esophageal stricture 06/27/2001   s/p dilation   Eustachian tube disorder 10/26/2014   Dr. Verne Spurr   GERD (gastroesophageal reflux disease)    Gout    Hypertension    PSORIASIS 10/16/2006   Qualifier: Diagnosis of  By: Shary Decamp     BP 124/74   Pulse (!) 53   Ht 6\' 1"  (1.854 m)   Wt 245 lb (111.1 kg)   SpO2 95%   BMI 32.32 kg/m   Opioid Risk Score:   Fall Risk Score:  `1  Depression screen [facility] 2/9     03/17/2022    8:22 AM 01/06/2022    9:42 AM 10/07/2021    9:03 AM 06/08/2021    9:14 AM 12/18/2020    9:29 AM 05/15/2020    9:24 AM 04/11/2019    8:15 AM  Depression screen PHQ 2/9  Decreased Interest 0 0 0 0 0 0 0  Down, Depressed, Hopeless 0 0 0 0 0 0 0  PHQ - 2 Score 0 0 0 0 0 0 0    Review of Systems  Musculoskeletal:        Pain in both feet  All other systems reviewed and are negative.      Objective:   Physical Exam  Gen: no distress, normal appearing, BMI 32.32, weight 245 lbs HEENT: oral mucosa pink and moist, NCAT Cardio: Reg rate Chest: normal effort, normal rate of breathing Abd: soft, non-distended Ext: no edema Psych: pleasant, normal affect Skin: intact Neuro: Alert and oriented x3      Assessment & Plan:   1) Peripheral neuropathy -Discussed Qutenza as an option for neuropathic pain control. Discussed that this is a capsaicin patch, stronger than capsaicin cream. Discussed that it is currently approved for diabetic peripheral neuropathy and  post-herpetic neuralgia, but that it has also shown benefit in treating other forms of neuropathy. Provided patient with link to site to learn more about the patch: [employer]fr. Discussed that the patch would be placed in office and benefits usually last 3 months. Discussed that unintended exposure to capsaicin can cause severe irritation of eyes, mucous membranes, respiratory tract, and skin, but that Qutenza is a local treatment and does not have the systemic side effects of other nerve medications. Discussed that there may be pain, itching, erythema, and decreased sensory function associated with the application of Qutenza. Side effects usually subside within 1 week. A cold pack of analgesic medications can help with these side effects. Blood pressure can also be increased due to pain associated with administration of the patch.   2) Obesity -BMI reviewed and is 32.32, weight is 245 lbs -.prescribed topamax

## 2022-05-02 NOTE — Patient Instructions (Addendum)
[facility]   Foods that help with pain:  1) Ginger (especially studied for arthritis)- reduce leukotriene production to decrease inflammation 2) Blueberries- high in phytonutrients that decrease inflammation 3) Salmon- marine omega-3s reduce joint swelling and pain 4) Pumpkin seeds- reduce inflammation 5) dark chocolate- reduces inflammation 6) turmeric- reduces inflammation 7) tart cherries - reduce pain and stiffness 8) extra virgin olive oil - its compound olecanthal helps to block prostaglandins  9) chili peppers- can be eaten or applied topically via capsaicin 10) mint- helpful for headache, muscle aches, joint pain, and itching 11) garlic- reduces inflammation  Link to further information on diet for chronic pain: [email or web address]

## 2022-05-31 ENCOUNTER — Other Ambulatory Visit: Payer: Self-pay | Admitting: Internal Medicine

## 2022-06-22 ENCOUNTER — Other Ambulatory Visit: Payer: Self-pay | Admitting: Internal Medicine

## 2022-06-24 ENCOUNTER — Ambulatory Visit: Payer: BC Managed Care – PPO | Admitting: Physical Medicine and Rehabilitation

## 2022-07-18 ENCOUNTER — Other Ambulatory Visit: Payer: Self-pay | Admitting: Internal Medicine

## 2022-07-21 ENCOUNTER — Ambulatory Visit (INDEPENDENT_AMBULATORY_CARE_PROVIDER_SITE_OTHER): Payer: BC Managed Care – PPO | Admitting: Internal Medicine

## 2022-07-21 ENCOUNTER — Encounter: Payer: Self-pay | Admitting: Internal Medicine

## 2022-07-21 VITALS — BP 132/68 | HR 70 | Temp 98.2°F | Resp 16 | Ht 73.0 in | Wt 247.1 lb

## 2022-07-21 DIAGNOSIS — E785 Hyperlipidemia, unspecified: Secondary | ICD-10-CM | POA: Diagnosis not present

## 2022-07-21 DIAGNOSIS — E1140 Type 2 diabetes mellitus with diabetic neuropathy, unspecified: Secondary | ICD-10-CM

## 2022-07-21 DIAGNOSIS — Z0000 Encounter for general adult medical examination without abnormal findings: Secondary | ICD-10-CM

## 2022-07-21 DIAGNOSIS — I10 Essential (primary) hypertension: Secondary | ICD-10-CM

## 2022-07-21 DIAGNOSIS — M109 Gout, unspecified: Secondary | ICD-10-CM

## 2022-07-21 LAB — MICROALBUMIN / CREATININE URINE RATIO
Creatinine,U: 69.3 mg/dL
Microalb Creat Ratio: 2.7 mg/g (ref 0.0–30.0)
Microalb, Ur: 1.9 mg/dL (ref 0.0–1.9)

## 2022-07-21 LAB — COMPREHENSIVE METABOLIC PANEL
ALT: 17 U/L (ref 0–53)
AST: 13 U/L (ref 0–37)
Albumin: 4.4 g/dL (ref 3.5–5.2)
Alkaline Phosphatase: 48 U/L (ref 39–117)
BUN: 21 mg/dL (ref 6–23)
CO2: 29 mEq/L (ref 19–32)
Calcium: 9.5 mg/dL (ref 8.4–10.5)
Chloride: 99 mEq/L (ref 96–112)
Creatinine, Ser: 0.92 mg/dL (ref 0.40–1.50)
GFR: 90.62 mL/min (ref >60.00)
Glucose, Bld: 135 mg/dL — ABNORMAL HIGH (ref 70–99)
Potassium: 4.6 mEq/L (ref 3.5–5.1)
Sodium: 138 mEq/L (ref 135–145)
Total Bilirubin: 0.3 mg/dL (ref 0.2–1.2)
Total Protein: 7 g/dL (ref 6.0–8.3)

## 2022-07-21 LAB — LIPID PANEL
Cholesterol: 209 mg/dL — ABNORMAL HIGH (ref 0–200)
HDL: 50.5 mg/dL (ref >39.00)
LDL Cholesterol: 126 mg/dL — ABNORMAL HIGH (ref 0–99)
NonHDL: 158.6
Total CHOL/HDL Ratio: 4
Triglycerides: 164 mg/dL — ABNORMAL HIGH (ref 0.0–149.0)
VLDL: 32.8 mg/dL (ref 0.0–40.0)

## 2022-07-21 LAB — HEMOGLOBIN A1C: Hgb A1c MFr Bld: 7 % — ABNORMAL HIGH (ref 4.6–6.5)

## 2022-07-21 LAB — URIC ACID: Uric Acid, Serum: 8 mg/dL — ABNORMAL HIGH (ref 4.0–7.8)

## 2022-07-21 LAB — PSA: PSA: 1.17 ng/mL (ref 0.10–4.00)

## 2022-07-21 NOTE — Progress Notes (Signed)
Subjective:    Patient ID: JOFFREY KERCE, male    DOB: 03-09-1961, 62 y.o.   MRN: [id number]  DOS:  07/21/2022 Type of visit - description: CPX  Here for CPX. Neuropathy continues to be an issue for him. GERD symptoms controlled No recent gout attacks.  Review of Systems  Other than above, a 14 point review of systems is negative     Past Medical History:  Diagnosis Date   Diabetes mellitus 08/26/2010   Esophageal stricture 06/27/2001   s/p dilation   Eustachian tube disorder 10/26/2014   Dr. 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  Tobacco Use   Smoking status: Never   Smokeless tobacco: Never  Vaping Use   Vaping Use: Never used  Substance and Sexual Activity   Alcohol use: Yes    Comment: 3 days a week   Drug use: No   Sexual activity: Not on file  Other Topics Concern   Not on file  Social History Narrative   Household- pt and wife   Right handed    Caffeine: 2 cups/day   Social Determinants of Health   Financial Resource Strain: Not on file  Food Insecurity: Not on file  Transportation Needs: Not on file  Physical Activity: Not on file  Stress: Not on file  Social Connections: Not on file  Intimate Partner Violence: Not on file    Current Outpatient Medications  Medication Instructions   allopurinol (ZYLOPRIM) 100 mg, Oral, Daily   amLODipine (NORVASC) 10 mg, Oral, Daily   aspirin 81 mg, Daily   atorvastatin (LIPITOR) 10 mg, Oral, Daily   Blood Glucose Monitoring Suppl  (FREESTYLE LITE) w/Device KIT 1 Device, Does not apply, As directed, Check blood sugars twice daily   calcipotriene (DOVONOX) 0.005 % cream Topical, 2 times daily   empagliflozin (JARDIANCE) 25 mg, Oral, Daily before breakfast   fluticasone (FLONASE) 50 MCG/ACT nasal spray 2 sprays, Each Nare, Daily   gabapentin (NEURONTIN) 800 mg, Oral, 3 times daily   glipiZIDE (GLUCOTROL XL) 5 MG 24 hr tablet TAKE 1 TABLET BY MOUTH DAILY WITH BREAKFAST.   glucose blood (FREESTYLE LITE) test strip Check blood sugars twice daily   hydrochlorothiazide (HYDRODIURIL) 25 mg, Oral, Daily   Lancets (FREESTYLE) lancets Check blood sugars twice daily   loratadine (CLARITIN) 10 MG tablet TAKE 1 TABLET BY MOUTH DAILY.   losartan (COZAAR) 100 mg, Oral, Daily   metFORMIN (GLUCOPHAGE) 850 MG tablet TAKE 2 TABLETS BY MOUTH WITH BREAKFAST AND 1 TABLET BY MOUTH WITH DINNER.   metoprolol tartrate (LOPRESSOR) 100 mg, Oral, 2 times daily   omeprazole (PRILOSEC) 20 mg, Oral, Daily,     ondansetron (ZOFRAN) 4 mg, Oral, Every 8 hours PRN   pioglitazone (ACTOS) 30 mg, Oral, Daily   Risankizumab-rzaa (SKYRIZI) 150 MG/ML SOSY Take as directed   sildenafil (REVATIO) 20 MG tablet TAKE 4 TO 5 TABLETS BY MOUTH AT  BEDTIME AS NEEDED       Objective:   Physical Exam BP 132/68   Pulse 70   Temp 98.2 F (36.8 C) (Oral)   Resp 16   Ht 6\' 1"  (1.854 m)   Wt 247 lb 2 oz (112.1 kg)   SpO2 97%   BMI 32.60 kg/m  General: Well developed, NAD, BMI noted Neck: No  thyromegaly  HEENT:  Normocephalic . Face symmetric, atraumatic Lungs:  CTA B Normal respiratory effort, no intercostal retractions, no accessory muscle use. Heart: RRR,  no murmur.  Abdomen:  Not distended, soft, non-tender. No rebound or rigidity.   Lower extremities: no pretibial edema bilaterally  Skin: Exposed areas without rash. Not pale. Not jaundice Neurologic:  alert & oriented X3.  Speech normal, gait appropriate for age and unassisted Strength symmetric  and appropriate for age.  Psych: Cognition and judgment appear intact.  Cooperative with normal attention span and concentration.  Behavior appropriate. No anxious or depressed appearing.     Assessment      Assessment DM  dx 2012 Mild neuropathy dx 10-2015, saw neuro, 08-2020, extensive blood work (-) NCS confirmed mild neuropathy, likely DM related. HTN GERD Esophageal stricture, dilatation 2003 Gout Psoriasis  Eustachian  tube disorder, saw ENT 2016 B12 deficiency DX 02-2018 + FH CAD + Lyme serology 01/2022 , d/w  ID, Rx doxycycline   PLAN:  Here for CPX DM: Ambulatory CBGs 135, 160.  Continue Jardiance, glipizide, metformin, pioglitazone, checking labs Neuropathy: Still a issue for the patient, sees neurology, saw the management, does not plan to go back.  Intolerant to Topamax.  Recommend to continue working with neurology. + Lyme serology: 01-2022, this was discussed with ID, he finished a round of doxycycline. HTN: Ambulatory BPs reportedly similar to what we got here today 132/68.  Continue amlodipine hctz, losartan, metoprolol.  Labs. Gout: No recent episodes, on allopurinol, check uric acid. Psoriasis: Reports excellent control with Skyrizi. B12 deficiency: Latest levels very good. RTC 4-5 months

## 2022-07-21 NOTE — Assessment & Plan Note (Signed)
-  Td 2015 -  PNM 2015; PNM 13: 2016 - covd vax : benefits of booster d/w pt   - s/p shingrix  -  had a flu shot -- CCS:  cscope 04-2013, next  10 years per report; pt got a letter from GI already --Prostate cancer screening: No symptoms, check PSA --Diet and exercise:  d/w pt   -Labs: CMP FLP A1c micro PSA uric acid -POA: See AVS

## 2022-07-21 NOTE — Patient Instructions (Addendum)
Check the  blood pressure regularly BP GOAL is between 110/65 and  135/85. If it is consistently higher or lower, let me know   Blood sugar goals - early in AM fasting  ( blood sugar goal 70-130) - 2 hours after a meal (blood sugar goal less than 180)     GO TO THE LAB : Get the blood work     Grace, [place] back for a checkup in 4 to 5 months    "[place] of attorney" ,  "Living will" (Advance care planning documents)  If you already have a living will or healthcare power of attorney, is recommended you bring the copy to be scanned in your chart.   The document will be available to all the doctors you see in the system.  Advance care planning is a process that supports adults in  understanding and sharing their preferences regarding future medical care.  The patient's preferences are recorded in documents called Advance Directives and the can be modified at any time while the patient is in full mental capacity.   If you don't have one, please consider create one.      More information at: [email or web address]

## 2022-07-21 NOTE — Assessment & Plan Note (Signed)
Here for CPX DM: Ambulatory CBGs 135, 160.  Continue Jardiance, glipizide, metformin, pioglitazone, checking labs Neuropathy: Still a issue for the patient, sees neurology, saw the management, does not plan to go back.  Intolerant to Topamax.  Recommend to continue working with neurology. + Lyme serology: 01-2022, this was discussed with ID, he finished a round of doxycycline. HTN: Ambulatory BPs reportedly similar to what we got here today 132/68.  Continue amlodipine hctz, losartan, metoprolol.  Labs. Gout: No recent episodes, on allopurinol, check uric acid. Psoriasis: Reports excellent control with Skyrizi. B12 deficiency: Latest levels very good. RTC 4-5 months

## 2022-07-22 ENCOUNTER — Other Ambulatory Visit: Payer: Self-pay | Admitting: Internal Medicine

## 2022-07-22 NOTE — Telephone Encounter (Signed)
No change, see lab results

## 2022-07-22 NOTE — Telephone Encounter (Signed)
A1c yesterday slightly higher. Okay to refill glipizide 5mg ?

## 2022-08-02 ENCOUNTER — Encounter: Payer: Self-pay | Admitting: Physical Medicine and Rehabilitation

## 2022-08-02 ENCOUNTER — Encounter: Payer: 59 | Admitting: Physical Medicine and Rehabilitation

## 2022-08-02 VITALS — BP 120/71 | HR 50 | Temp 98.1°F | Ht 73.0 in | Wt 248.0 lb

## 2022-08-02 DIAGNOSIS — G6289 Other specified polyneuropathies: Secondary | ICD-10-CM

## 2022-08-02 NOTE — Progress Notes (Deleted)
Subjective:    Patient ID: Barry Bush, male    DOB: 03/13/1961, 62 y.o.   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 General activity 5 Relation with others 0 Enjoyment of life 0 What TIME of day is your pain at its worst? daytime Sleep (in general) Good  Pain is worse with: walking Pain improves with:  nothing Relief from Meds: 0  walk without assistance how many minutes can you walk? 30 ability to climb steps?  yes do you drive?  yes  employed # of hrs/week [employer] Do you have any goals in this area?  yes  No problems in this area  Any changes since last visit?  yes - Chest Xray [place]  Any changes since last visit?  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  Tobacco Use   Smoking status: Never   Smokeless tobacco: Never  Vaping Use   Vaping Use: Never used  Substance and Sexual Activity   Alcohol use: Yes    Comment: 3 days a week   Drug use: No   Sexual activity: Not on file   Other Topics Concern   Not on file  Social History Narrative   Household- pt and wife   Right handed    Caffeine: 2 cups/day   Social Determinants of Health   Financial Resource Strain: Not on file  Food Insecurity: Not on file  Transportation Needs: Not on file  Physical Activity: Not on file  Stress: Not on file  Social Connections: Not on file   Past Surgical History:  Procedure Laterality Date   Facial Trauma     Past Medical History:  Diagnosis Date   Diabetes mellitus 08/26/2010   Esophageal stricture 06/27/2001   s/p dilation   Eustachian tube disorder 10/26/2014   Dr. Ilda Foil   GERD (gastroesophageal reflux disease)    Gout    Hypertension    PSORIASIS 10/16/2006   Qualifier: Diagnosis of  By: Dawson Bills     BP 120/71   Pulse (!) 50   Temp 98.1 F (36.7 C)   Ht 6\' 1"  (1.854 m)   Wt 248 lb (112.5 kg)   SpO2 95%   BMI 32.72 kg/m   Opioid Risk Score:  Fall Risk Score:  `1  Depression screen [facility] 2/9     08/02/2022    9:20 AM 07/21/2022    8:19 AM 05/02/2022   10:24 AM 03/17/2022    8:22 AM 01/06/2022    9:42 AM 10/07/2021    9:03 AM 06/08/2021    9:14 AM  Depression screen PHQ 2/9  Decreased Interest 0 0 0 0 0 0 0  Down, Depressed, Hopeless 0 0 0 0 0 0 0  PHQ - 2 Score 0 0 0 0 0 0 0  Altered sleeping   0      Tired, decreased energy   1      Change in appetite   0      Feeling bad or failure about yourself    0      Trouble concentrating   0      Moving slowly or fidgety/restless   0      Suicidal thoughts   0      PHQ-9 Score   1        Review of Systems  Musculoskeletal:        Pain in both feet  All other systems reviewed and are negative.      Objective:   Physical Exam  Gen: no distress, normal appearing, BMI 32.32, weight 245 lbs HEENT: oral mucosa pink and moist, NCAT Cardio: Reg rate Chest: normal effort, normal rate of breathing Abd: soft, non-distended Ext: no edema Psych: pleasant, normal affect Skin:  intact Neuro: Alert and oriented x3      Assessment & Plan:   1) Peripheral neuropathy -Discussed Qutenza as an option for neuropathic pain control. Discussed that this is a capsaicin patch, stronger than capsaicin cream. Discussed that it is currently approved for diabetic peripheral neuropathy and post-herpetic neuralgia, but that it has also shown benefit in treating other forms of neuropathy. Provided patient with link to site to learn more about the patch: [employer]fr. Discussed that the patch would be placed in office and benefits usually last 3 months. Discussed that unintended exposure to capsaicin can cause severe irritation of eyes, mucous membranes, respiratory tract, and skin, but that Qutenza is a local treatment and does not have the systemic side effects of other nerve medications. Discussed that there may be pain, itching, erythema, and decreased sensory function associated with the application of Qutenza. Side effects usually subside within 1 week. A cold pack of analgesic medications can help with these side effects. Blood pressure can also be increased due to pain associated with administration of the patch.   2) Obesity -BMI reviewed and is 32.32, weight is 245 lbs -.prescribed topamax

## 2022-08-02 NOTE — Progress Notes (Signed)
Patient left as Qutenza was not covered for him.

## 2022-08-02 NOTE — Progress Notes (Deleted)
Subjective:    Patient ID: Barry Bush, male    DOB: [birth year], 61 y.o.   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 General activity 5 Relation with others 10 Enjoyment of life 10 What TIME of day is your pain at its worst? evening Sleep (in general) Good  Pain is worse with: walking and standing Pain improves with:  nothing Relief from Meds: 0      Family History  Problem Relation Age of Onset   Diabetes Mother    Diabetes Father    Heart attack Father        age 43, 2 stents   Lung cancer Other        GM, smoker   Melanoma Other        GF   Colon cancer Neg Hx    Prostate cancer Neg Hx    Social History   Socioeconomic History   Marital status: Married    Spouse name: Not on file   Number of children: 1   Years of education: Not on file   Highest education level: Not on file  Occupational History   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  Tobacco Use   Smoking status: Never   Smokeless tobacco: Never  Vaping Use   Vaping Use: Never used  Substance and Sexual Activity   Alcohol use: Yes    Comment: 3 days a week   Drug use: No   Sexual activity: Not on file  Other Topics Concern   Not on file  Social History Narrative   Household- pt and wife   Right handed    Caffeine: 2 cups/day   Social Determinants of Health   Financial Resource Strain: Not on file  Food Insecurity: Not on file  Transportation Needs: Not on file  Physical Activity: Not on file   Stress: Not on file  Social Connections: Not on file   Past Surgical History:  Procedure Laterality Date   Facial Trauma     Past Medical History:  Diagnosis Date   Diabetes mellitus 08/26/2010   Esophageal stricture 06/27/2001   s/p dilation   Eustachian tube disorder 10/26/2014   Dr. Ilda Foil   GERD (gastroesophageal reflux disease)    Gout    Hypertension    PSORIASIS 10/16/2006   Qualifier: Diagnosis of  By: Dawson Bills     BP 120/71   Pulse (!) 50   Temp 98.1 F (36.7 C)   Ht 6\' 1"  (1.854 m)   Wt 248 lb (112.5 kg)   SpO2 95%   BMI 32.72 kg/m   Opioid Risk Score:   Fall Risk Score:  `1  Depression screen [facility] 2/9     08/02/2022    9:20 AM 07/21/2022    8:19 AM 05/02/2022   10:24 AM 03/17/2022    8:22 AM 01/06/2022    9:42 AM 10/07/2021    9:03 AM 06/08/2021    9:14 AM  Depression screen PHQ 2/9  Decreased Interest 0 0 0 0 0 0 0  Down, Depressed, Hopeless 0 0 0 0 0 0 0  PHQ - 2 Score 0 0 0 0 0 0 0  Altered sleeping   0      Tired, decreased energy   1      Change in appetite   0      Feeling bad or failure about yourself    0      Trouble concentrating   0      Moving slowly or fidgety/restless   0      Suicidal thoughts   0      PHQ-9 Score   1        Review of Systems  Musculoskeletal:        Pain in both feet  All other systems reviewed and are negative.      Objective:   Physical Exam  Gen: no distress, normal appearing, BMI 32.32, weight 245 lbs HEENT: oral mucosa pink and moist, NCAT Cardio: Reg rate Chest: normal effort, normal rate of breathing Abd: soft, non-distended Ext: no edema Psych: pleasant, normal affect Skin: intact Neuro: Alert and oriented x3      Assessment & Plan:   1) Peripheral neuropathy -Discussed Qutenza as an option for neuropathic pain control. Discussed that this is a capsaicin patch, stronger than capsaicin cream. Discussed that it is currently approved for diabetic peripheral neuropathy and post-herpetic  neuralgia, but that it has also shown benefit in treating other forms of neuropathy. Provided patient with link to site to learn more about the patch: [employer]fr. Discussed that the patch would be placed in office and benefits usually last 3 months. Discussed that unintended exposure to capsaicin can cause severe irritation of eyes, mucous membranes, respiratory tract, and skin, but that Qutenza is a local treatment and does not have the systemic side effects of other nerve medications. Discussed that there may be pain, itching, erythema, and decreased sensory function associated with the application of Qutenza. Side effects usually subside within 1 week. A cold pack of analgesic medications can help with these side effects. Blood pressure can also be increased due to pain associated with administration of the patch.   2) Obesity -BMI reviewed and is 32.32, weight is 245 lbs -.prescribed topamax

## 2022-08-23 ENCOUNTER — Other Ambulatory Visit: Payer: Self-pay | Admitting: Internal Medicine

## 2022-09-06 ENCOUNTER — Telehealth: Payer: Self-pay | Admitting: Internal Medicine

## 2022-09-06 MED ORDER — METFORMIN HCL 850 MG PO TABS: ORAL_TABLET | ORAL | 1 refills | Status: DC

## 2022-09-06 NOTE — Telephone Encounter (Signed)
Pharmacy updated and rx sent 

## 2022-09-06 NOTE — Telephone Encounter (Signed)
Patient would like to change his Pharmacy for all future refills to:  [facility] [address], [place], [place] [address] Phone: [phone]   He would also like to get a refill of his MEtformin to be sent to that pharmacy. Please advise.

## 2022-10-05 NOTE — Patient Instructions (Signed)
Below is our plan:  We will continue to monitor. Continue gabapentin per primary care. Let me know if symptoms worsen.   Please make sure you are staying well hydrated. I recommend 50-60 ounces daily. Well balanced diet and regular exercise encouraged. Consistent sleep schedule with 6-8 hours recommended.   Please continue follow up with care team as directed.   Follow up with me in 1 year   You may receive a survey regarding today's visit. I encourage you to leave honest feed back as I do use this information to improve patient care. Thank you for seeing me today!

## 2022-10-05 NOTE — Progress Notes (Unsigned)
No chief complaint on file.   HISTORY OF PRESENT ILLNESS:  10/05/22 ALL:  Keisean returns for follow up for neuropathy. He was last seen 03/2022 and referred for consideration for Qutenza. He continues gabapentin 800mg  TID per PCP. ALA? Qutenza?  03/28/2022 ALL: SALIH WILLIAMSON is a 62 y.o. male here today for follow up for diabetic neuropathy. He was last seen by Dr Frances Furbish and doing well on gabapentin 800mg  BID written by PCP. Since, he reports dose was increased to 800mg  TID. He is doing failry well. No significant worsening. He continues to have burning/stinging pain in both feet. He remains active. He is wearing diabetic socks and shoes. He alternates between 4 pairs of diabetic shoes. He does feel this helps. He does note,, over the past few months that he has decreased sensation to the radial area of wrists, bilaterally, that runs to mid forearm. No radiation to hand or fingers. No weakness noted. Last A1C was 6.7. He owns a [employer]. No obvious injuries.    HISTORY (copied from Dr Teofilo Pod previous note)  Mr. Turrell is a 62 year old right-handed gentleman with an underlying medical history of hypertension, reflux disease, gout, psoriasis, diabetes and obesity, who presents for follow-up consultation of his neuropathy.  The patient is unaccompanied today.  I first met him at the request of his primary care physician on 08/27/2020, at which time he reported a several year history of approximately 6 to 7 years of paresthesias affecting his feet.  He had had recent blood work through his primary care physician.  Examination showed decreased sensation to pinprick and vibration in the distal lower extremities bilaterally.  He was on gabapentin which had been started by his primary care physician and he was on 600 mg twice daily at the time.  A1c has been below 7, he had a positive ANA and was advised to talk to his primary care physician about further evaluation for this.  I suggested we  proceed with EMG and nerve conduction velocity testing as well as additional blood work through our office.  He was advised to continue with gabapentin.  He was advised to consider seeing dermatology for his psoriasis which had become worse.     Blood work from 08/27/2020 included multiple myeloma panel, vitamin B1, vitamin B6, RPR, ESR, heavy metal profile, vitamin D.  Labs were benign and he was notified.   He had and EMG/NCV test through our office on 09/09/20 and I reviewed the results:    Conclusion: This is an abnormal study.  There is electrodiagnostic evidence of mild length dependent axonal neuropathy, mainly involving sensory component.   We called him with his test results.    Today, 12/03/20: He reports feeling stable.  His primary care physician increased the gabapentin to 800 mg twice daily.  He reports doing well with that and it is helpful.  He has not had any major side effects from it.  Symptoms are about the same, he has not had any recent falls.  He did see dermatology in April and is waiting to start Stelara, previously was on [place] with good success but insurance did not cover it any longer.   REVIEW OF SYSTEMS: Out of a complete 14 system review of symptoms, the patient complains only of the following symptoms, numbness, burning, tingling of bilateral feet, numbness of wrists bilaterally and all other reviewed systems are negative.   ALLERGIES: No Known Allergies   HOME MEDICATIONS: Outpatient Medications Prior to Visit  Medication Sig Dispense Refill   pioglitazone (ACTOS) 30 MG tablet TAKE 1 TABLET (30 MG TOTAL) BY MOUTH DAILY. 90 tablet 1   allopurinol (ZYLOPRIM) 100 MG tablet TAKE 1 TABLET (100 MG TOTAL) BY MOUTH DAILY. 90 tablet 1   amLODipine (NORVASC) 10 MG tablet Take 1 tablet (10 mg total) by mouth daily. 30 tablet 6   aspirin 81 MG tablet Take 81 mg by mouth daily.     atorvastatin (LIPITOR) 10 MG tablet TAKE 1 TABLET (10 MG TOTAL) BY MOUTH DAILY. 90 tablet 1    Blood Glucose Monitoring Suppl (FREESTYLE LITE) w/Device KIT 1 Device by Does not apply route as directed. Check blood sugars twice daily 1 kit 0   calcipotriene (DOVONOX) 0.005 % cream Apply topically 2 (two) times daily.     empagliflozin (JARDIANCE) 25 MG TABS tablet Take 1 tablet (25 mg total) by mouth daily before breakfast. 90 tablet 1   fluticasone (FLONASE) 50 MCG/ACT nasal spray Place 2 sprays into both nostrils daily. 16 g 0   gabapentin (NEURONTIN) 800 MG tablet Take 1 tablet (800 mg total) by mouth 3 (three) times daily. 270 tablet 1   glipiZIDE (GLUCOTROL XL) 5 MG 24 hr tablet Take 1 tablet (5 mg total) by mouth daily with breakfast. 90 tablet 1   glucose blood (FREESTYLE LITE) test strip Check blood sugars twice daily 200 each 12   hydrochlorothiazide (HYDRODIURIL) 25 MG tablet TAKE 1 TABLET (25 MG TOTAL) BY MOUTH DAILY. 90 tablet 1   Lancets (FREESTYLE) lancets Check blood sugars twice daily 200 each 12   loratadine (CLARITIN) 10 MG tablet TAKE 1 TABLET BY MOUTH DAILY. 30 tablet 0   losartan (COZAAR) 100 MG tablet TAKE 1 TABLET (100 MG TOTAL) BY MOUTH DAILY. 90 tablet 1   metFORMIN (GLUCOPHAGE) 850 MG tablet Take 2 tablets (1,700 mg total) by mouth daily with breakfast AND 1 tablet (850 mg total) daily with supper. 270 tablet 1   metoprolol tartrate (LOPRESSOR) 100 MG tablet Take 1 tablet (100 mg total) by mouth 2 (two) times daily. 180 tablet 1   omeprazole (PRILOSEC) 20 MG capsule Take 20 mg by mouth daily.     ondansetron (ZOFRAN) 4 MG tablet Take 1 tablet (4 mg total) by mouth every 8 (eight) hours as needed for nausea or vomiting. 20 tablet 0   ranitidine (ZANTAC 150 MAXIMUM STRENGTH) 150 MG tablet Zantac 150     Risankizumab-rzaa (SKYRIZI) 150 MG/ML SOSY Take as directed     sildenafil (REVATIO) 20 MG tablet TAKE 4 TO 5 TABLETS BY MOUTH AT BEDTIME AS NEEDED 60 tablet 3   topiramate (TOPAMAX) 25 MG tablet Take 25 mg by mouth daily.     No facility-administered medications  prior to visit.     PAST MEDICAL HISTORY: Past Medical History:  Diagnosis Date   Diabetes mellitus 08/26/2010   Esophageal stricture 06/27/2001   s/p dilation   Eustachian tube disorder 10/26/2014   Dr. Verne Spurr   GERD (gastroesophageal reflux disease)    Gout    Hypertension    PSORIASIS 10/16/2006   Qualifier: Diagnosis of  By: Shary Decamp       PAST SURGICAL HISTORY: Past Surgical History:  Procedure Laterality Date   Facial Trauma       FAMILY HISTORY: Family History  Problem Relation Age of Onset   Diabetes Mother    Diabetes Father    Heart attack Father        age 96, 2 stents  Lung cancer Other        GM, smoker   Melanoma Other        GF   Colon cancer Neg Hx    Prostate cancer Neg Hx      SOCIAL HISTORY: Social History   Socioeconomic History   Marital status: Married    Spouse name: Not on file   Number of children: 1   Years of education: Not on file   Highest education level: Not on file  Occupational History   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  Tobacco Use   Smoking status: Never   Smokeless tobacco: Never  Vaping Use   Vaping Use: Never used  Substance and Sexual Activity   Alcohol use: Yes    Comment: 3 days a week   Drug use: No   Sexual activity: Not on file  Other Topics Concern   Not on file  Social History Narrative   Household- pt and wife   Right handed    Caffeine: 2 cups/day   Social Determinants of Health   Financial Resource Strain: Not on file  Food Insecurity: Not on file  Transportation Needs: Not on file  Physical Activity: Not on file  Stress: Not on file  Social Connections: Not on file  Intimate Partner Violence: Not on file     PHYSICAL EXAM  There were no vitals filed for this visit.  There is no height or weight on file to calculate BMI.  Generalized: Well developed, in no acute distress  Cardiology: normal rate and rhythm, no murmur auscultated   Respiratory: clear to auscultation bilaterally    Neurological examination  Mentation: Alert oriented to time, place, history taking. Follows all commands speech and language fluent Cranial nerve II-XII: Pupils were equal round reactive to light. Extraocular movements were full, visual field were full on confrontational test. Facial sensation and strength were normal. Head turning and shoulder shrug  were normal and symmetric. Motor: The motor testing reveals 5 over 5 strength of all 4 extremities. Good symmetric motor tone is noted throughout.  Sensory: Sensory testing is intact to soft touch on all 4 extremities with exception of sensation to bilateral radial heads radiating along radius to mid forearm. No evidence of extinction is noted.  Gait and station: Gait is normal.    DIAGNOSTIC DATA (LABS, IMAGING, TESTING) - I reviewed patient records, labs, notes, testing and imaging myself where available.  Lab Results  Component Value Date   WBC 4.8 12/13/2021   HGB 12.9 (L) 12/13/2021   HCT 38.5 (L) 12/13/2021   MCV 91.6 12/13/2021   PLT 186.0 12/13/2021      Component Value Date/Time   NA 138 07/21/2022 0906   K 4.6 07/21/2022 0906   CL 99 07/21/2022 0906   CO2 29 07/21/2022 0906   GLUCOSE 135 (H) 07/21/2022 0906   BUN 21 07/21/2022 0906   CREATININE 0.92 07/21/2022 0906   CREATININE 0.88 03/06/2020 0722   CALCIUM 9.5 07/21/2022 0906   PROT 7.0 07/21/2022 0906   PROT 6.8 08/27/2020 0850   ALBUMIN 4.4 07/21/2022 0906   AST 13 07/21/2022 0906   ALT 17 07/21/2022 0906   ALKPHOS 48 07/21/2022 0906   BILITOT 0.3 07/21/2022 0906   GFRNONAA >89 10/27/2015 1600   GFRAA >89 10/27/2015 1600   Lab Results  Component Value Date   CHOL 209 (H) 07/21/2022   HDL 50.50 07/21/2022   LDLCALC 126 (H) 07/21/2022  LDLDIRECT 138.4 03/28/2011   TRIG 164.0 (H) 07/21/2022   CHOLHDL 4 07/21/2022   Lab Results  Component Value Date   HGBA1C 7.0 (H) 07/21/2022   Lab Results  Component  Value Date   VITAMINB12 546 02/02/2022   Lab Results  Component Value Date   TSH 3.22 02/02/2022        No data to display               No data to display           ASSESSMENT AND PLAN  62 y.o. year old male  has a past medical history of Diabetes mellitus (08/26/2010), Esophageal stricture (06/27/2001), Eustachian tube disorder (10/26/2014), GERD (gastroesophageal reflux disease), Gout, Hypertension, and PSORIASIS (10/16/2006). here with    No diagnosis found.  Barry Bush is doing well, today. He does note slight worsening of burning/stinging of bilateral feet. He recently increased gabapentin to 800mg  TID per direction of PCP. He is tolerating well. We have discussed adding alpha lipoic acide OTC. I have also referred him to Dr Carlis Abbott with [facility] for consideration of Qutenza treatments. Nerve stimulator may also be beneficial in the future. I will discuss new symptoms of writs numbness with Dr Frances Furbish. NCS in 2022 showed mild axonal sensory neuropathy of lower extremities.Healthy lifestyle habits encouraged. He will follow up with PCP as directed. He will return to see me in 6 months, sooner if needed. He verbalizes understanding and agreement with this plan.   No orders of the defined types were placed in this encounter.    No orders of the defined types were placed in this encounter.    Shawnie Dapper, MSN, FNP-C 10/05/2022, 5:18 PM  [facility] [address] [place], [place] [address] [phone]

## 2022-10-06 ENCOUNTER — Ambulatory Visit: Payer: 59 | Admitting: Family Medicine

## 2022-10-06 ENCOUNTER — Encounter: Payer: Self-pay | Admitting: Family Medicine

## 2022-10-06 VITALS — BP 119/66 | HR 54 | Ht 73.0 in | Wt 246.2 lb

## 2022-10-06 DIAGNOSIS — E1140 Type 2 diabetes mellitus with diabetic neuropathy, unspecified: Secondary | ICD-10-CM

## 2022-12-20 ENCOUNTER — Encounter: Payer: Self-pay | Admitting: Internal Medicine

## 2022-12-20 ENCOUNTER — Ambulatory Visit: Payer: 59 | Admitting: Internal Medicine

## 2022-12-20 VITALS — BP 122/66 | HR 56 | Temp 97.4°F | Resp 16 | Ht 73.0 in | Wt 239.0 lb

## 2022-12-20 DIAGNOSIS — E1140 Type 2 diabetes mellitus with diabetic neuropathy, unspecified: Secondary | ICD-10-CM

## 2022-12-20 DIAGNOSIS — I10 Essential (primary) hypertension: Secondary | ICD-10-CM | POA: Diagnosis not present

## 2022-12-20 DIAGNOSIS — M109 Gout, unspecified: Secondary | ICD-10-CM

## 2022-12-20 DIAGNOSIS — Z7984 Long term (current) use of oral hypoglycemic drugs: Secondary | ICD-10-CM

## 2022-12-20 DIAGNOSIS — M545 Low back pain, unspecified: Secondary | ICD-10-CM | POA: Diagnosis not present

## 2022-12-20 DIAGNOSIS — E785 Hyperlipidemia, unspecified: Secondary | ICD-10-CM

## 2022-12-20 LAB — ALT: ALT: 11 U/L (ref 0–53)

## 2022-12-20 LAB — BASIC METABOLIC PANEL
BUN: 21 mg/dL (ref 6–23)
CO2: 28 mEq/L (ref 19–32)
Calcium: 9.5 mg/dL (ref 8.4–10.5)
Chloride: 99 mEq/L (ref 96–112)
Creatinine, Ser: 0.96 mg/dL (ref 0.40–1.50)
GFR: 85.86 mL/min (ref >60.00)
Glucose, Bld: 140 mg/dL — ABNORMAL HIGH (ref 70–99)
Potassium: 4.3 mEq/L (ref 3.5–5.1)
Sodium: 137 mEq/L (ref 135–145)

## 2022-12-20 LAB — URIC ACID: Uric Acid, Serum: 7.7 mg/dL (ref 4.0–7.8)

## 2022-12-20 LAB — LIPID PANEL
Cholesterol: 192 mg/dL (ref 0–200)
HDL: 47 mg/dL (ref >39.00)
LDL Cholesterol: 119 mg/dL — ABNORMAL HIGH (ref 0–99)
NonHDL: 145.22
Total CHOL/HDL Ratio: 4
Triglycerides: 132 mg/dL (ref 0.0–149.0)
VLDL: 26.4 mg/dL (ref 0.0–40.0)

## 2022-12-20 LAB — AST: AST: 15 U/L (ref 0–37)

## 2022-12-20 LAB — HEMOGLOBIN A1C: Hgb A1c MFr Bld: 7.2 % — ABNORMAL HIGH (ref 4.6–6.5)

## 2022-12-20 NOTE — Progress Notes (Signed)
Subjective:    Patient ID: Barry Bush, male    DOB: 28-Sep-1960, 62 y.o.   MRN: [id number]  DOS:  12/20/2022 Type of visit - description: f/u  Chronic medical problems addressed. Needs a new dermatologist. Neuropathy symptoms: At baseline. 2 months ago developed mild, right-sided back pain, no injury, no radiation, no new paresthesias.  Pain is worse when he bends over.  Review of Systems See above   Past Medical History:  Diagnosis Date   Diabetes mellitus 08/26/2010   Esophageal stricture 06/27/2001   s/p dilation   Eustachian tube disorder 10/26/2014   Dr. Verne Spurr   GERD (gastroesophageal reflux disease)    Gout    Hypertension    PSORIASIS 10/16/2006   Qualifier: Diagnosis of  By: Shary Decamp      Past Surgical History:  Procedure Laterality Date   Facial Trauma      Current Outpatient Medications  Medication Instructions   allopurinol (ZYLOPRIM) 100 mg, Oral, Daily   ALPHA LIPOIC ACID PO 600 mg, Oral   amLODipine (NORVASC) 10 mg, Oral, Daily   aspirin 81 mg, Daily   atorvastatin (LIPITOR) 10 mg, Oral, Daily   Blood Glucose Monitoring Suppl (FREESTYLE LITE) w/Device KIT 1 Device, Does not apply, As directed, Check blood sugars twice daily   calcipotriene (DOVONOX) 0.005 % cream Topical, 2 times daily   empagliflozin (JARDIANCE) 25 mg, Oral, Daily before breakfast   fluticasone (FLONASE) 50 MCG/ACT nasal spray 2 sprays, Each Nare, Daily   gabapentin (NEURONTIN) 800 mg, Oral, 3 times daily   glipiZIDE (GLUCOTROL XL) 5 mg, Oral, Daily with breakfast   glucose blood (FREESTYLE LITE) test strip Check blood sugars twice daily   hydrochlorothiazide (HYDRODIURIL) 25 mg, Oral, Daily   Lancets (FREESTYLE) lancets Check blood sugars twice daily   loratadine (CLARITIN) 10 MG tablet TAKE 1 TABLET BY MOUTH DAILY.   losartan (COZAAR) 100 mg, Oral, Daily   metFORMIN (GLUCOPHAGE) 850 MG tablet Take 2 tablets (1,700 mg total) by mouth daily with breakfast AND 1  tablet (850 mg total) daily with supper.   metoprolol tartrate (LOPRESSOR) 100 mg, Oral, 2 times daily   omeprazole (PRILOSEC) 20 mg, Oral, Daily,     ondansetron (ZOFRAN) 4 mg, Oral, Every 8 hours PRN   pioglitazone (ACTOS) 30 mg, Oral, Daily   ranitidine (ZANTAC 150 MAXIMUM STRENGTH) 150 MG tablet Zantac 150   Risankizumab-rzaa (SKYRIZI) 150 MG/ML SOSY Take as directed   sildenafil (REVATIO) 20 MG tablet TAKE 4 TO 5 TABLETS BY MOUTH AT BEDTIME AS NEEDED       Objective:   Physical Exam BP 122/66   Pulse (!) 56   Temp (!) 97.4 F (36.3 C) (Oral)   Resp 16   Ht 6\' 1"  (1.854 m)   Wt 239 lb (108.4 kg)   SpO2 97%   BMI 31.53 kg/m  General:   Well developed, NAD, BMI noted. HEENT:  Normocephalic . Face symmetric, atraumatic Lungs:  CTA B Normal respiratory effort, no intercostal retractions, no accessory muscle use. Heart: RRR,  no murmur.  DM foot exam: No edema, good pulses, pinprick examination decreased distally. MSK: No TTP at the low back. Skin: Not pale. Not jaundice Neurologic:  alert & oriented X3.  Speech normal, gait appropriate for age and unassisted Psych--  Cognition and judgment appear intact.  Cooperative with normal attention span and concentration.  Behavior appropriate. No anxious or depressed appearing.      Assessment    Assessment DM  dx 2012 Mild neuropathy dx 10-2015, saw neuro, 08-2020, extensive blood work (-) NCS confirmed mild neuropathy, likely DM related. HTN GERD Esophageal stricture, dilatation 2003 Gout (had to start HCTZ around 2021 due to uncontrolled BP) Psoriasis  Eustachian  tube disorder, saw ENT 2016 B12 deficiency DX 02-2018 + FH CAD + Lyme serology 01/2022 , d/w  ID, Rx doxycycline   PLAN:  DM: Currently on Jardiance, glipizide, metformin, pioglitazone.  Last A1c 7.0, patient was advised to improve diet before  changing  meds; diet is slightly better, check labs. HTN: BP today is very good, continue amlodipine, HCTZ,  losartan, metoprolol.  Check a BMP. Gout: No recent events, on allopurinol 100 mg qd. On chart review, I had to introduce HCTZ around 2021 because BP was not well-controlled with 3 medications. Since then uric acid has increased from 5.8 to ~ 8.0.  Fortunately he is not having any gout attacks.  Plan: Check uric acid, consider increase allopurinol, continue HCTZ for now. High cholesterol: Last LDL 126, previous LDL well-controlled on the same dose of atorvastatin 10 mg.  Recheck FLP consider adjust medications. Psoriasis: Off Skyrizi, needs a new dermatologist, he will call and let me know if he needs a referral. Neuropathy: Saw neurology recently, no changes made. Lumbalgia: Mild, see HPI, recommend self  PT see AVS, if not better call for a referral.   RTC 4 months

## 2022-12-20 NOTE — Assessment & Plan Note (Signed)
DM: Currently on Jardiance, glipizide, metformin, pioglitazone.  Last A1c 7.0, patient was advised to improve diet before  changing  meds; diet is slightly better, check labs. HTN: BP today is very good, continue amlodipine, HCTZ, losartan, metoprolol.  Check a BMP. Gout: No recent events, on allopurinol 100 mg qd. On chart review, I had to introduce HCTZ around 2021 because BP was not well-controlled with 3 medications. Since then uric acid has increased from 5.8 to ~ 8.0.  Fortunately he is not having any gout attacks.  Plan: Check uric acid, consider increase allopurinol, continue HCTZ for now. High cholesterol: Last LDL 126, previous LDL well-controlled on the same dose of atorvastatin 10 mg.  Recheck FLP consider adjust medications. Psoriasis: Off Skyrizi, needs a new dermatologist, he will call and let me know if he needs a referral. Neuropathy: Saw neurology recently, no changes made. Lumbalgia: Mild, see HPI, recommend self  PT see AVS, if not better call for a referral.   RTC 4 months

## 2022-12-20 NOTE — Patient Instructions (Addendum)
For back pain: Start exercises, see information below.  Call if not better [email or web address]   Check the  blood pressure regularly BP GOAL is between 110/65 and  135/85. If it is consistently higher or lower, let me know    GO TO THE LAB : Get the blood work     GO TO THE FRONT DESK, PLEASE SCHEDULE YOUR APPOINTMENTS Come back for   a checkup in 4 months   Vaccines I recommend: Covid booster RSV vaccine Flu shot this fall  Per our records you are due for your diabetic eye exam. Please contact your eye doctor to schedule an appointment. Please have them send copies of your office visit notes to [place]. Our fax number is [phone]. If you need a referral to an eye doctor please let us know.

## 2022-12-22 MED ORDER — PIOGLITAZONE HCL 45 MG PO TABS: 45.0000 mg | ORAL_TABLET | Freq: Every day | ORAL | 1 refills | Status: DC

## 2022-12-22 MED ORDER — ATORVASTATIN CALCIUM 20 MG PO TABS: 20.0000 mg | ORAL_TABLET | Freq: Every day | ORAL | 1 refills | Status: DC

## 2022-12-22 NOTE — Addendum Note (Signed)
Addended byConrad Turin D on: 12/22/2022 11:43 AM   Modules accepted: Orders

## 2023-01-13 ENCOUNTER — Other Ambulatory Visit: Payer: Self-pay | Admitting: Internal Medicine

## 2023-02-16 ENCOUNTER — Encounter: Payer: Self-pay | Admitting: Internal Medicine

## 2023-02-21 ENCOUNTER — Encounter: Payer: Self-pay | Admitting: Family Medicine

## 2023-02-21 ENCOUNTER — Telehealth: Payer: Self-pay | Admitting: Family Medicine

## 2023-02-21 NOTE — Telephone Encounter (Signed)
Sent mychart msg and letter in mail informing pt of appt change due to NP being out

## 2023-03-01 DIAGNOSIS — E119 Type 2 diabetes mellitus without complications: Secondary | ICD-10-CM | POA: Diagnosis not present

## 2023-03-01 LAB — HM DIABETES EYE EXAM

## 2023-03-02 ENCOUNTER — Other Ambulatory Visit: Payer: Self-pay | Admitting: Internal Medicine

## 2023-03-03 ENCOUNTER — Encounter: Payer: Self-pay | Admitting: Internal Medicine

## 2023-03-20 DIAGNOSIS — L400 Psoriasis vulgaris: Secondary | ICD-10-CM | POA: Diagnosis not present

## 2023-03-20 DIAGNOSIS — D225 Melanocytic nevi of trunk: Secondary | ICD-10-CM | POA: Diagnosis not present

## 2023-03-20 DIAGNOSIS — L570 Actinic keratosis: Secondary | ICD-10-CM | POA: Diagnosis not present

## 2023-03-31 ENCOUNTER — Other Ambulatory Visit: Payer: Self-pay | Admitting: Internal Medicine

## 2023-04-20 ENCOUNTER — Other Ambulatory Visit: Payer: Self-pay | Admitting: Internal Medicine

## 2023-04-21 ENCOUNTER — Ambulatory Visit: Payer: 59 | Admitting: Internal Medicine

## 2023-04-24 ENCOUNTER — Ambulatory Visit (INDEPENDENT_AMBULATORY_CARE_PROVIDER_SITE_OTHER): Payer: 59 | Admitting: Internal Medicine

## 2023-04-24 VITALS — BP 126/62 | HR 50 | Temp 98.1°F | Resp 18 | Ht 73.0 in | Wt 238.0 lb

## 2023-04-24 DIAGNOSIS — Z23 Encounter for immunization: Secondary | ICD-10-CM

## 2023-04-24 DIAGNOSIS — I10 Essential (primary) hypertension: Secondary | ICD-10-CM

## 2023-04-24 DIAGNOSIS — E785 Hyperlipidemia, unspecified: Secondary | ICD-10-CM

## 2023-04-24 DIAGNOSIS — Z7984 Long term (current) use of oral hypoglycemic drugs: Secondary | ICD-10-CM

## 2023-04-24 DIAGNOSIS — N529 Male erectile dysfunction, unspecified: Secondary | ICD-10-CM

## 2023-04-24 DIAGNOSIS — E1140 Type 2 diabetes mellitus with diabetic neuropathy, unspecified: Secondary | ICD-10-CM | POA: Diagnosis not present

## 2023-04-24 LAB — LIPID PANEL
Cholesterol: 134 mg/dL (ref 0–200)
HDL: 57.6 mg/dL (ref >39.00)
LDL Cholesterol: 54 mg/dL (ref 0–99)
NonHDL: 76.01
Total CHOL/HDL Ratio: 2
Triglycerides: 110 mg/dL (ref 0.0–149.0)
VLDL: 22 mg/dL (ref 0.0–40.0)

## 2023-04-24 LAB — AST: AST: 13 U/L (ref 0–37)

## 2023-04-24 LAB — HEMOGLOBIN A1C: Hgb A1c MFr Bld: 7.2 % — ABNORMAL HIGH (ref 4.6–6.5)

## 2023-04-24 LAB — MICROALBUMIN / CREATININE URINE RATIO
Creatinine,U: 100.6 mg/dL
Microalb Creat Ratio: 3.2 mg/g (ref 0.0–30.0)
Microalb, Ur: 3.3 mg/dL — ABNORMAL HIGH (ref 0.0–1.9)

## 2023-04-24 LAB — ALT: ALT: 14 U/L (ref 0–53)

## 2023-04-24 MED ORDER — SILDENAFIL CITRATE 20 MG PO TABS: ORAL_TABLET | ORAL | 3 refills | Status: AC

## 2023-04-24 NOTE — Assessment & Plan Note (Signed)
DM:  Last A1c 7.2, pioglitazone increased to 45 mg daily.  Also on Jardiance, glipizide, metformin. Amb CBGs fasting 80-120, postprandial 150s. Diet: Room for improvement.  Education about a healthy diet provided, will refer him to a nutritionist.  Check A1c, micro.  Further advised for results. HTN: BP is very good, amb BPs typically WNL.  No change. High cholesterol: Based on last FLP, atorvastatin increased to 20 mg.  Checking labs. ED: RF sildenafil Gout: See previous entry, last uric acid 7.7 Preventive care: Flu shot today, declined the COVID-vaccine, benefits discussed RTC 06-2023 CPX

## 2023-04-24 NOTE — Progress Notes (Signed)
Subjective:    Patient ID: Barry Bush, male    DOB: 1960-07-01, 62 y.o.   MRN: [id number]  DOS:  04/24/2023 Type of visit - description: f/u  Chronic medical problems addressed. In general feeling well.   Review of Systems See above   Past Medical History:  Diagnosis Date   Diabetes mellitus 08/26/2010   Esophageal stricture 06/27/2001   s/p dilation   Eustachian tube disorder 10/26/2014   Dr. Verne Spurr   GERD (gastroesophageal reflux disease)    Gout    Hypertension    PSORIASIS 10/16/2006   Qualifier: Diagnosis of  By: Shary Decamp      Past Surgical History:  Procedure Laterality Date   Facial Trauma      Current Outpatient Medications  Medication Instructions   allopurinol (ZYLOPRIM) 100 mg, Oral, Daily   ALPHA LIPOIC ACID PO 600 mg, Oral   amLODipine (NORVASC) 10 mg, Oral, Daily   aspirin 81 mg, Daily   atorvastatin (LIPITOR) 20 mg, Oral, Daily at bedtime   Blood Glucose Monitoring Suppl (FREESTYLE LITE) w/Device KIT 1 Device, Does not apply, As directed, Check blood sugars twice daily   calcipotriene (DOVONOX) 0.005 % cream Topical, 2 times daily   empagliflozin (JARDIANCE) 25 mg, Oral, Daily before breakfast   fluticasone (FLONASE) 50 MCG/ACT nasal spray 2 sprays, Each Nare, Daily   gabapentin (NEURONTIN) 800 mg, Oral, 3 times daily   glipiZIDE (GLUCOTROL XL) 5 mg, Oral, Daily with breakfast   glucose blood (FREESTYLE LITE) test strip Check blood sugars twice daily   hydrochlorothiazide (HYDRODIURIL) 25 mg, Oral, Daily   Lancets (FREESTYLE) lancets Check blood sugars twice daily   loratadine (CLARITIN) 10 MG tablet TAKE 1 TABLET BY MOUTH DAILY.   losartan (COZAAR) 100 mg, Oral, Daily   metFORMIN (GLUCOPHAGE) 850 MG tablet Take 2 tablets (1,700 mg total) by mouth daily with breakfast AND 1 tablet (850 mg total) daily with supper.   metoprolol tartrate (LOPRESSOR) 100 mg, Oral, 2 times daily   omeprazole (PRILOSEC) 20 mg, Oral, Daily,      ondansetron (ZOFRAN) 4 mg, Oral, Every 8 hours PRN   pioglitazone (ACTOS) 45 mg, Oral, Daily   sildenafil (REVATIO) 20 MG tablet TAKE 4 TO 5 TABLETS BY MOUTH AT BEDTIME AS NEEDED       Objective:   Physical Exam BP 126/62   Pulse (!) 50   Temp 98.1 F (36.7 C) (Oral)   Resp 18   Ht 6\' 1"  (1.854 m)   Wt 238 lb (108 kg)   SpO2 97%   BMI 31.40 kg/m  General:   Well developed, NAD, BMI noted. HEENT:  Normocephalic . Face symmetric, atraumatic Lungs:  CTA B Normal respiratory effort, no intercostal retractions, no accessory muscle use. Heart: RRR,  no murmur.  Lower extremities: no pretibial edema bilaterally  Skin: Not pale. Not jaundice Neurologic:  alert & oriented X3.  Speech normal, gait appropriate for age and unassisted Psych--  Cognition and judgment appear intact.  Cooperative with normal attention span and concentration.  Behavior appropriate. No anxious or depressed appearing.      Assessment    Assessment DM  dx 2012 Mild neuropathy dx 10-2015, saw neuro, 08-2020, extensive blood work (-) NCS confirmed mild neuropathy, likely DM related. HTN GERD Esophageal stricture, dilatation 2003 Gout (had to start HCTZ around 2021 due to uncontrolled BP) Psoriasis  Eustachian  tube disorder, saw ENT 2016 B12 deficiency DX 02-2018 + FH CAD + Lyme serology 01/2022 ,  d/w  ID, Rx doxycycline   PLAN:  DM:  Last A1c 7.2, pioglitazone increased to 45 mg daily.  Also on Jardiance, glipizide, metformin. Amb CBGs fasting 80-120, postprandial 150s. Diet: Room for improvement.  Education about a healthy diet provided, will refer him to a nutritionist.  Check A1c, micro.  Further advised for results. HTN: BP is very good, amb BPs typically WNL.  No change. High cholesterol: Based on last FLP, atorvastatin increased to 20 mg.  Checking labs. ED: RF sildenafil Gout: See previous entry, last uric acid 7.7 Preventive care: Flu shot today, declined the COVID-vaccine, benefits  discussed RTC 06-2023 CPX

## 2023-04-24 NOTE — Patient Instructions (Addendum)
We are referring you to a nutritionist to help you manage your diet better.  If you do not hear from them in the next few days please call  them at [phone]  Vaccines I recommend: Covid booster RSV vaccine    GO TO THE LAB : Get the blood work     Next visit with me by 06-2023, physical exam     Please schedule it at the front desk

## 2023-04-25 MED ORDER — GLIPIZIDE ER 10 MG PO TB24: 10.0000 mg | ORAL_TABLET | Freq: Every day | ORAL | 1 refills | Status: DC

## 2023-04-25 NOTE — Addendum Note (Signed)
Addended by: Conrad Madrid D on: 04/25/2023 10:59 AM   Modules accepted: Orders

## 2023-05-02 LAB — HM COLONOSCOPY

## 2023-05-17 ENCOUNTER — Encounter: Payer: Self-pay | Admitting: Internal Medicine

## 2023-05-22 ENCOUNTER — Encounter: Payer: Self-pay | Admitting: Internal Medicine

## 2023-05-24 ENCOUNTER — Other Ambulatory Visit: Payer: Self-pay | Admitting: Internal Medicine

## 2023-06-02 ENCOUNTER — Other Ambulatory Visit: Payer: Self-pay | Admitting: Internal Medicine

## 2023-06-16 DIAGNOSIS — L400 Psoriasis vulgaris: Secondary | ICD-10-CM | POA: Diagnosis not present

## 2023-06-16 DIAGNOSIS — L578 Other skin changes due to chronic exposure to nonionizing radiation: Secondary | ICD-10-CM | POA: Diagnosis not present

## 2023-06-29 ENCOUNTER — Other Ambulatory Visit: Payer: Self-pay | Admitting: Internal Medicine

## 2023-07-03 ENCOUNTER — Other Ambulatory Visit: Payer: Self-pay | Admitting: Internal Medicine

## 2023-07-03 ENCOUNTER — Other Ambulatory Visit: Payer: Self-pay

## 2023-07-03 ENCOUNTER — Encounter: Payer: 59 | Attending: Internal Medicine | Admitting: Dietician

## 2023-07-03 DIAGNOSIS — E1140 Type 2 diabetes mellitus with diabetic neuropathy, unspecified: Secondary | ICD-10-CM | POA: Diagnosis not present

## 2023-07-03 MED ORDER — ALLOPURINOL 100 MG PO TABS: 100.0000 mg | ORAL_TABLET | Freq: Every day | ORAL | 0 refills | Status: DC

## 2023-07-03 NOTE — Progress Notes (Signed)
 Diabetes Self-Management Education  Visit Type: First/Initial  Appt. Start Time: 1030 Appt. End Time: 1105  07/03/2023  [name], identified by name and date of birth, is a 63 y.o. male with a diagnosis of Diabetes: Type 2.   ASSESSMENT   Patient is here today alone. Patient would like to learn about about eating healthy for diabetes. Patient lives with his wife. Pt reports he does the shopping and wife does the cooking. Pt states he works full time and is own his feet most day. Pt reports self monitoring blood sugar once to twice weekly stating recently values obtained range 120-140 mg/dL before breakfast. Pt denies administration of actos , jardiance  and glipizide  and states administering metformin  as prescribed. Pt states he was becoming lightheaded after breakfast and discontinued the medications at that time. All Pt's questions were answered during this encounter.   BMI Readings from Last 3 Encounters:  04/24/23 31.40 kg/m  12/20/22 31.53 kg/m  10/06/22 32.48 kg/m    There were no vitals taken for this visit. There is no height or weight on file to calculate BMI.   Diabetes Self-Management Education - 07/03/23 1202       Visit Information   Visit Type First/Initial      Initial Visit   Diabetes Type Type 2    Date Diagnosed age 29    Are you currently following a meal plan? No    Are you taking your medications as prescribed? No      Health Coping   How would you rate your overall health? Good      Psychosocial Assessment   Patient Belief/Attitude about Diabetes Motivated to manage diabetes    What is the hardest part about your diabetes right now, causing you the most concern, or is the most worrisome to you about your diabetes?   Taking/obtaining medications;Making healty food and beverage choices;Checking blood sugar;Being active;Getting support / problem solving    Self-care barriers None    Self-management support Doctor's office    Other persons  present Patient    Patient Concerns Nutrition/Meal planning;Monitoring;Medication;Healthy Lifestyle    Special Needs None    Preferred Learning Style No preference indicated    Learning Readiness Ready    How often do you need to have someone help you when you read instructions, pamphlets, or other written materials from your doctor or pharmacy? 1 - Never    What is the last grade level you completed in school? 12th      Pre-Education Assessment   Patient understands the diabetes disease and treatment process. Needs Instruction    Patient understands incorporating nutritional management into lifestyle. Needs Instruction    Patient undertands incorporating physical activity into lifestyle. Needs Instruction    Patient understands using medications safely. Needs Instruction    Patient understands monitoring blood glucose, interpreting and using results Needs Instruction    Patient understands prevention, detection, and treatment of acute complications. Needs Instruction    Patient understands prevention, detection, and treatment of chronic complications. Needs Instruction    Patient understands how to develop strategies to address psychosocial issues. Needs Instruction    Patient understands how to develop strategies to promote health/change behavior. Needs Instruction      Complications   Last HgB A1C per patient/outside source 7.2 %    How often do you check your blood sugar? 0 times/day (not testing)    Have you had a dilated eye exam in the past 12 months? Yes    Have  you had a dental exam in the past 12 months? No    Are you checking your feet? Yes    How many days per week are you checking your feet? 7      Dietary Intake   Breakfast egg and cheese biscuit from [employer], gatorade zero, water, sprite zero eggs with grits or cherrios or rasin bran cereal with 2%milk    Snack (morning) nabs peanut butter, water or diet dr pepper    Lunch eats out 5/d/w chinese or sub , un sweet tea  with splenda or weekends turkey, 2 sloices of whit e or honey wheat bread, cheese, mustard    Snack (afternoon) lil debbie oat meal cream pie    Dinner quesadilla or grilled cheese sandwich or chicken noodle or toamoa soups or 2-3 perpperoni with mushroom slices of pizza with salad    Beverage(s) water, diet, tea with splenda, gatorade zero      Activity / Exercise   Activity / Exercise Type ADL's;Light (walking / raking leaves)      Patient Education   Previous Diabetes Education No    Disease Pathophysiology Definition of diabetes, type 1 and 2, and the diagnosis of diabetes;Explored patient's options for treatment of their diabetes    Healthy Eating Plate Method;Food label reading, portion sizes and measuring food.;Role of diet in the treatment of diabetes and the relationship between the three main macronutrients and blood glucose level;Reviewed blood glucose goals for pre and post meals and how to evaluate the patients' food intake on their blood glucose level.;Carbohydrate counting    Being Active Role of exercise on diabetes management, blood pressure control and cardiac health.    Medications Reviewed patients medication for diabetes, action, purpose, timing of dose and side effects.    Monitoring Daily foot exams;Identified appropriate SMBG and/or A1C goals.    Acute complications Taught prevention, symptoms, and  treatment of hypoglycemia - the 15 rule.    Chronic complications Dental care;Retinopathy and reason for yearly dilated eye exams    Diabetes Stress and Support Role of stress on diabetes    Lifestyle and Health Coping Lifestyle issues that need to be addressed for better diabetes care      Individualized Goals (developed by patient)   Nutrition Follow meal plan discussed    Medications Other (comment)   discuss med concerns with prescribing MD   Monitoring  Test my blood glucose as discussed    Problem Solving Sleep Pattern;Eating Pattern;Addressing barriers to behavior  change    Reducing Risk do foot checks daily;treat hypoglycemia with 15 grams of carbs if blood glucose less than 70mg /dL    Health Coping Ask for help with psychological, social, or emotional issues      Post-Education Assessment   Patient understands the diabetes disease and treatment process. Needs Review    Patient understands incorporating nutritional management into lifestyle. Needs Review    Patient undertands incorporating physical activity into lifestyle. Needs Review    Patient understands using medications safely. Needs Review    Patient understands monitoring blood glucose, interpreting and using results Needs Review    Patient understands prevention, detection, and treatment of acute complications. Needs Review    Patient understands prevention, detection, and treatment of chronic complications. Needs Review    Patient understands how to develop strategies to address psychosocial issues. Needs Review    Patient understands how to develop strategies to promote health/change behavior. Needs Review      Outcomes   Expected Outcomes  Demonstrated interest in learning. Expect positive outcomes    Future DMSE 2 months    Program Status Not Completed             Individualized Plan for Diabetes Self-Management Training:   Learning Objective:  Patient will have a greater understanding of diabetes self-management. Patient education plan is to attend individual and/or group sessions per assessed needs and concerns.   Plan:   Patient Instructions  1- Use the plate planner for healthy, balanced meals  9 inch plate filled 1/2 veggies, 1/4 protein, 1/4 carbohydrates     Expected Outcomes:  Demonstrated interest in learning. Expect positive outcomes  Education material provided: ADA - How to Thrive: A Guide for Your Journey with Diabetes, My Plate, and Snack sheet  If problems or questions, patient to contact team via:  Phone  Future DSME appointment: 2  months

## 2023-07-03 NOTE — Patient Instructions (Addendum)
 1- Use the plate planner for healthy, balanced meals  9 inch plate filled 1/2 veggies, 1/4 protein, 1/4 carbohydrates

## 2023-07-25 ENCOUNTER — Encounter: Payer: Self-pay | Admitting: Internal Medicine

## 2023-07-25 ENCOUNTER — Ambulatory Visit (INDEPENDENT_AMBULATORY_CARE_PROVIDER_SITE_OTHER): Payer: 59 | Admitting: Internal Medicine

## 2023-07-25 VITALS — BP 126/70 | HR 55 | Temp 98.1°F | Resp 16 | Ht 73.0 in | Wt 237.4 lb

## 2023-07-25 DIAGNOSIS — Z7984 Long term (current) use of oral hypoglycemic drugs: Secondary | ICD-10-CM

## 2023-07-25 DIAGNOSIS — E785 Hyperlipidemia, unspecified: Secondary | ICD-10-CM

## 2023-07-25 DIAGNOSIS — E538 Deficiency of other specified B group vitamins: Secondary | ICD-10-CM

## 2023-07-25 DIAGNOSIS — I10 Essential (primary) hypertension: Secondary | ICD-10-CM | POA: Diagnosis not present

## 2023-07-25 DIAGNOSIS — Z125 Encounter for screening for malignant neoplasm of prostate: Secondary | ICD-10-CM | POA: Diagnosis not present

## 2023-07-25 DIAGNOSIS — Z0001 Encounter for general adult medical examination with abnormal findings: Secondary | ICD-10-CM

## 2023-07-25 DIAGNOSIS — Z0000 Encounter for general adult medical examination without abnormal findings: Secondary | ICD-10-CM

## 2023-07-25 DIAGNOSIS — Z23 Encounter for immunization: Secondary | ICD-10-CM

## 2023-07-25 DIAGNOSIS — E1140 Type 2 diabetes mellitus with diabetic neuropathy, unspecified: Secondary | ICD-10-CM | POA: Diagnosis not present

## 2023-07-25 LAB — COMPREHENSIVE METABOLIC PANEL
ALT: 14 U/L (ref 0–53)
AST: 15 U/L (ref 0–37)
Albumin: 4.4 g/dL (ref 3.5–5.2)
Alkaline Phosphatase: 44 U/L (ref 39–117)
BUN: 20 mg/dL (ref 6–23)
CO2: 29 meq/L (ref 19–32)
Calcium: 9.2 mg/dL (ref 8.4–10.5)
Chloride: 100 meq/L (ref 96–112)
Creatinine, Ser: 0.92 mg/dL (ref 0.40–1.50)
GFR: 89.98 mL/min (ref >60.00)
Glucose, Bld: 92 mg/dL (ref 70–99)
Potassium: 4.6 meq/L (ref 3.5–5.1)
Sodium: 138 meq/L (ref 135–145)
Total Bilirubin: 0.3 mg/dL (ref 0.2–1.2)
Total Protein: 6.4 g/dL (ref 6.0–8.3)

## 2023-07-25 LAB — CBC WITH DIFFERENTIAL/PLATELET
Basophils Absolute: 0 10*3/uL (ref 0.0–0.1)
Basophils Relative: 0.5 % (ref 0.0–3.0)
Eosinophils Absolute: 0.2 10*3/uL (ref 0.0–0.7)
Eosinophils Relative: 3 % (ref 0.0–5.0)
HCT: 34.5 % — ABNORMAL LOW (ref 39.0–52.0)
Hemoglobin: 11.7 g/dL — ABNORMAL LOW (ref 13.0–17.0)
Lymphocytes Relative: 25.3 % (ref 12.0–46.0)
Lymphs Abs: 1.8 10*3/uL (ref 0.7–4.0)
MCHC: 33.8 g/dL (ref 30.0–36.0)
MCV: 92.5 fL (ref 78.0–100.0)
Monocytes Absolute: 0.6 10*3/uL (ref 0.1–1.0)
Monocytes Relative: 9.1 % (ref 3.0–12.0)
Neutro Abs: 4.4 10*3/uL (ref 1.4–7.7)
Neutrophils Relative %: 62.1 % (ref 43.0–77.0)
Platelets: 292 10*3/uL (ref 150.0–400.0)
RBC: 3.73 Mil/uL — ABNORMAL LOW (ref 4.22–5.81)
RDW: 13.4 % (ref 11.5–15.5)
WBC: 7.1 10*3/uL (ref 4.0–10.5)

## 2023-07-25 LAB — TSH: TSH: 2.36 u[IU]/mL (ref 0.35–5.50)

## 2023-07-25 LAB — PSA: PSA: 1.31 ng/mL (ref 0.10–4.00)

## 2023-07-25 LAB — HEMOGLOBIN A1C: Hgb A1c MFr Bld: 7.2 % — ABNORMAL HIGH (ref 4.6–6.5)

## 2023-07-25 LAB — B12 AND FOLATE PANEL
Folate: 22.5 ng/mL (ref >5.9)
Vitamin B-12: 977 pg/mL — ABNORMAL HIGH (ref 211–911)

## 2023-07-25 MED ORDER — VITAMIN B-12 1000 MCG PO TABS: 1000.0000 ug | ORAL_TABLET | Freq: Every day | ORAL | Status: AC

## 2023-07-25 NOTE — Patient Instructions (Addendum)
Vaccines I recommend: COVID booster  Please review the medication list to be sure is accurate.   Check the  blood pressure regularly Blood pressure goal:  between 110/65 and  135/85. If it is consistently higher or lower, let me know    GO TO THE LAB : Get the blood work     Next visit with me in 3 months Please schedule it at the front desk        "Health Care Power of attorney" ,  "Living will" (Advance care planning documents)  If you already have a living will or healthcare power of attorney, is recommended you bring the copy to be scanned in your chart.   The document will be available to all the doctors you see in the system.  Advance care planning is a process that supports adults in  understanding and sharing their preferences regarding future medical care.  The patient's preferences are recorded in documents called Advance Directives and the can be modified at any time while the patient is in full mental capacity.   If you don't have one, please consider create one.      More information at: [email or web address]

## 2023-07-25 NOTE — Progress Notes (Unsigned)
Subjective:    Patient ID: Barry Bush, male    DOB: 1961/02/20, 63 y.o.   MRN: [id number]  DOS:  07/25/2023 Type of visit - description: Here for CPX  General feeling well.  Has no major concerns. Ambulatory BPs normal. Medication reconciliation performed today.  Review of Systems See above   Past Medical History:  Diagnosis Date   Diabetes mellitus 08/26/2010   Esophageal stricture 06/27/2001   s/p dilation   Eustachian tube disorder 10/26/2014   Dr. Verne Spurr   GERD (gastroesophageal reflux disease)    Gout    Hypertension    PSORIASIS 10/16/2006   Qualifier: Diagnosis of  By: Shary Decamp      Past Surgical History:  Procedure Laterality Date   Facial Trauma      Current Outpatient Medications  Medication Instructions   allopurinol (ZYLOPRIM) 100 mg, Oral, Daily   ALPHA LIPOIC ACID PO 600 mg   amLODipine (NORVASC) 10 mg, Oral, Daily   aspirin 81 mg, Daily   atorvastatin (LIPITOR) 20 mg, Oral, Daily at bedtime   Blood Glucose Monitoring Suppl (FREESTYLE LITE) w/Device KIT 1 Device, Does not apply, As directed, Check blood sugars twice daily   calcipotriene (DOVONOX) 0.005 % cream 2 times daily   empagliflozin (JARDIANCE) 25 mg, Oral, Daily before breakfast   fluticasone (FLONASE) 50 MCG/ACT nasal spray 2 sprays, Each Nare, Daily   gabapentin (NEURONTIN) 800 mg, Oral, 3 times daily   glipiZIDE (GLIPIZIDE XL) 10 mg, Oral, Daily with breakfast   glucose blood (FREESTYLE LITE) test strip Check blood sugars twice daily   hydrochlorothiazide (HYDRODIURIL) 25 mg, Oral, Daily   Lancets (FREESTYLE) lancets Check blood sugars twice daily   loratadine (CLARITIN) 10 MG tablet TAKE 1 TABLET BY MOUTH DAILY.   losartan (COZAAR) 100 mg, Oral, Daily   metFORMIN (GLUCOPHAGE) 850 MG tablet Take 2 tablets (1,700 mg total) by mouth daily with breakfast AND 1 tablet (850 mg total) daily with supper.   metoprolol tartrate (LOPRESSOR) 100 mg, Oral, 2 times daily   omeprazole  (PRILOSEC) 20 mg, Daily   ondansetron (ZOFRAN) 4 mg, Oral, Every 8 hours PRN   pioglitazone (ACTOS) 45 mg, Oral, Daily   sildenafil (REVATIO) 20 MG tablet TAKE 4 TO 5 TABLETS BY MOUTH AT BEDTIME AS NEEDED       Objective:   Physical Exam BP 126/70   Pulse (!) 55   Temp 98.1 F (36.7 C) (Oral)   Resp 16   Ht 6\' 1"  (1.854 m)   Wt 237 lb 6 oz (107.7 kg)   SpO2 97%   BMI 31.32 kg/m  General: Well developed, NAD, BMI noted Neck: No  thyromegaly  HEENT:  Normocephalic . Face symmetric, atraumatic Lungs:  CTA B Normal respiratory effort, no intercostal retractions, no accessory muscle use. Heart: RRR,  no murmur.  Abdomen:  Not distended, soft, non-tender. No rebound or rigidity.   Lower extremities: no pretibial edema bilaterally  Skin: Exposed areas without rash. Not pale. Not jaundice Neurologic:  alert & oriented X3.  Speech normal, gait appropriate for age and unassisted Strength symmetric and appropriate for age.  Psych: Cognition and judgment appear intact.  Cooperative with normal attention span and concentration.  Behavior appropriate. No anxious or depressed appearing.     Assessment     Assessment DM  dx 2012 Mild neuropathy dx 10-2015, saw neuro, 08-2020, extensive blood work (-) NCS confirmed mild neuropathy, likely DM related. HTN GERD Esophageal stricture, dilatation 2003 Gout (had  to start HCTZ around 2021 due to uncontrolled BP) Psoriasis  Eustachian  tube disorder, saw ENT 2016 B12 deficiency DX 02-2018 + FH CAD + Lyme serology 01/2022 , d/w  ID, Rx doxycycline   PLAN:  Here for CPX - Td 07/25/2023 -  PNM 2015; PNM 13: 2016 - s/p shingrix  -  had a flu shot - rec covid booster, pro>cons  -- CCS:  cscope 04-2013, due, already contacted by [facility] --Prostate cancer screening: No symptoms, check PSA -- Encourage exercise 3 hours a week, watch carbohydrate intake and portion controls. -Labs: CMP CBC A1c TSH PSA -Recommend to get Healthcare  POA. Chronic medical problems addressed:  DM: Today reports he has not taken Jardiance in a while, he does not know why.  He also quit glipizide because he was feeling dizzy when he take it (CBGs were ok when checked). Ambulatory CBGs 120, 140. Plan: Continue metformin, pioglitazone, check A1c. Neuropathy: Self stopped gabapentin, it was ineffective. HTN: Ambulatory BPs normal when checked, continue amlodipine, HCTZ, losartan, metoprolol.  Check BMP and CBC. B12 deficiencies: Check labs. On supplements  Gout: No recent episodes,   continue allopurinol, last uric acid 7.7. RTC 3      ===  10= DM:  Last A1c 7.2, pioglitazone increased to 45 mg daily.  Also on Jardiance, glipizide, metformin. Amb CBGs fasting 80-120, postprandial 150s. Diet: Room for improvement.  Education about a healthy diet provided, will refer him to a nutritionist.  Check A1c, micro.  Further advised for results. HTN: BP is very good, amb BPs typically WNL.  No change. High cholesterol: Based on last FLP, atorvastatin increased to 20 mg.  Checking labs. ED: RF sildenafil Gout: See previous entry, last uric acid 7.7 Preventive care: Flu shot today, declined the COVID-vaccine, benefits discussed RTC 06-2023 CPX

## 2023-07-26 ENCOUNTER — Encounter: Payer: Self-pay | Admitting: Internal Medicine

## 2023-07-26 NOTE — Assessment & Plan Note (Signed)
Here for CPX Chronic medical problems addressed: DM: Today reports he has not taken Jardiance in a while, he does not know why.  He also quit glipizide because he was feeling dizzy when he take it (CBGs were ok when checked). Ambulatory CBGs 120, 140. Plan: Continue metformin, pioglitazone, check A1c.  Further advised with results Neuropathy: Self stopped gabapentin, it was ineffective. HTN: Ambulatory BPs normal when checked, continue amlodipine, HCTZ, losartan, metoprolol.  Check BMP and CBC. B12 deficiencies: Check labs. On supplements  Gout: No recent episodes,   continue allopurinol, last uric acid 7.7. RTC 3

## 2023-07-26 NOTE — Assessment & Plan Note (Signed)
Here for CPX - Td 07/25/2023 -  PNM 2015; PNM 13: 2016 - s/p shingrix  -  had a flu shot - rec covid booster, pro>cons  -- CCS:  cscope 04-2013, due, already contacted by D r [address] --Prostate cancer screening: No symptoms, check PSA -- Encourage exercise 3 hours a week, watch carbohydrate intake and work on portion controls. -Labs: CMP CBC A1c TSH PSA -Recommend to get Healthcare POA.

## 2023-07-28 ENCOUNTER — Other Ambulatory Visit: Payer: Self-pay

## 2023-07-28 ENCOUNTER — Encounter: Payer: Self-pay | Admitting: Internal Medicine

## 2023-07-28 ENCOUNTER — Ambulatory Visit (INDEPENDENT_AMBULATORY_CARE_PROVIDER_SITE_OTHER): Payer: 59

## 2023-07-28 DIAGNOSIS — D649 Anemia, unspecified: Secondary | ICD-10-CM | POA: Diagnosis not present

## 2023-07-28 LAB — IBC + FERRITIN
Ferritin: 31.3 ng/mL (ref 22.0–322.0)
Iron: 69 ug/dL (ref 42–165)
Saturation Ratios: 16.9 % — ABNORMAL LOW (ref 20.0–50.0)
TIBC: 407.4 ug/dL (ref 250.0–450.0)
Transferrin: 291 mg/dL (ref 212.0–360.0)

## 2023-07-28 MED ORDER — EMPAGLIFLOZIN 10 MG PO TABS
10.0000 mg | ORAL_TABLET | Freq: Every day | ORAL | 1 refills | Status: DC
Start: 2023-07-28 — End: 2023-10-19

## 2023-07-28 NOTE — Addendum Note (Signed)
Addended byConrad Newport D on: 07/28/2023 04:02 PM   Modules accepted: Orders

## 2023-07-29 ENCOUNTER — Other Ambulatory Visit: Payer: Self-pay | Admitting: Internal Medicine

## 2023-08-03 DIAGNOSIS — Z1211 Encounter for screening for malignant neoplasm of colon: Secondary | ICD-10-CM | POA: Diagnosis not present

## 2023-08-03 DIAGNOSIS — E669 Obesity, unspecified: Secondary | ICD-10-CM | POA: Diagnosis not present

## 2023-08-03 DIAGNOSIS — E782 Mixed hyperlipidemia: Secondary | ICD-10-CM | POA: Diagnosis not present

## 2023-08-03 DIAGNOSIS — K219 Gastro-esophageal reflux disease without esophagitis: Secondary | ICD-10-CM | POA: Diagnosis not present

## 2023-08-03 DIAGNOSIS — E119 Type 2 diabetes mellitus without complications: Secondary | ICD-10-CM | POA: Diagnosis not present

## 2023-08-03 DIAGNOSIS — I10 Essential (primary) hypertension: Secondary | ICD-10-CM | POA: Diagnosis not present

## 2023-08-06 ENCOUNTER — Other Ambulatory Visit: Payer: Self-pay | Admitting: Physical Medicine and Rehabilitation

## 2023-08-06 ENCOUNTER — Other Ambulatory Visit: Payer: Self-pay | Admitting: Internal Medicine

## 2023-09-09 ENCOUNTER — Other Ambulatory Visit: Payer: Self-pay | Admitting: Internal Medicine

## 2023-09-13 ENCOUNTER — Encounter: Payer: Self-pay | Admitting: *Deleted

## 2023-09-13 DIAGNOSIS — E119 Type 2 diabetes mellitus without complications: Secondary | ICD-10-CM | POA: Diagnosis not present

## 2023-09-13 DIAGNOSIS — K648 Other hemorrhoids: Secondary | ICD-10-CM | POA: Diagnosis not present

## 2023-09-13 DIAGNOSIS — Z1211 Encounter for screening for malignant neoplasm of colon: Secondary | ICD-10-CM | POA: Diagnosis not present

## 2023-09-13 DIAGNOSIS — K5730 Diverticulosis of large intestine without perforation or abscess without bleeding: Secondary | ICD-10-CM | POA: Diagnosis not present

## 2023-09-13 LAB — HM COLONOSCOPY

## 2023-10-03 ENCOUNTER — Other Ambulatory Visit: Payer: Self-pay | Admitting: Internal Medicine

## 2023-10-12 ENCOUNTER — Ambulatory Visit: Payer: 59 | Admitting: Family Medicine

## 2023-10-18 NOTE — Progress Notes (Unsigned)
 No chief complaint on file.   HISTORY OF PRESENT ILLNESS:  10/18/23 ALL:  Barry Bush returns for follow up for neuropathy.   10/06/2022 ALL:  Barry Bush returns for follow up for neuropathy. He was last seen 03/2022 and referred for consideration for Qutenza. He continues gabapentin  800mg  TID per PCP. He is taking ALA. Not sure if it helped. Pain seems about the same. He has reduced dose of gabapentin  to 800mg  in am and 400 at bedtime. He switched insurance coverage and Qutenza not covered. Topiramate  started but he could not tolerate taste changes. Forearms are stable. A1C  7 06/2022. He continues to use diabetic socks and shoes. He is sleeping well. He stays active.   03/28/2022 ALL: Barry Bush is a 63 y.o. male here today for follow up for diabetic neuropathy. He was last seen by Dr Omar Bibber and doing well on gabapentin  800mg  BID written by PCP. Since, he reports dose was increased to 800mg  TID. He is doing failry well. No significant worsening. He continues to have burning/stinging pain in both feet. He remains active. He is wearing diabetic socks and shoes. He alternates between 4 pairs of diabetic shoes. He does feel this helps. He does note,, over the past few months that he has decreased sensation to the radial area of wrists, bilaterally, that runs to mid forearm. No radiation to hand or fingers. No weakness noted. Last A1C was 6.7. He owns a Occupational psychologist. No obvious injuries.   HISTORY (copied from Dr Dail Drought previous note)  Barry Bush is a 63 year old right-handed gentleman with an underlying medical history of hypertension, reflux disease, gout, psoriasis, diabetes and obesity, who presents for follow-up consultation of his neuropathy.  The patient is unaccompanied today.  I first met him at the request of his primary care physician on 08/27/2020, at which time he reported a several year history of approximately 6 to 7 years of paresthesias affecting his feet.  He had had recent blood  work through his primary care physician.  Examination showed decreased sensation to pinprick and vibration in the distal lower extremities bilaterally.  He was on gabapentin  which had been started by his primary care physician and he was on 600 mg twice daily at the time.  A1c has been below 7, he had a positive ANA and was advised to talk to his primary care physician about further evaluation for this.  I suggested we proceed with EMG and nerve conduction velocity testing as well as additional blood work through our office.  He was advised to continue with gabapentin .  He was advised to consider seeing dermatology for his psoriasis which had become worse.     Blood work from 08/27/2020 included multiple myeloma panel, vitamin B1, vitamin B6, RPR, ESR, heavy metal profile, vitamin D .  Labs were benign and he was notified.   He had and EMG/NCV test through our office on 09/09/20 and I reviewed the results:    Conclusion: This is an abnormal study.  There is electrodiagnostic evidence of mild length dependent axonal neuropathy, mainly involving sensory component.   We called him with his test results.    Today, 12/03/20: He reports feeling stable.  His primary care physician increased the gabapentin  to 800 mg twice daily.  He reports doing well with that and it is helpful.  He has not had any major side effects from it.  Symptoms are about the same, he has not had any recent falls.  He did see dermatology in  April and is waiting to start Stelara, previously was on Otezla with good success but insurance did not cover it any longer.   REVIEW OF SYSTEMS: Out of a complete 14 system review of symptoms, the patient complains only of the following symptoms, numbness, burning, tingling of bilateral feet, numbness of wrists bilaterally and all other reviewed systems are negative.   ALLERGIES: No Known Allergies   HOME MEDICATIONS: Outpatient Medications Prior to Visit  Medication Sig Dispense Refill    allopurinol  (ZYLOPRIM ) 100 MG tablet Take 1 tablet (100 mg total) by mouth daily. 90 tablet 1   ALPHA LIPOIC ACID PO Take 600 mg by mouth.     amLODipine  (NORVASC ) 10 MG tablet Take 1 tablet (10 mg total) by mouth daily. 30 tablet 6   aspirin 81 MG tablet Take 81 mg by mouth daily.     atorvastatin  (LIPITOR) 20 MG tablet Take 1 tablet (20 mg total) by mouth at bedtime. 90 tablet 1   Blood Glucose Monitoring Suppl (FREESTYLE LITE) w/Device KIT 1 Device by Does not apply route as directed. Check blood sugars twice daily 1 kit 0   calcipotriene (DOVONOX) 0.005 % cream Apply topically 2 (two) times daily.     cyanocobalamin  (VITAMIN B12) 1000 MCG tablet Take 1 tablet (1,000 mcg total) by mouth daily.     empagliflozin  (JARDIANCE ) 10 MG TABS tablet Take 1 tablet (10 mg total) by mouth daily before breakfast. 90 tablet 1   fluticasone  (FLONASE ) 50 MCG/ACT nasal spray Place 2 sprays into both nostrils daily. (Patient not taking: Reported on 07/25/2023) 16 g 0   glucose blood (FREESTYLE LITE) test strip Check blood sugars twice daily 200 each 12   hydrochlorothiazide (HYDRODIURIL) 25 MG tablet Take 1 tablet (25 mg total) by mouth daily. 90 tablet 1   Lancets (FREESTYLE) lancets Check blood sugars twice daily 200 each 12   loratadine  (CLARITIN ) 10 MG tablet TAKE 1 TABLET BY MOUTH DAILY. (Patient not taking: Reported on 07/25/2023) 30 tablet 0   losartan  (COZAAR ) 100 MG tablet Take 1 tablet (100 mg total) by mouth daily. 90 tablet 1   metFORMIN  (GLUCOPHAGE ) 850 MG tablet TAKE 2 TABLETS BY MOUTH DAILY WITH BREAKFAST AND 1 TABLET (850 MG TOTAL) DAILY WITH SUPPER. 270 tablet 1   metoprolol  tartrate (LOPRESSOR ) 100 MG tablet Take 1 tablet (100 mg total) by mouth 2 (two) times daily. 180 tablet 1   pioglitazone  (ACTOS ) 45 MG tablet Take 1 tablet (45 mg total) by mouth daily. (Patient not taking: Reported on 07/25/2023) 90 tablet 1   sildenafil  (REVATIO ) 20 MG tablet TAKE 4 TO 5 TABLETS BY MOUTH AT BEDTIME AS NEEDED  (Patient not taking: Reported on 07/25/2023) 60 tablet 3   No facility-administered medications prior to visit.     PAST MEDICAL HISTORY: Past Medical History:  Diagnosis Date   Diabetes mellitus 08/26/2010   Esophageal stricture 06/27/2001   s/p dilation   Eustachian tube disorder 10/26/2014   Dr. Valiant Gaul   GERD (gastroesophageal reflux disease)    Gout    Hypertension    PSORIASIS 10/16/2006   Qualifier: Diagnosis of  By: Joen Muskrat       PAST SURGICAL HISTORY: Past Surgical History:  Procedure Laterality Date   Facial Trauma       FAMILY HISTORY: Family History  Problem Relation Age of Onset   Diabetes Mother    Diabetes Father    Heart attack Father        age 25, 2  stents   Lung cancer Other        GM, smoker   Melanoma Other        GF   Colon cancer Neg Hx    Prostate cancer Neg Hx      SOCIAL HISTORY: Social History   Socioeconomic History   Marital status: Married    Spouse name: Not on file   Number of children: 1   Years of education: Not on file   Highest education level: 12th grade  Occupational History   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  Tobacco Use   Smoking status: Never   Smokeless tobacco: Never  Vaping Use   Vaping status: Never Used  Substance and Sexual Activity   Alcohol use: Yes    Comment: 3 days a week   Drug use: No   Sexual activity: Not on file  Other Topics Concern   Not on file  Social History Narrative   Household- pt and wife   Right handed    Caffeine: 2 cups/day   Social Drivers of Corporate investment banker Strain: Low Risk  (04/24/2023)   Overall Financial Resource Strain (CARDIA)    Difficulty of Paying Living Expenses: Not hard at all  Food Insecurity: No Food Insecurity (07/03/2023)   Hunger Vital Sign    Worried About Running Out of Food in the Last Year: Never true    Ran Out of Food in the Last Year: Never true  Transportation Needs: No Transportation Needs  (04/24/2023)   PRAPARE - Administrator, Civil Service (Medical): No    Lack of Transportation (Non-Medical): No  Physical Activity: Insufficiently Active (04/24/2023)   Exercise Vital Sign    Days of Exercise per Week: 2 days    Minutes of Exercise per Session: 10 min  Stress: No Stress Concern Present (04/24/2023)   [employer] of Occupational Health - Occupational Stress Questionnaire    Feeling of Stress : Not at all  Social Connections: Moderately Integrated (04/24/2023)   Social Connection and Isolation Panel [NHANES]    Frequency of Communication with Friends and Family: More than three times a week    Frequency of Social Gatherings with Friends and Family: Twice a week    Attends Religious Services: 1 to 4 times per year    Active Member of [employer] or Organizations: No    Attends Engineer, structural: Not on file    Marital Status: Married  Catering manager Violence: Not on file     PHYSICAL EXAM  There were no vitals filed for this visit.   There is no height or weight on file to calculate BMI.  Generalized: Well developed, in no acute distress  Cardiology: normal rate and rhythm, no murmur auscultated  Respiratory: clear to auscultation bilaterally    Neurological examination  Mentation: Alert oriented to time, place, history taking. Follows all commands speech and language fluent Cranial nerve II-XII: Pupils were equal round reactive to light. Extraocular movements were full, visual field were full on confrontational test. Facial sensation and strength were normal. Head turning and shoulder shrug  were normal and symmetric. Motor: The motor testing reveals 5 over 5 strength of all 4 extremities. Good symmetric motor tone is noted throughout.  Sensory: Sensory testing is intact to soft touch on all 4 extremities with exception of sensation to bilateral radial heads radiating along radius to mid forearm. No evidence of extinction is noted.  Gait and station: Gait is normal.    DIAGNOSTIC DATA (LABS, IMAGING, TESTING) - I reviewed patient records, labs, notes, testing and imaging myself where available.  Lab Results  Component Value Date   WBC 7.1 07/25/2023   HGB 11.7 (L) 07/25/2023   HCT 34.5 (L) 07/25/2023   MCV 92.5 07/25/2023   PLT 292.0 07/25/2023      Component Value Date/Time   NA 138 07/25/2023 1338   K 4.6 07/25/2023 1338   CL 100 07/25/2023 1338   CO2 29 07/25/2023 1338   GLUCOSE 92 07/25/2023 1338   BUN 20 07/25/2023 1338   CREATININE 0.92 07/25/2023 1338   CREATININE 0.88 03/06/2020 0722   CALCIUM  9.2 07/25/2023 1338   PROT 6.4 07/25/2023 1338   PROT 6.8 08/27/2020 0850   ALBUMIN 4.4 07/25/2023 1338   AST 15 07/25/2023 1338   ALT 14 07/25/2023 1338   ALKPHOS 44 07/25/2023 1338   BILITOT 0.3 07/25/2023 1338   GFRNONAA >89 10/27/2015 1600   GFRAA >89 10/27/2015 1600   Lab Results  Component Value Date   CHOL 134 04/24/2023   HDL 57.60 04/24/2023   LDLCALC 54 04/24/2023   LDLDIRECT 138.4 03/28/2011   TRIG 110.0 04/24/2023   CHOLHDL 2 04/24/2023   Lab Results  Component Value Date   HGBA1C 7.2 (H) 07/25/2023   Lab Results  Component Value Date   VITAMINB12 977 (H) 07/25/2023   Lab Results  Component Value Date   TSH 2.36 07/25/2023        No data to display               No data to display           ASSESSMENT AND PLAN  63 y.o. year old male  has a past medical history of Diabetes mellitus (08/26/2010), Esophageal stricture (06/27/2001), Eustachian tube disorder (10/26/2014), GERD (gastroesophageal reflux disease), Gout, Hypertension, and PSORIASIS (10/16/2006). here with    No diagnosis found.  Barry Bush is doing well, today. Neuropathy is stable. I have encouraged him to continue ALA and gabapentin  up to 800mg  TID per PCP. Could consider Qutenza in future if coverage changes. Consider repeat NCS/EMG if forearm symptoms worsen. Healthy lifestyle habits  encouraged. He will follow up with PCP as directed. He will return to see me in 1 year, sooner if needed. He verbalizes understanding and agreement with this plan.   No orders of the defined types were placed in this encounter.    No orders of the defined types were placed in this encounter.    Terrilyn Fick, MSN, FNP-C 10/18/2023, 11:33 AM  [facility] [address] [place], [place] [address] [phone]

## 2023-10-18 NOTE — Patient Instructions (Signed)
 Below is our plan:  We will continue to monitor. Consider OTC Salanpas with capsaicin, infrared therapy or epson salt baths. We can look at Lyrica or oxcarbazepine in the future if needed.    Please make sure you are staying well hydrated. I recommend 50-60 ounces daily. Well balanced diet and regular exercise encouraged. Consistent sleep schedule with 6-8 hours recommended.   Please continue follow up with care team as directed.   Follow up with me in 1 year   You may receive a survey regarding today's visit. I encourage you to leave honest feed back as I do use this information to improve patient care. Thank you for seeing me today!

## 2023-10-19 ENCOUNTER — Encounter: Payer: Self-pay | Admitting: Family Medicine

## 2023-10-19 ENCOUNTER — Ambulatory Visit: Payer: 59 | Admitting: Family Medicine

## 2023-10-19 VITALS — BP 120/82 | HR 57 | Ht 73.0 in | Wt 235.0 lb

## 2023-10-19 DIAGNOSIS — Z7984 Long term (current) use of oral hypoglycemic drugs: Secondary | ICD-10-CM | POA: Diagnosis not present

## 2023-10-19 DIAGNOSIS — E1140 Type 2 diabetes mellitus with diabetic neuropathy, unspecified: Secondary | ICD-10-CM

## 2023-10-20 ENCOUNTER — Other Ambulatory Visit: Payer: Self-pay | Admitting: Internal Medicine

## 2023-10-27 ENCOUNTER — Encounter: Payer: Self-pay | Admitting: Internal Medicine

## 2023-10-27 ENCOUNTER — Ambulatory Visit: Payer: 59 | Admitting: Internal Medicine

## 2023-10-27 VITALS — BP 126/68 | HR 58 | Temp 98.4°F | Resp 16 | Ht 73.0 in | Wt 233.5 lb

## 2023-10-27 DIAGNOSIS — M109 Gout, unspecified: Secondary | ICD-10-CM | POA: Diagnosis not present

## 2023-10-27 DIAGNOSIS — E1140 Type 2 diabetes mellitus with diabetic neuropathy, unspecified: Secondary | ICD-10-CM

## 2023-10-27 DIAGNOSIS — Z7984 Long term (current) use of oral hypoglycemic drugs: Secondary | ICD-10-CM

## 2023-10-27 DIAGNOSIS — I10 Essential (primary) hypertension: Secondary | ICD-10-CM | POA: Diagnosis not present

## 2023-10-27 DIAGNOSIS — D649 Anemia, unspecified: Secondary | ICD-10-CM | POA: Diagnosis not present

## 2023-10-27 LAB — BASIC METABOLIC PANEL WITH GFR
BUN: 23 mg/dL (ref 6–23)
CO2: 29 meq/L (ref 19–32)
Calcium: 9.5 mg/dL (ref 8.4–10.5)
Chloride: 99 meq/L (ref 96–112)
Creatinine, Ser: 1.09 mg/dL (ref 0.40–1.50)
GFR: 73.28 mL/min (ref >60.00)
Glucose, Bld: 152 mg/dL — ABNORMAL HIGH (ref 70–99)
Potassium: 4.6 meq/L (ref 3.5–5.1)
Sodium: 138 meq/L (ref 135–145)

## 2023-10-27 LAB — IBC + FERRITIN
Ferritin: 32.6 ng/mL (ref 22.0–322.0)
Iron: 77 ug/dL (ref 42–165)
Saturation Ratios: 17.1 % — ABNORMAL LOW (ref 20.0–50.0)
TIBC: 450.8 ug/dL — ABNORMAL HIGH (ref 250.0–450.0)
Transferrin: 322 mg/dL (ref 212.0–360.0)

## 2023-10-27 LAB — URIC ACID: Uric Acid, Serum: 7 mg/dL (ref 4.0–7.8)

## 2023-10-27 LAB — HEMOGLOBIN A1C: Hgb A1c MFr Bld: 7.1 % — ABNORMAL HIGH (ref 4.6–6.5)

## 2023-10-27 NOTE — Progress Notes (Unsigned)
 Subjective:    Patient ID: Barry Bush, male    DOB: 05/26/1961, 63 y.o.   MRN: [id number]  DOS:  10/27/2023 Type of visit - description: Follow-up  Routine follow-up Chronic medical problems addressed. Ambulatory BPs, ambulatory CBGs: WNL. No recent gout events. Saw neurology, note reviewed.  Has mild anemia, denies nausea vomiting diarrhea.  No blood in the stools, no abdominal pain. Recent colonoscopy reviewed  Review of Systems See above   Past Medical History:  Diagnosis Date   Diabetes mellitus 08/26/2010   Esophageal stricture 06/27/2001   s/p dilation   Eustachian tube disorder 10/26/2014   Dr. Valiant Gaul   GERD (gastroesophageal reflux disease)    Gout    Hypertension    PSORIASIS 10/16/2006   Qualifier: Diagnosis of  By: Joen Muskrat      Past Surgical History:  Procedure Laterality Date   Facial Trauma      Current Outpatient Medications  Medication Instructions   allopurinol  (ZYLOPRIM ) 100 mg, Oral, Daily   ALPHA LIPOIC ACID PO 600 mg   amLODipine  (NORVASC ) 10 mg, Oral, Daily   aspirin 81 mg, Daily   atorvastatin  (LIPITOR) 20 mg, Oral, Daily at bedtime   Blood Glucose Monitoring Suppl (FREESTYLE LITE) w/Device KIT 1 Device, Does not apply, As directed, Check blood sugars twice daily   calcipotriene (DOVONOX) 0.005 % cream 2 times daily   cyanocobalamin  (VITAMIN B12) 1,000 mcg, Oral, Daily   glucose blood (FREESTYLE LITE) test strip Check blood sugars twice daily   hydrochlorothiazide (HYDRODIURIL) 25 mg, Oral, Daily   Lancets (FREESTYLE) lancets Check blood sugars twice daily   loratadine  (CLARITIN ) 10 MG tablet TAKE 1 TABLET BY MOUTH DAILY.   losartan  (COZAAR ) 100 mg, Oral, Daily   metFORMIN  (GLUCOPHAGE ) 850 MG tablet TAKE 2 TABLETS BY MOUTH DAILY WITH BREAKFAST AND 1 TABLET (850 MG TOTAL) DAILY WITH SUPPER.   metoprolol  tartrate (LOPRESSOR ) 100 mg, Oral, 2 times daily   pioglitazone  (ACTOS ) 45 mg, Oral, Daily   sildenafil  (REVATIO ) 20 MG  tablet TAKE 4 TO 5 TABLETS BY MOUTH AT BEDTIME AS NEEDED       Objective:   Physical Exam BP 126/68   Pulse (!) 58   Temp 98.4 F (36.9 C) (Oral)   Resp 16   Ht 6\' 1"  (1.854 m)   Wt 233 lb 8 oz (105.9 kg)   SpO2 96%   BMI 30.81 kg/m  General:   Well developed, NAD, BMI noted. HEENT:  Normocephalic . Face symmetric, atraumatic Lungs:  CTA B Normal respiratory effort, no intercostal retractions, no accessory muscle use. Heart: RRR,  no murmur.  DM foot exam: Warm, good capillary refills, distal pinprick examination decreased Skin: Not pale. Not jaundice Neurologic:  alert & oriented X3.  Speech normal, gait appropriate for age and unassisted Psych--  Cognition and judgment appear intact.  Cooperative with normal attention span and concentration.  Behavior appropriate. No anxious or depressed appearing.      Assessment     Problem list: DM  dx 2012 Mild neuropathy dx 10-2015, saw neuro, 08-2020, extensive blood work (-) NCS confirmed mild neuropathy, likely DM related. HTN GERD Esophageal stricture, dilatation 2003 Gout (had to start HCTZ around 2021 due to uncontrolled BP) Psoriasis  Eustachian  tube disorder, saw ENT 2016 B12 deficiency DX 02-2018 + FH CAD + Lyme serology 01/2022 , d/w  ID, Rx doxycycline   PLAN:  DM: Last A1c 7.2, on  metformin , pioglitazone .  Previously self stopped glipizide  due to  dizziness.  Was recommended to restart Jardiance  but didn't $$$. Ambulatory CBGs in the 120s. Plan: A1c, consider add a generic medication, glipizide ?   HTN: Ambulatory BP is really good,, Continue amlodipine , losartan , metoprolol .  Check BMP Neuropathy: Saw neurology, gabapentin  was ineffective, was recommended Salonpas OTC.   Feet exam today confirms neuropathy, states follows appropriate feet care. B12 deficiency: Last levels very good. Mild chronic anemia:  Last hemoglobin decreased from 12.9 to 11.7.  No GI symptoms. Had a cscope 09/13/2023, no polyps, 10  years Check CBC and anemia panel Gout: No recent events, on allopurinol  100 mg, check a uric acid. RTC 4 months

## 2023-10-27 NOTE — Patient Instructions (Signed)
 INSTRUCTIONS  FOR TODAY   Check the  blood pressure regularly Blood pressure goal:  between 110/65 and  135/85. If it is consistently higher or lower, let me know   Diabetes: You can check your sugars at different times  - early in AM fasting  ( blood sugar goal 70-130) - 2 hours after a meal (blood sugar goal less than 180)     GO TO THE LAB : Get the blood work     Next office visit for a checkup in 4 months Please make an appointment before you leave today Depending on your blood or XRs results it might be necessary to come back sooner

## 2023-10-28 ENCOUNTER — Other Ambulatory Visit: Payer: Self-pay | Admitting: Internal Medicine

## 2023-10-28 LAB — CBC WITH DIFFERENTIAL/PLATELET
Basophils Absolute: 0.1 10*3/uL (ref 0.0–0.1)
Basophils Relative: 0.8 % (ref 0.0–3.0)
Eosinophils Absolute: 0.2 10*3/uL (ref 0.0–0.7)
Eosinophils Relative: 2.5 % (ref 0.0–5.0)
HCT: 37.1 % — ABNORMAL LOW (ref 39.0–52.0)
Hemoglobin: 12.3 g/dL — ABNORMAL LOW (ref 13.0–17.0)
Lymphocytes Relative: 19.7 % (ref 12.0–46.0)
Lymphs Abs: 1.3 10*3/uL (ref 0.7–4.0)
MCHC: 33.1 g/dL (ref 30.0–36.0)
MCV: 94.1 fl (ref 78.0–100.0)
Monocytes Absolute: 0.6 10*3/uL (ref 0.1–1.0)
Monocytes Relative: 8.4 % (ref 3.0–12.0)
Neutro Abs: 4.6 10*3/uL (ref 1.4–7.7)
Neutrophils Relative %: 68.6 % (ref 43.0–77.0)
Platelets: 275 10*3/uL (ref 150.0–400.0)
RBC: 3.94 Mil/uL — ABNORMAL LOW (ref 4.22–5.81)
RDW: 14.4 % (ref 11.5–15.5)
WBC: 6.7 10*3/uL (ref 4.0–10.5)

## 2023-10-28 NOTE — Assessment & Plan Note (Signed)
 DM: Last A1c 7.2, on  metformin , pioglitazone .  Previously self stopped glipizide  due to dizziness.  Was recommended to restart Jardiance  but didn't $$$. Ambulatory CBGs in the 120s. Plan: A1c, consider add a generic medication, glipizide ?   HTN: Ambulatory BP is really good,, Continue amlodipine , losartan , metoprolol .  Check BMP Neuropathy: Saw neurology, gabapentin  was ineffective, was recommended Salonpas OTC.   Feet exam today confirms neuropathy, states follows appropriate feet care. B12 deficiency: Last levels very good. Mild chronic anemia:  Last hemoglobin decreased from 12.9 to 11.7.  No GI symptoms. Had a cscope 09/13/2023, no polyps, 10 years Check CBC and anemia panel Gout: No recent events, on allopurinol  100 mg, check a uric acid. RTC 4 months

## 2023-10-30 ENCOUNTER — Encounter: Payer: Self-pay | Admitting: Internal Medicine

## 2023-10-30 MED ORDER — GLIPIZIDE ER 2.5 MG PO TB24: 2.5000 mg | ORAL_TABLET | Freq: Every day | ORAL | 1 refills | Status: DC

## 2023-10-30 NOTE — Addendum Note (Signed)
 Addended by: Ebany Bowermaster D on: 10/30/2023 01:13 PM   Modules accepted: Orders

## 2023-11-08 ENCOUNTER — Other Ambulatory Visit: Payer: Self-pay | Admitting: Internal Medicine

## 2023-11-10 ENCOUNTER — Telehealth: Payer: Self-pay

## 2023-11-10 ENCOUNTER — Other Ambulatory Visit (HOSPITAL_COMMUNITY): Payer: Self-pay

## 2023-11-10 NOTE — Telephone Encounter (Signed)
 Pharmacy Patient Advocate Encounter   Received notification from CoverMyMeds that prior authorization for Sildenafil  Citrate (PAH) 20MG  tablets is required/requested.   Insurance verification completed.   The patient is insured through [employer] .   Per test claim: PA required; PA submitted to above mentioned insurance via CoverMyMeds Key/confirmation #/EOC [facility] Status is pending

## 2023-11-21 ENCOUNTER — Other Ambulatory Visit (HOSPITAL_COMMUNITY): Payer: Self-pay

## 2023-11-21 NOTE — Telephone Encounter (Signed)
 Pharmacy Patient Advocate Encounter  Received notification from [employer] that Prior Authorization for Sildenafil  Citrate [facility]) 20MG  tablets i  has been DENIED.  See denial reason below. No denial letter attached in CMM. Will attach denial letter to Media tab once received.   PA #/Case ID/Reference #: [id number]   *not covered for erectile dysfunction, patient will have to pay out of pocket using good rx.

## 2024-02-04 ENCOUNTER — Other Ambulatory Visit: Payer: Self-pay | Admitting: Internal Medicine

## 2024-02-27 ENCOUNTER — Ambulatory Visit: Admitting: Internal Medicine

## 2024-02-27 ENCOUNTER — Encounter: Payer: Self-pay | Admitting: Internal Medicine

## 2024-02-27 VITALS — BP 132/78 | HR 56 | Temp 98.1°F | Resp 16 | Ht 73.0 in | Wt 237.1 lb

## 2024-02-27 DIAGNOSIS — Z23 Encounter for immunization: Secondary | ICD-10-CM | POA: Diagnosis not present

## 2024-02-27 DIAGNOSIS — E785 Hyperlipidemia, unspecified: Secondary | ICD-10-CM

## 2024-02-27 DIAGNOSIS — I10 Essential (primary) hypertension: Secondary | ICD-10-CM

## 2024-02-27 DIAGNOSIS — E1140 Type 2 diabetes mellitus with diabetic neuropathy, unspecified: Secondary | ICD-10-CM

## 2024-02-27 DIAGNOSIS — M109 Gout, unspecified: Secondary | ICD-10-CM | POA: Diagnosis not present

## 2024-02-27 DIAGNOSIS — Z7984 Long term (current) use of oral hypoglycemic drugs: Secondary | ICD-10-CM

## 2024-02-27 LAB — MICROALBUMIN / CREATININE URINE RATIO
Creatinine,U: 60 mg/dL
Microalb Creat Ratio: 41.6 mg/g — ABNORMAL HIGH (ref 0.0–30.0)
Microalb, Ur: 2.5 mg/dL — ABNORMAL HIGH (ref 0.0–1.9)

## 2024-02-27 LAB — LIPID PANEL
Cholesterol: 143 mg/dL (ref 0–200)
HDL: 52.7 mg/dL (ref >39.00)
LDL Cholesterol: 67 mg/dL (ref 0–99)
NonHDL: 90.43
Total CHOL/HDL Ratio: 3
Triglycerides: 116 mg/dL (ref 0.0–149.0)
VLDL: 23.2 mg/dL (ref 0.0–40.0)

## 2024-02-27 LAB — HEMOGLOBIN A1C: Hgb A1c MFr Bld: 7 % — ABNORMAL HIGH (ref 4.6–6.5)

## 2024-02-27 LAB — AST: AST: 14 U/L (ref 0–37)

## 2024-02-27 LAB — ALT: ALT: 14 U/L (ref 0–53)

## 2024-02-27 NOTE — Progress Notes (Signed)
 Subjective:    Patient ID: Barry Bush, male    DOB: 1960/09/19, 63 y.o.   MRN: [id number]  DOS:  02/27/2024 Type of visit - description: Follow-up  Feeling well.  Chronic medical problems addressed. Has no concerns.   Review of Systems See above   Past Medical History:  Diagnosis Date   Diabetes mellitus 08/26/2010   Esophageal stricture 06/27/2001   s/p dilation   Eustachian tube disorder 10/26/2014   Dr. Burnie   GERD (gastroesophageal reflux disease)    Gout    Hypertension    PSORIASIS 10/16/2006   Qualifier: Diagnosis of  By: Lavell Orf      Past Surgical History:  Procedure Laterality Date   Facial Trauma      Current Outpatient Medications  Medication Instructions   allopurinol  (ZYLOPRIM ) 100 mg, Oral, Daily   ALPHA LIPOIC ACID PO 600 mg   amLODipine  (NORVASC ) 10 mg, Oral, Daily   aspirin 81 mg, Daily   atorvastatin  (LIPITOR) 20 mg, Oral, Daily at bedtime   Blood Glucose Monitoring Suppl (FREESTYLE LITE) w/Device KIT 1 Device, Does not apply, As directed, Check blood sugars twice daily   calcipotriene (DOVONOX) 0.005 % cream 2 times daily   cyanocobalamin  (VITAMIN B12) 1,000 mcg, Oral, Daily   glipiZIDE  (GLIPIZIDE  XL) 2.5 mg, Oral, Daily with breakfast   glucose blood (FREESTYLE LITE) test strip Check blood sugars twice daily   hydrochlorothiazide (HYDRODIURIL) 25 mg, Oral, Daily   Lancets (FREESTYLE) lancets Check blood sugars twice daily   loratadine  (CLARITIN ) 10 MG tablet TAKE 1 TABLET BY MOUTH DAILY.   losartan  (COZAAR ) 100 mg, Oral, Daily   metFORMIN  (GLUCOPHAGE ) 850 MG tablet TAKE 2 TABLETS BY MOUTH DAILY WITH BREAKFAST AND 1 TABLET (850 MG TOTAL) DAILY WITH SUPPER.   metoprolol  tartrate (LOPRESSOR ) 100 mg, Oral, 2 times daily   pioglitazone  (ACTOS ) 45 mg, Oral, Daily   sildenafil  (REVATIO ) 20 MG tablet TAKE 4 TO 5 TABLETS BY MOUTH AT BEDTIME AS NEEDED       Objective:   Physical Exam BP 132/78   Pulse (!) 56   Temp 98.1 F  (36.7 C) (Oral)   Resp 16   Ht 6' 1 (1.854 m)   Wt 237 lb 2 oz (107.6 kg)   SpO2 97%   BMI 31.28 kg/m  General:   Well developed, NAD, BMI noted. HEENT:  Normocephalic . Face symmetric, atraumatic Lungs:  CTA B Normal respiratory effort, no intercostal retractions, no accessory muscle use. Heart: RRR,  no murmur.  Lower extremities: no pretibial edema bilaterally  Skin: Not pale. Not jaundice Neurologic:  alert & oriented X3.  Speech normal, gait appropriate for age and unassisted Psych--  Cognition and judgment appear intact.  Cooperative with normal attention span and concentration.  Behavior appropriate. No anxious or depressed appearing.      Assessment    Problem list: DM  dx 2012 Mild neuropathy dx 10-2015, saw neuro, 08-2020, extensive blood work (-) NCS confirmed mild neuropathy, likely DM related. HTN GERD Esophageal stricture, dilatation 2003 Gout (had to start HCTZ around 2021 due to uncontrolled BP) Psoriasis  B12 deficiency DX 02-2018 + FH CAD + Lyme serology 01/2022 , d/w  ID, Rx doxycycline   PLAN:  DM: At the last visit glipizide  was added to metformin  and pioglitazone .  Ambulatory CBGs running from 102-125.  Check A1c and micro. DM neuropathy: sxs at baseline. HTN: Ambulatory BPs reportedly normal, on amlodipine , HCTZ, losartan , metoprolol .  Last BMP okay.  No  change High cholesterol: On atorvastatin  20 mg, check FLP AST ALT Gout: No recent events, on allopurinol , checking LFTs.  Last uric acid okay. Vaccine advice: Flu shot today, consider a COVID booster. RTC 4 months CPX

## 2024-02-27 NOTE — Patient Instructions (Signed)
 You got your flu shot today.  Recommend a COVID booster this fall  Continue checking your blood sugars and blood pressure regularly Blood pressure goal:  between 110/65 and  135/85. Your blood sugar goals: - early in AM fasting  ( blood sugar goal 70-130) - 2 hours after a meal (blood sugar goal less than 180)     GO TO THE LAB :  Get the blood work   Your results will be posted on MyChart with my comments  Go to the front desk for the checkout Please make an appointment for a physical exam by 06-2024

## 2024-02-27 NOTE — Assessment & Plan Note (Signed)
 DM: At the last visit glipizide  was added to metformin  and pioglitazone .  Ambulatory CBGs running from 102-125.  Check A1c and micro. DM neuropathy: sxs at baseline. HTN: Ambulatory BPs reportedly normal, on amlodipine , HCTZ, losartan , metoprolol .  Last BMP okay.  No change High cholesterol: On atorvastatin  20 mg, check FLP AST ALT Gout: No recent events, on allopurinol , checking LFTs.  Last uric acid okay. Vaccine advice: Flu shot today, consider a COVID booster. RTC 4 months CPX

## 2024-02-28 ENCOUNTER — Ambulatory Visit: Payer: Self-pay | Admitting: Internal Medicine

## 2024-02-28 MED ORDER — GLIPIZIDE ER 5 MG PO TB24: 5.0000 mg | ORAL_TABLET | Freq: Every day | ORAL | 1 refills | Status: AC

## 2024-03-19 DIAGNOSIS — C44519 Basal cell carcinoma of skin of other part of trunk: Secondary | ICD-10-CM | POA: Diagnosis not present

## 2024-03-19 DIAGNOSIS — D2272 Melanocytic nevi of left lower limb, including hip: Secondary | ICD-10-CM | POA: Diagnosis not present

## 2024-03-19 DIAGNOSIS — D2262 Melanocytic nevi of left upper limb, including shoulder: Secondary | ICD-10-CM | POA: Diagnosis not present

## 2024-03-19 DIAGNOSIS — D225 Melanocytic nevi of trunk: Secondary | ICD-10-CM | POA: Diagnosis not present

## 2024-03-19 DIAGNOSIS — Z85828 Personal history of other malignant neoplasm of skin: Secondary | ICD-10-CM | POA: Diagnosis not present

## 2024-03-19 DIAGNOSIS — D2271 Melanocytic nevi of right lower limb, including hip: Secondary | ICD-10-CM | POA: Diagnosis not present

## 2024-03-19 DIAGNOSIS — L400 Psoriasis vulgaris: Secondary | ICD-10-CM | POA: Diagnosis not present

## 2024-03-19 DIAGNOSIS — L570 Actinic keratosis: Secondary | ICD-10-CM | POA: Diagnosis not present

## 2024-03-25 ENCOUNTER — Other Ambulatory Visit: Payer: Self-pay | Admitting: Internal Medicine

## 2024-03-25 DIAGNOSIS — C4491 Basal cell carcinoma of skin, unspecified: Secondary | ICD-10-CM | POA: Insufficient documentation

## 2024-04-03 ENCOUNTER — Other Ambulatory Visit: Payer: Self-pay | Admitting: Internal Medicine

## 2024-04-22 ENCOUNTER — Other Ambulatory Visit: Payer: Self-pay | Admitting: Internal Medicine

## 2024-04-29 ENCOUNTER — Other Ambulatory Visit: Payer: Self-pay | Admitting: Internal Medicine

## 2024-05-01 DIAGNOSIS — E119 Type 2 diabetes mellitus without complications: Secondary | ICD-10-CM | POA: Diagnosis not present

## 2024-05-01 LAB — OPHTHALMOLOGY REPORT-SCANNED

## 2024-05-28 ENCOUNTER — Other Ambulatory Visit: Payer: Self-pay | Admitting: Internal Medicine

## 2024-06-28 ENCOUNTER — Encounter: Payer: Self-pay | Admitting: Internal Medicine

## 2024-06-28 ENCOUNTER — Ambulatory Visit (INDEPENDENT_AMBULATORY_CARE_PROVIDER_SITE_OTHER): Admitting: Internal Medicine

## 2024-06-28 VITALS — BP 131/75 | HR 51 | Temp 97.9°F | Resp 16 | Ht 73.0 in | Wt 246.0 lb

## 2024-06-28 DIAGNOSIS — Z0000 Encounter for general adult medical examination without abnormal findings: Secondary | ICD-10-CM

## 2024-06-28 DIAGNOSIS — E538 Deficiency of other specified B group vitamins: Secondary | ICD-10-CM

## 2024-06-28 DIAGNOSIS — Z0001 Encounter for general adult medical examination with abnormal findings: Secondary | ICD-10-CM

## 2024-06-28 DIAGNOSIS — Z23 Encounter for immunization: Secondary | ICD-10-CM

## 2024-06-28 DIAGNOSIS — M109 Gout, unspecified: Secondary | ICD-10-CM | POA: Diagnosis not present

## 2024-06-28 DIAGNOSIS — E785 Hyperlipidemia, unspecified: Secondary | ICD-10-CM | POA: Diagnosis not present

## 2024-06-28 DIAGNOSIS — E1140 Type 2 diabetes mellitus with diabetic neuropathy, unspecified: Secondary | ICD-10-CM | POA: Diagnosis not present

## 2024-06-28 DIAGNOSIS — Z7984 Long term (current) use of oral hypoglycemic drugs: Secondary | ICD-10-CM | POA: Diagnosis not present

## 2024-06-28 DIAGNOSIS — I10 Essential (primary) hypertension: Secondary | ICD-10-CM

## 2024-06-28 LAB — CBC WITH DIFFERENTIAL/PLATELET
Basophils Absolute: 0.1 K/uL (ref 0.0–0.1)
Basophils Relative: 0.7 % (ref 0.0–3.0)
Eosinophils Absolute: 0.4 K/uL (ref 0.0–0.7)
Eosinophils Relative: 4.8 % (ref 0.0–5.0)
HCT: 35.4 % — ABNORMAL LOW (ref 39.0–52.0)
Hemoglobin: 12.1 g/dL — ABNORMAL LOW (ref 13.0–17.0)
Lymphocytes Relative: 17.6 % (ref 12.0–46.0)
Lymphs Abs: 1.3 K/uL (ref 0.7–4.0)
MCHC: 34.1 g/dL (ref 30.0–36.0)
MCV: 92.9 fl (ref 78.0–100.0)
Monocytes Absolute: 0.6 K/uL (ref 0.1–1.0)
Monocytes Relative: 8.2 % (ref 3.0–12.0)
Neutro Abs: 5.3 K/uL (ref 1.4–7.7)
Neutrophils Relative %: 68.7 % (ref 43.0–77.0)
Platelets: 273 K/uL (ref 150.0–400.0)
RBC: 3.81 Mil/uL — ABNORMAL LOW (ref 4.22–5.81)
RDW: 13 % (ref 11.5–15.5)
WBC: 7.7 K/uL (ref 4.0–10.5)

## 2024-06-28 LAB — BASIC METABOLIC PANEL WITH GFR
BUN: 23 mg/dL (ref 6–23)
CO2: 30 meq/L (ref 19–32)
Calcium: 9.4 mg/dL (ref 8.4–10.5)
Chloride: 99 meq/L (ref 96–112)
Creatinine, Ser: 1.27 mg/dL (ref 0.40–1.50)
GFR: 60.71 mL/min
Glucose, Bld: 106 mg/dL — ABNORMAL HIGH (ref 70–99)
Potassium: 4.3 meq/L (ref 3.5–5.1)
Sodium: 139 meq/L (ref 135–145)

## 2024-06-28 LAB — VITAMIN B12: Vitamin B-12: >1500 pg/mL — ABNORMAL HIGH (ref 211–911)

## 2024-06-28 LAB — PSA: PSA: 1.3 ng/mL (ref 0.10–4.00)

## 2024-06-28 LAB — HEMOGLOBIN A1C: Hgb A1c MFr Bld: 6.5 % (ref 4.6–6.5)

## 2024-06-28 MED ORDER — HYDROCHLOROTHIAZIDE 25 MG PO TABS: 25.0000 mg | ORAL_TABLET | Freq: Every day | ORAL | 5 refills | Status: AC

## 2024-06-28 NOTE — Patient Instructions (Signed)
 GO TO THE LAB :  Get the blood work    Then, go to the front desk for the checkout Please make an appointment for a checkup in 6 months, come back sooner if needed   You got a pneumonia shot today  Continue checking your blood pressure regularly Blood pressure goal:  between 110/65 and  135/85. If it is consistently higher or lower, let me know     TYPE 2 DIABETES MELLITUS WITH MILD NEUROPATHY:  Your neuropathy is managed with Nervive, which you prefer over gabapentin . -Continue taking pioglitazone  45 mg daily. -Continue taking metformin  850 mg, 2 tablets in the morning and 1 tablet in the evening. -Continue taking glipizide  5 mg daily. -Continue taking Nervive for neuropathy management.  GOUT: Your gout is well-managed with allopurinol  and your uric acid levels are normal. -Continue taking allopurinol  100 mg daily.  HYPERTENSION: Your blood pressure is well-controlled with your current medications. -Continue taking losartan , metoprolol , amlodipine , and hydrochlorothiazide as prescribed.  HYPERLIPIDEMIA: Your cholesterol levels are well-controlled with atorvastatin . -Continue taking atorvastatin  20 mg daily.

## 2024-06-28 NOTE — Progress Notes (Signed)
 "  Subjective:    Patient ID: Barry Bush, male    DOB: Jul 10, 1960, 64 y.o.   MRN: [id number]  DOS:  06/28/2024 CPX Discussed the use of AI scribe software for clinical note transcription with the patient, who gave verbal consent to proceed.  History of Present Illness Barry Bush is a 64 year old male who presents for an annual physical exam and chronic medical problems management.  General health status - Feels well without acute complaints - No fever, chest pain, respiratory, gastrointestinal, or urinary symptoms - Remains physically active with frequent walking at work  Diabetes mellitus - Managed with pioglitazone  45 mg daily, metformin  850 mg twice daily, and glipizide  5 mg daily - Home glucose readings recently 113-123 mg/dL - Interested in continuous glucose monitoring but limited by insurance coverage  Gout - Controlled with daily allopurinol  - Stable uric acid levels  Hypertension - Treated with losartan , metoprolol , amlodipine , and hydrochlorothiazide  - Blood pressure readings acceptable  Peripheral neuropathy - Takes B12, vitamin D , multivitamin, and Nervive - [employer] more helpful than prior gabapentin , which was discontinued due to side effects  Review of Systems  Other than above, a 14 point review of systems is negative     Past Medical History:  Diagnosis Date   Diabetes mellitus 08/26/2010   Esophageal stricture 06/27/2001   s/p dilation   Eustachian tube disorder 10/26/2014   Dr. Burnie   GERD (gastroesophageal reflux disease)    Gout    Hypertension    PSORIASIS 10/16/2006   Qualifier: Diagnosis of  By: Lavell Orf      Past Surgical History:  Procedure Laterality Date   Facial Trauma      Current Outpatient Medications  Medication Instructions   allopurinol  (ZYLOPRIM ) 100 mg, Oral, Daily   ALPHA LIPOIC ACID PO 600 mg   amLODipine  (NORVASC ) 10 mg, Oral, Daily   aspirin 81 mg, Daily   atorvastatin  (LIPITOR) 20  MG tablet TAKE 1 TABLET BY MOUTH EVERYDAY AT BEDTIME   Blood Glucose Monitoring Suppl (FREESTYLE LITE) w/Device KIT 1 Device, Does not apply, As directed, Check blood sugars twice daily   calcipotriene (DOVONOX) 0.005 % cream 2 times daily   cyanocobalamin  (VITAMIN B12) 1,000 mcg, Oral, Daily   glipiZIDE  (GLUCOTROL  XL) 5 mg, Oral, Daily with breakfast   glucose blood (FREESTYLE LITE) test strip Check blood sugars twice daily   hydrochlorothiazide  (HYDRODIURIL ) 25 mg, Oral, Daily   Lancets (FREESTYLE) lancets Check blood sugars twice daily   loratadine  (CLARITIN ) 10 MG tablet TAKE 1 TABLET BY MOUTH DAILY.   losartan  (COZAAR ) 100 mg, Oral, Daily   metFORMIN  (GLUCOPHAGE ) 850 MG tablet TAKE 2 TABLETS BY MOUTH DAILY WITH BREAKFAST AND 1 TABLET (850 MG TOTAL) DAILY WITH SUPPER.   metoprolol  tartrate (LOPRESSOR ) 100 mg, Oral, 2 times daily   pioglitazone  (ACTOS ) 45 mg, Oral, Daily   sildenafil  (REVATIO ) 20 MG tablet TAKE 4 TO 5 TABLETS BY MOUTH AT BEDTIME AS NEEDED       Objective:   Physical Exam BP 131/75 (BP Location: Left Arm, Patient Position: Sitting, Cuff Size: Normal)   Pulse (!) 51   Temp 97.9 F (36.6 C) (Oral)   Resp 16   Ht 6' 1 (1.854 m)   Wt 246 lb (111.6 kg)   SpO2 100%   BMI 32.46 kg/m  General: Well developed, NAD, BMI noted Neck: No  thyromegaly  HEENT:  Normocephalic . Face symmetric, atraumatic Lungs:  CTA B Normal respiratory effort, no  intercostal retractions, no accessory muscle use. Heart: RRR,  no murmur.  Abdomen:  Not distended, soft, non-tender. No rebound or rigidity.   Lower extremities: no pretibial edema bilaterally  Skin: Exposed areas without rash. Not pale. Not jaundice Neurologic:  alert & oriented X3.  Speech normal, gait appropriate for age and unassisted Strength symmetric and appropriate for age.  Psych: Cognition and judgment appear intact.  Cooperative with normal attention span and concentration.  Behavior appropriate. No anxious  or depressed appearing.     Assessment   Problem list: DM  dx 2012 Mild neuropathy dx 10-2015, saw neuro, 08-2020, extensive blood work (-) NCS confirmed mild neuropathy, likely DM related. HTN GERD Esophageal stricture, dilatation 2003 Gout (had to start HCTZ around 2021 due to uncontrolled BP) Psoriasis  B12 deficiency DX 02-2018 + FH CAD + Lyme serology 01/2022 , d/w  ID, Rx doxycycline    Assessment & Plan Here for CPX - Td 07/25/2023 -  PNM 2015; PNM 13: 2016; PNM today - s/p shingrix   -  had a flu shot - rec covid booster, pro>cons.    -- CCS:  cscope 04-2013, C-scope 09/13/2023 Dr. Kristie, next per GI --Prostate cancer screening: No symptoms, check PSA -- Lifestyle: Very active at work, room for improvement in diet, advice provided. -Labs: BMP CBC A1c PSA B12  Other issues addressed today DM with neuropathy Last A1c at 7% suggests improvement needed.  Glipizide  dose was increased. Neuropathy managed with Nervive, a OTC supplement,  preferred over gabapentin . Continue pioglitazone  45 mg daily.,  Metformin  850 mg, 2 tablets in the morning and 1 tablet in the evening, glipizide  5 mg daily. Check A1c Gout Well-managed with allopurinol . Uric acid levels normal.  No change  HTN Blood pressure well-controlled with current regimen. Check a BMP and continue losartan , metoprolol , amlodipine , and hydrochlorothiazide  as prescribed. B12 deficiency: On MVI, B12 OTC.  Check levels. High cholesterol Cholesterol levels well-controlled with atorvastatin .  RTC 6 months, sooner if needed     "

## 2024-06-30 ENCOUNTER — Encounter: Payer: Self-pay | Admitting: Internal Medicine

## 2024-06-30 NOTE — Assessment & Plan Note (Signed)
 Here for CPX - Td 07/25/2023 -  PNM 2015; PNM 13: 2016; PNM today - s/p shingrix   -  had a flu shot - rec covid booster, pro>cons.    -- CCS:  cscope 04-2013, C-scope 09/13/2023 Dr. Kristie, next per GI --Prostate cancer screening: No symptoms, check PSA -- Lifestyle: Very active at work, room for improvement in diet, advice provided. -Labs: BMP CBC A1c PSA B12

## 2024-06-30 NOTE — Assessment & Plan Note (Signed)
 Here for CPX Other issues addressed today DM with neuropathy Last A1c at 7% suggests improvement needed.  Glipizide  dose was increased. Neuropathy managed with Nervive, a OTC supplement,  preferred over gabapentin . Continue pioglitazone  45 mg daily.,  Metformin  850 mg, 2 tablets in the morning and 1 tablet in the evening, glipizide  5 mg daily. Check A1c Gout Well-managed with allopurinol . Uric acid levels normal.  No change  HTN Blood pressure well-controlled with current regimen. Check a BMP and continue losartan , metoprolol , amlodipine , and hydrochlorothiazide  as prescribed. B12 deficiency: On MVI, B12 OTC.  Check levels. High cholesterol Cholesterol levels well-controlled with atorvastatin .  RTC 6 months, sooner if needed

## 2024-07-02 ENCOUNTER — Ambulatory Visit: Payer: Self-pay | Admitting: Internal Medicine

## 2024-07-06 ENCOUNTER — Other Ambulatory Visit: Payer: Self-pay | Admitting: Internal Medicine

## 2024-10-16 ENCOUNTER — Ambulatory Visit: Admitting: Neurology

## 2024-10-17 ENCOUNTER — Ambulatory Visit: Admitting: Family Medicine

## 2024-12-31 ENCOUNTER — Ambulatory Visit: Admitting: Internal Medicine
